# Patient Record
Sex: Female | Born: 1970 | Race: Black or African American | Hispanic: No | Smoking: Current every day smoker
Health system: Southern US, Community
[De-identification: ages and names within clinical notes are randomized; demographics above are authoritative.]

## PROBLEM LIST (undated history)

## (undated) DIAGNOSIS — M1990 Unspecified osteoarthritis, unspecified site: Secondary | ICD-10-CM

## (undated) DIAGNOSIS — J189 Pneumonia, unspecified organism: Secondary | ICD-10-CM

## (undated) DIAGNOSIS — M549 Dorsalgia, unspecified: Secondary | ICD-10-CM

## (undated) DIAGNOSIS — G8929 Other chronic pain: Secondary | ICD-10-CM

## (undated) DIAGNOSIS — E7800 Pure hypercholesterolemia, unspecified: Secondary | ICD-10-CM

## (undated) DIAGNOSIS — M109 Gout, unspecified: Secondary | ICD-10-CM

## (undated) DIAGNOSIS — G4730 Sleep apnea, unspecified: Secondary | ICD-10-CM

## (undated) DIAGNOSIS — I509 Heart failure, unspecified: Secondary | ICD-10-CM

## (undated) DIAGNOSIS — J40 Bronchitis, not specified as acute or chronic: Secondary | ICD-10-CM

## (undated) DIAGNOSIS — M2550 Pain in unspecified joint: Secondary | ICD-10-CM

## (undated) HISTORY — PX: FOOT SURGERY: SHX648

## (undated) HISTORY — PX: KNEE ARTHROSCOPY: SUR90

## (undated) HISTORY — DX: Pain in unspecified joint: M25.50

## (undated) HISTORY — PX: ABDOMINAL HYSTERECTOMY: SHX81

## (undated) HISTORY — DX: Sleep apnea, unspecified: G47.30

## (undated) HISTORY — PX: ANKLE SURGERY: SHX546

## (undated) HISTORY — PX: ORIF ANKLE FRACTURE: SUR919

## (undated) HISTORY — PX: FRACTURE SURGERY: SHX138

## (undated) HISTORY — DX: Other chronic pain: G89.29

## (undated) HISTORY — PX: CHOLECYSTECTOMY: SHX55

## (undated) HISTORY — DX: Pure hypercholesterolemia, unspecified: E78.00

## (undated) HISTORY — PX: KNEE SURGERY: SHX244

---

## 2005-09-27 ENCOUNTER — Ambulatory Visit: Payer: Self-pay | Admitting: *Deleted

## 2005-09-27 ENCOUNTER — Inpatient Hospital Stay (HOSPITAL_COMMUNITY): Admission: EM | Admit: 2005-09-27 | Discharge: 2005-09-27 | Payer: Self-pay | Admitting: Emergency Medicine

## 2006-06-19 ENCOUNTER — Emergency Department (HOSPITAL_COMMUNITY): Admission: EM | Admit: 2006-06-19 | Discharge: 2006-06-19 | Payer: Self-pay | Admitting: Emergency Medicine

## 2007-07-14 ENCOUNTER — Emergency Department (HOSPITAL_COMMUNITY): Admission: EM | Admit: 2007-07-14 | Discharge: 2007-07-14 | Payer: Self-pay | Admitting: Emergency Medicine

## 2008-09-05 ENCOUNTER — Other Ambulatory Visit: Admission: RE | Admit: 2008-09-05 | Discharge: 2008-09-05 | Payer: Self-pay | Admitting: Family Medicine

## 2008-10-24 ENCOUNTER — Inpatient Hospital Stay (HOSPITAL_COMMUNITY): Admission: EM | Admit: 2008-10-24 | Discharge: 2008-10-26 | Payer: Self-pay | Admitting: Emergency Medicine

## 2008-10-25 ENCOUNTER — Encounter (INDEPENDENT_AMBULATORY_CARE_PROVIDER_SITE_OTHER): Payer: Self-pay | Admitting: General Surgery

## 2008-11-14 ENCOUNTER — Encounter (INDEPENDENT_AMBULATORY_CARE_PROVIDER_SITE_OTHER): Payer: Self-pay | Admitting: Obstetrics and Gynecology

## 2008-11-14 ENCOUNTER — Inpatient Hospital Stay (HOSPITAL_COMMUNITY): Admission: RE | Admit: 2008-11-14 | Discharge: 2008-11-16 | Payer: Self-pay | Admitting: Obstetrics and Gynecology

## 2009-08-13 ENCOUNTER — Emergency Department (HOSPITAL_COMMUNITY): Admission: EM | Admit: 2009-08-13 | Discharge: 2009-08-13 | Payer: Self-pay | Admitting: Emergency Medicine

## 2010-09-10 LAB — COMPREHENSIVE METABOLIC PANEL
AST: 20 U/L (ref 0–37)
Albumin: 3.2 g/dL — ABNORMAL LOW (ref 3.5–5.2)
Alkaline Phosphatase: 66 U/L (ref 39–117)
BUN: 8 mg/dL (ref 6–23)
CO2: 22 mEq/L (ref 19–32)
Calcium: 8.8 mg/dL (ref 8.4–10.5)
Creatinine, Ser: 0.74 mg/dL (ref 0.4–1.2)
Glucose, Bld: 93 mg/dL (ref 70–99)
Potassium: 3.5 mEq/L (ref 3.5–5.1)

## 2010-09-10 LAB — CBC
HCT: 26.3 % — ABNORMAL LOW (ref 36.0–46.0)
HCT: 31.1 % — ABNORMAL LOW (ref 36.0–46.0)
Hemoglobin: 10.3 g/dL — ABNORMAL LOW (ref 12.0–15.0)
Hemoglobin: 8.7 g/dL — ABNORMAL LOW (ref 12.0–15.0)
MCHC: 32.9 g/dL (ref 30.0–36.0)
MCV: 78.1 fL (ref 78.0–100.0)
MCV: 79.1 fL (ref 78.0–100.0)
Platelets: 301 10*3/uL (ref 150–400)
RBC: 3.32 MIL/uL — ABNORMAL LOW (ref 3.87–5.11)
RDW: 17.1 % — ABNORMAL HIGH (ref 11.5–15.5)
WBC: 10.7 10*3/uL — ABNORMAL HIGH (ref 4.0–10.5)

## 2010-09-10 LAB — TYPE AND SCREEN: ABO/RH(D): O POS

## 2010-09-10 LAB — ABO/RH: ABO/RH(D): O POS

## 2010-09-11 LAB — DIFFERENTIAL
Basophils Relative: 0 % (ref 0–1)
Eosinophils Absolute: 0 10*3/uL (ref 0.0–0.7)
Lymphs Abs: 0.9 10*3/uL (ref 0.7–4.0)
Monocytes Absolute: 0.4 10*3/uL (ref 0.1–1.0)
Monocytes Relative: 2 % — ABNORMAL LOW (ref 3–12)

## 2010-09-11 LAB — URINALYSIS, ROUTINE W REFLEX MICROSCOPIC
Bilirubin Urine: NEGATIVE
Ketones, ur: NEGATIVE mg/dL
Specific Gravity, Urine: 1.023 (ref 1.005–1.030)
Urobilinogen, UA: 0.2 mg/dL (ref 0.0–1.0)

## 2010-09-11 LAB — CBC
MCV: 78.3 fL (ref 78.0–100.0)
Platelets: 340 10*3/uL (ref 150–400)
RBC: 4.19 MIL/uL (ref 3.87–5.11)
RDW: 16.3 % — ABNORMAL HIGH (ref 11.5–15.5)
WBC: 17.3 10*3/uL — ABNORMAL HIGH (ref 4.0–10.5)

## 2010-09-11 LAB — COMPREHENSIVE METABOLIC PANEL
AST: 28 U/L (ref 0–37)
Albumin: 3.4 g/dL — ABNORMAL LOW (ref 3.5–5.2)
BUN: 9 mg/dL (ref 6–23)
Creatinine, Ser: 0.85 mg/dL (ref 0.4–1.2)
GFR calc Af Amer: >60 mL/min (ref >60)
Glucose, Bld: 146 mg/dL — ABNORMAL HIGH (ref 70–99)
Total Bilirubin: 0.5 mg/dL (ref 0.3–1.2)

## 2010-09-11 LAB — URINE MICROSCOPIC-ADD ON

## 2010-09-11 LAB — HEMOCCULT GUIAC POC 1CARD (OFFICE): Fecal Occult Bld: POSITIVE

## 2010-10-01 ENCOUNTER — Other Ambulatory Visit (HOSPITAL_COMMUNITY)
Admission: RE | Admit: 2010-10-01 | Discharge: 2010-10-01 | Disposition: A | Discharge status: Home / Self Care | Payer: Self-pay | Source: Ambulatory Visit | Attending: Family Medicine | Admitting: Family Medicine

## 2010-10-01 ENCOUNTER — Other Ambulatory Visit: Payer: Self-pay | Admitting: Family Medicine

## 2010-10-01 DIAGNOSIS — Z01419 Encounter for gynecological examination (general) (routine) without abnormal findings: Secondary | ICD-10-CM | POA: Insufficient documentation

## 2010-10-16 NOTE — Op Note (Signed)
Kelsey Morgan, Kelsey Morgan              ACCOUNT NO.:  [id number]   MEDICAL RECORD NO.:  [id number]          PATIENT TYPE:  INP   LOCATION:  1406                         FACILITY:  [facility]   PHYSICIAN:  Angelia Mould. Derrell Lolling, M.D.DATE OF BIRTH:  [birth year]   DATE OF PROCEDURE:  10/25/2008  DATE OF DISCHARGE:                               OPERATIVE REPORT   PREOPERATIVE DIAGNOSIS:  Acute cholecystitis with cholelithiasis.   POSTOPERATIVE DIAGNOSIS:  Acute cholecystitis with cholelithiasis.   OPERATION PERFORMED:  Laparoscopic cholecystectomy with intraoperative  cholangiogram.   SURGEON:  Angelia Mould. Derrell Lolling, M.D.   OPERATIVE INDICATIONS:  This is a 40 year old African American female  who presented with a 24-hour history of right upper quadrant pain, but  no fever or chills.  She was diagnosed with ulcer disease in 2000 and  had upper endoscopy and takes Nexium and Prilosec.  She came to the  emergency room and was found to have abdominal tenderness.  Lab work  showed a white blood cell count of 17,300, but normal lipase and normal  liver function tests.  Ultrasound showed gallstones and a 2.5-cm  impacted stone with slightly thickened gallbladder wall.  She was  admitted in the middle of the night by Dr. Ovidio Kin.  He asked me to  assume her care.  I discussed this with the patient.  She was in  agreement with this.  We proposed proceeding with gallbladder surgery  today and she was in full agreement that, as she was still having pain.   OPERATIVE FINDINGS:  The gallbladder was acutely inflamed, was  edematous, and contained fairly clear bile.  There was a lot of  inflammatory edema throughout the gallbladder, as well in the  infundibulum.  The duodenum was adherent to the lower part of the  gallbladder, but these were soft adhesions and we were able to slowly  take these down without any problem whatsoever.  The liver, stomach,  duodenum, small intestine, large intestine, and the  peritoneal surfaces  looked normal.   OPERATIVE TECHNIQUE:  Following the induction of general endotracheal  anesthesia, the patient's abdomen was prepped and draped in a sterile  fashion.  Intravenous antibiotics had been given immediately preop.  The  patient was identified as correct patient and correct procedure.  0.5%  Marcaine with epinephrine was used as a local infiltration anesthetic.  A vertically oriented incision was made at the lower rim of the  umbilicus.  The fascia was incised in the midline and the abdominal  cavity entered under direct vision.  A 10-mm Hasson trocar was inserted  and secured with a pursestring suture of 0 Vicryl.  Pneumoperitoneum was  created.  Videocam was inserted with visualization and findings as  described above.  An 11-mm trocar was placed in the subxiphoid region  and two 5-mm trocars placed in the right mid abdomen.   The gallbladder was grasped, but it was fairly tense.  I used a suction  trocar to evacuate the bile from the gallbladder and this partially  collapsed the gallbladder, making it easier to manipulate.  We elevated  the gallbladder.  We slowly took down all of the adhesions to the lower  body and infundibulum of the gallbladder.  We were especially careful to  take down the adhesions of the duodenum and although these were soft  adhesions, they had to be taken down in small sections to avoid injury  to the duodenum.  We ultimately freed up the infundibulum completely and  could then visualize the area of the cystic duct and the cystic artery.  We carefully incised the peritoneum over the cystic duct and cystic  artery both anteriorly and posteriorly and skeletonized these  structures.  We isolated the cystic artery as it went onto the wall of  gallbladder, secured it with multiple metal clips, and divided it.  We  then had a very large window and a good critical view of the cystic  duct.  A cholangiogram catheter was inserted into  the cystic duct.  A  cholangiogram was obtained using the C-arm.  The cholangiogram showed  normal intrahepatic and extrahepatic bile ducts, no filling defect, and  no obstruction with good flow of contrast into the duodenum.  The  cholangiogram catheter was removed, the cystic duct secured with  multiple metal clips and divided.  The gallbladder was then dissected  from its bed with electrocautery, placed in a specimen bag, and removed  through the umbilical port.   The operative field was copiously irrigated.  We irrigated the fluid out  of the subphrenic space and the subphrenic space and it was completely  clear.  There was no bleeding nor any bile leak whatsoever at the  completion of the case.  The pneumoperitoneum was released.  The trocars  were removed.  The fascia at the umbilicus was closed with 0 Vicryl  sutures.  The skin incisions were closed with subcuticular sutures of 4-  0 Monocryl and Steri-Strips.  Clean bandages were placed and the patient  taken to the recovery room in stable condition.  Estimated blood loss  was about 20 mL.  Complications:  None.  Sponge, needle, and instrument  counts were correct.      Angelia Mould. Derrell Lolling, M.D.  Electronically Signed     HMI/MEDQ  D:  10/25/2008  T:  10/26/2008  Job:  [id number]   cc:   Deatra James, M.D.  Fax: [phone]

## 2010-10-16 NOTE — Op Note (Signed)
Kelsey Morgan, Kelsey Morgan              ACCOUNT NO.:  [id number]   MEDICAL RECORD NO.:  [id number]          PATIENT TYPE:  INP   LOCATION:  9306                          FACILITY:  [facility]   PHYSICIAN:  Charles A. Delcambre, MDDATE OF BIRTH:  01-08-1971   DATE OF PROCEDURE:  11/14/2008  DATE OF DISCHARGE:                               OPERATIVE REPORT   PREOPERATIVE DIAGNOSES:  1. Menorrhagia.  2. An 18-week uterine leiomyomata.   POSTOPERATIVE DIAGNOSES:  1. Menorrhagia.  2. An 18-week uterine leiomyomata.   PROCEDURE:  Transabdominal hysterectomy.   SURGEON:  Charles A. Sydnee Cabal, MD   ASSISTANTS:  Gerald Leitz, MD   ANESTHESIA:  General by endotracheal route.   FINDINGS:  Multiple uterine leiomyomata.  Normal tubes, ovaries, and  appendix.  Uterus multiple lobulated pedunculated into intramural  leiomyomata noted on specimen.  Uterus to pathology weighed in the  operating room, 677 g.   ESTIMATED BLOOD LOSS:  200 mL.  Instrument, sponge, and needle count  correct x2.  The uterus to Pathology.   DESCRIPTION OF PROCEDURE:  The patient was taken to the operating room,  placed supine position.  General anesthetic was induced without  difficulty.  Sterile prep and drape was undertaken.  Pfannenstiel  incision was made with the knife and carried down to the fascia.  Fascia  was incised with knife and Mayo scissors.  Rectus sheath were sharply  dissected superiorly and inferiorly.  Rectus muscles were bluntly  dissected in the midline.  Peritoneum was entered with a hemostat  without damage to bowel, bladder, or vascular structures.  Metzenbaum  scissors were used to take the incision superiorly and inferiorly  without damage to the bladder.  Balfour retractor was placed.  Cornual  regions of the uterus were grasped as the uterus was somewhat mobile.  Three laps were used to pack the bowel out the pelvis.  The round  ligaments were transected on either side and vesicouterine  peritoneum  was incised anteriorly.  Round ligaments were held.  Broad ligaments  were penetrated and the utero-ovarian pedicle was isolated, transected  to free tied with 0 Vicryl, and then transfixion stitched with 0 Vicryl  preserving the ovary.  This was done on the right side as well and the  bladder was taken down with sharp dissection and some blunt dissection  anteriorly.  This allowed the uterine vessels to be skeletonized and  they were taken with bleeding clamps on either side.  Further clamps  down to the remainder of the cardinal ligaments included the curved  Zeppelin clamp placed twice and this entered vagina.  Cervix was  amputated from the vagina with Mayo scissors.  Richardson angle sutures  x2 were placed at the vaginal angles.  The vaginal cuff closure was  undertaken with 0 Vicryl in a running locking suture.  Irrigation was  carried out.  Hemostasis was adequate after minor electrocautery in the  bladder edge.  Two figure-of-eight sutures of 2-0 Vicryl were placed on  the bladder to achieve hemostasis as well and the area was bleeding  where the dissection has had been  along the posterior cuff and this was  closed with 2-0 Vicryl transfixion stitches and figure-of-eights with 2-  0 Vicryl.  There was some bleeding from the right ovarian pedicle  beneath the pedicle and this was isolated and with 3 stitches of 2-0  Vicryl.  This area was of good hemostasis.  Irrigation was carried out a  total of 4 times and the subfascial hemostasis was excellent.  Minor  electrocautery was used anteriorly.  The fascia was then closed with #1  PDS running nonlocking suture.  Hemostasis was excellent and 0 plain gut  was used.  An interrupted sutures to place to close the subcutaneous  layer after irrigation was carried out.  Hemostasis was good.  Skin was  closed with sterile staples.  The patient was taken to recovery with  physician in attendance having tolerated the procedure well.   After  irrigation of the sutures, the retractors and laps were taken out,  please add that at that point and then closure ensued.      Charles A. Sydnee Cabal, MD  Electronically Signed     CAD/MEDQ  D:  11/14/2008  T:  11/15/2008  Job:  [id number]

## 2010-10-16 NOTE — H&P (Signed)
Kelsey Morgan, [name]              ACCOUNT NO.:  [id number]   MEDICAL RECORD NO.:  [id number]          PATIENT TYPE:  INP   LOCATION:  1406                         FACILITY:  [facility]   PHYSICIAN:  Sandria Bales. Ezzard Standing, M.D.  DATE OF BIRTH:  25-Oct-1970   DATE OF ADMISSION:  10/24/2008  DATE OF DISCHARGE:                              HISTORY & PHYSICAL   Date of Admission ?   PRIMARY CARE DOCTOR:  Deatra James.   HISTORY OF ILLNESS:  Ms. Kelsey Morgan is a 40 year old black  female patient of Deatra James, she has had about a 24 hours history of  significant epigastric right upper quadrant pain with some vomiting.  She came to the [facility] Emergency Room at approximately 11:00 a.m.  this morning.  Her evaluation has revealed a probable impacted gallstone  in the neck of her gallbladder with a thickened gallbladder wall and  elevated white blood count, all consistent with probably acute  cholecystitis.   The patient has a remote history of ulcer disease.  She says she  underwent upper endoscopy in about 2000.  She has tried both Nexium and  Prilosec to control her ulcer disease.  She can not remember the name of  the doctor who did the endoscopy.  She had some appointments to see GI  doctors in the past for some recurrent epigastric pain but every time  she felt better she cancelled the appointment.   She denies any known history of liver disease, pancreatic disease, colon  disease and she has never had any prior abdominal surgery.   She is scheduled to see Dr. __________ tomorrow for consideration of  hysterectomy.   PAST MEDICAL HISTORY:  SHE HAS NO ALLERGIES.   CURRENT MEDICATIONS:  Prilosec, and her sister had some Phenergan that  she was given but she is on no other chronic medicine.   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   PULMONARY:  Has no pneumonia or  tuberculosis, does not smoke cigarettes.  CARDIAC:  She has had no heart disease, chest pain or hypertension or  cardiac catheterization evaluation.  GASTROINTESTINAL:  See history of present illness.  RENAL: No history of  kidney infections or kidney stones.   GYN:  She has no children and she is considering having a hysterectomy.   PERSONAL HISTORY:  Her mother, father and sister are in the room with  her.  I did her sister's gallbladder surgery about 3 or 4 years ago and  she works as a Naval architect.   PHYSICAL EXAMINATION:  Her temperature is 98.1, blood pressure 123/76,  pulse is 73.  GENERAL:  She is a well-nourished, mildly obese black female who is  cooperative on exam.  HEENT:  Unremarkable.  NECK:  Supple, I found no mass or thyromegaly.  LYMPH NODES: She has no palpable lymph nodes, no cervical or  supraclavicular adenopathy.  LUNGS:  Show symmetric breath sounds.  I did not  examine her breasts.  HEART:  Regular rate and rhythm, I hear no murmur or rub.  ABDOMEN:  She is tender in the right upper quadrant with some guarding.  She has present but decreased bowel sounds.  She has no abdominal mass,  no hernia that I can feel.  EXTREMITIES:  Has good strength in all 4 extremities.  NEUROLOGIC:  Is grossly intact to motor and sensory functions.   I reviewed her ultrasound which does appear to show a 2.5 centimeter  stone impacted in the neck of the gallbladder with thickened gallbladder  wall.   Her urinalysis was negative.   Her serum pregnancy negative.   Her lipase was 10.   Her white blood count was 17,300 with 92% neutrophils, her hemoglobin  was 10, hematocrit 32, her sodium was 140, potassium 3.5, chloride of  109, CO2 of 20, glucose of 146, BUN of 9, creatinine 0.85, her SGPT was  13, SGOT was 28.   IMPRESSION:  1. Acute cholecystitis with cholelithiasis.  I discussed with the      patient about gallbladder surgery, I  discussed the indications,      potential complications.  Potential complications include, but are      not limited to, bleeding, infection, common bile duct injury and      the possibility of open surgery.  I told her we do not know until      we get going about how long she will be in the hospital or her      postoperative course because of the uncertainty of the severity of      the illness and the type of surgery she will get.   I am on call tonight but tomorrow my partner is coming on.  I will  probably have my partner do the surgery since I will have been  up much  of this night.  I think she understands this with the indications of her surgery and who  will be doing the surgery.   1. History of stress induced stroke, she is 100% recovered.  2. Anticipating hysterectomy.      Sandria Bales. Ezzard Standing, M.D.  Electronically Signed     DHN/MEDQ  D:  10/24/2008  T:  10/25/2008  Job:  [id number]   cc:   Deatra James, M.D.  Fax: [phone]

## 2010-10-19 NOTE — Cardiovascular Report (Signed)
Kelsey Morgan, Kelsey Morgan              ACCOUNT NO.:  [id number]   MEDICAL RECORD NO.:  [id number]          PATIENT TYPE:  INP   LOCATION:  1831                         FACILITY:  [facility]   PHYSICIAN:  Salvadore Farber, M.D. LHCDATE OF BIRTH:  1970-08-15   DATE OF PROCEDURE:  09/27/2005  DATE OF DISCHARGE:                              CARDIAC CATHETERIZATION   PROCEDURE:  1.  Left heart catheterization.  2.  Left ventriculography.  3.  Coronary angiography.  4.  StarClose closure of the right common femoral arteriotomy site.   INDICATIONS:  [name] is a 40 year old woman without prior history of  cardiovascular disease and no risk factors who presents with acute onset  chest discomfort at 9:30 p.m.  Pain persisted at 10 out of 10 prompting  presentation to the [facility] emergency Room.  There, electrocardiogram  demonstrated an absence of R-wave progression across the anterior wall and  0.5 mm to 1 mm ST elevations in V1-V5.  It was not clear that she was having  a myocardial infarction, decision was made to proceed to urgent  catheterization for definitive exclusion of myocardial infarction as the  etiology of her chest pain and abnormal electrocardiogram.   PROCEDURAL TECHNIQUE:  Informed consent was obtained.  Under 1% lidocaine  local anesthesia, a 6-French sheath placed in the right common femoral  artery using the modified Seldinger technique.  Diagnostic angiography and  ventriculography were performed using JL4, JR4, and pigtail catheters.  Pigtail catheter was then pulled back to the aortic root and root  aortography performed by power injection.  The arteriotomy was then closed  using a StarClose device.  Complete hemostasis was obtained.  She was  transferred to the holding room in stable condition.   COMPLICATIONS:  None.   FINDINGS:  1.  LV:  114/13/20.  EF 65% without regional wall motion abnormality.  2.  No aortic stenosis or mitral regurgitation.  3.  Left  main:  Angiographically normal.  4.  LAD:  Moderate-sized vessel giving rise to single diagonal.  It is      angiographically normal.  5.  Ramus intermedius:  Large vessel which is angiographically normal.  6.  Circumflex:  Small vessel which is angiographically normal.  7.  RCA:  Moderate-sized dominant vessel which is angiographically normal.  8.  Ascending aorta:  Appears to have bovine origin of the great vessels      with the left vertebral arising directly from the arch.  There is no      evidence of aneurysm or dissection.   IMPRESSION/PLAN:  Patient has angiographically normal coronary arteries,  normal left ventricular size and systolic function, and normal ascending  aorta.  There is no clear explanation for her chest discomfort based on this  study.  Will check D-dimer, though clinical likelihood of pulmonary embolus  is very low.  She will be admitted to the hospital and observed closely.      Salvadore Farber, M.D. [facility]  Electronically Signed     WED/MEDQ  D:  09/27/2005  T:  09/27/2005  Job:  [id number]

## 2010-10-19 NOTE — H&P (Signed)
NAMEBIRGIT, Kelsey Morgan              ACCOUNT NO.:  [id number]   MEDICAL RECORD NO.:  [id number]          PATIENT TYPE:  INP   LOCATION:  2920                         FACILITY:  [facility]   PHYSICIAN:  Vida Roller, M.D.   DATE OF BIRTH:  12/04/1970   DATE OF ADMISSION:  09/27/2005  DATE OF DISCHARGE:                                HISTORY & PHYSICAL   CHIEF COMPLAINT:  Chest pain.   HISTORY OF PRESENT ILLNESS:  This is a 40 year old woman with no past  medical history.  ___________ was in church today __________ but she had the  sudden onset of chest pain at 9:30.  She ignored the pain for approximately  4 hours, however, it remained persistent at 10/10 with associated shortness  of breath, nausea and vomiting.  Finally, at that point she called [facility] and was brought to the emergency room where she was found to have ST  segment elevation in V2 and V3 and complete loss of electrical forces.   PAST MEDICAL HISTORY:  None.   ALLERGIES:  None.   CURRENT MEDICATIONS:  None.   SOCIAL HISTORY:  No tobacco, no alcohol.  No drug use.   REVIEW OF SYSTEMS:  Otherwise unremarkable.   PHYSICAL EXAMINATION:  VITAL SIGNS:  Blood pressure 114/70, pulse 84,  respiratory 18, blood pressure 98/71.  GENERAL:  She is alert and in no apparent distress.  NECK:  Supple.  LUNGS:  Clear.  HEART:  Regular with normal first and second heart sounds.  ABDOMEN:  Soft, nontender.  Positive bowel sounds.  EXTREMITIES:  No cyanosis, clubbing or edema.  NEUROLOGICAL:  Exam is nonfocal.  RECTAL:  Exam is negative for __________.   LABORATORY DATA:  Repeat EKG and point of care cardiac enzymes were pending  .   ASSESSMENT AND PLAN:  This is a 40 year old woman with chest pain with  concern for acute coronary syndrome.  We will proceed with cardiac  catheterization.  Should the cardiac catheterization be negative then other  etiologies of chest pain including musculoskeletal disease, GERD, will  need  to be evaluated.     ______________________________  Denyse Amass, MD      Vida Roller, M.D.  Electronically Signed    DBH/MEDQ  D:  09/27/2005  T:  09/27/2005  Job:  [id number]
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
## 2010-10-19 NOTE — Discharge Summary (Signed)
Kelsey Morgan, Kelsey Morgan              ACCOUNT NO.:  [id number]   MEDICAL RECORD NO.:  [id number]          PATIENT TYPE:  INP   LOCATION:  2920                         FACILITY:  [facility]   PHYSICIAN:  Salvadore Farber, M.D. LHCDATE OF BIRTH:  [birth year]   DATE OF ADMISSION:  09/27/2005  DATE OF DISCHARGE:  09/27/2005                                 DISCHARGE SUMMARY   PROCEDURES:  1.  Cardiac catheterization.  2.  Coronary arteriogram.  3.  Left ventriculogram.  4.  CT of the chest with contrast.   Time at discharge:  Twenty four minutes.   PRIMARY DIAGNOSIS:  Chest pain.   Dictation ended at this point.      Theodore Demark, P.A. [facility]      Salvadore Farber, M.D. [facility]  Electronically Signed    RB/MEDQ  D:  09/27/2005  T:  09/28/2005  Job:  [id number]

## 2010-12-17 ENCOUNTER — Emergency Department (HOSPITAL_COMMUNITY)
Admission: EM | Admit: 2010-12-17 | Discharge: 2010-12-17 | Disposition: A | Discharge status: Home / Self Care | Payer: BC Managed Care – PPO | Attending: Emergency Medicine | Admitting: Emergency Medicine

## 2010-12-17 DIAGNOSIS — J029 Acute pharyngitis, unspecified: Secondary | ICD-10-CM | POA: Insufficient documentation

## 2010-12-17 DIAGNOSIS — F3289 Other specified depressive episodes: Secondary | ICD-10-CM | POA: Insufficient documentation

## 2010-12-17 DIAGNOSIS — R509 Fever, unspecified: Secondary | ICD-10-CM | POA: Insufficient documentation

## 2010-12-17 DIAGNOSIS — F329 Major depressive disorder, single episode, unspecified: Secondary | ICD-10-CM | POA: Insufficient documentation

## 2010-12-17 DIAGNOSIS — R05 Cough: Secondary | ICD-10-CM | POA: Insufficient documentation

## 2010-12-17 DIAGNOSIS — R059 Cough, unspecified: Secondary | ICD-10-CM | POA: Insufficient documentation

## 2010-12-17 DIAGNOSIS — J40 Bronchitis, not specified as acute or chronic: Secondary | ICD-10-CM | POA: Insufficient documentation

## 2011-02-22 LAB — DIFFERENTIAL
Basophils Absolute: 0
Basophils Relative: 0
Eosinophils Absolute: 0.3
Eosinophils Relative: 2
Lymphocytes Relative: 21
Lymphs Abs: 2.7
Monocytes Absolute: 0.7
Monocytes Relative: 5
Neutro Abs: 9.1 — ABNORMAL HIGH
Neutrophils Relative %: 71

## 2011-02-22 LAB — CBC
HCT: 35.2 — ABNORMAL LOW
Hemoglobin: 11.7 — ABNORMAL LOW
MCHC: 33.3
MCV: 82.5
RBC: 4.26

## 2011-02-22 LAB — COMPREHENSIVE METABOLIC PANEL
ALT: 8
AST: 17
Albumin: 3.1 — ABNORMAL LOW
Alkaline Phosphatase: 69
BUN: 7
CO2: 24
Calcium: 8.4
Chloride: 105
Creatinine, Ser: 0.85
GFR calc Af Amer: >60
GFR calc non Af Amer: >60
Glucose, Bld: 87
Potassium: 3.6
Sodium: 135
Total Bilirubin: 0.3
Total Protein: 6.7

## 2011-02-22 LAB — URINE MICROSCOPIC-ADD ON

## 2011-02-22 LAB — LIPASE, BLOOD: Lipase: 14

## 2011-02-22 LAB — URINALYSIS, ROUTINE W REFLEX MICROSCOPIC
Protein, ur: NEGATIVE
pH: 7

## 2011-10-22 ENCOUNTER — Other Ambulatory Visit: Payer: Self-pay | Admitting: Family Medicine

## 2011-10-22 DIAGNOSIS — R202 Paresthesia of skin: Secondary | ICD-10-CM

## 2011-10-22 DIAGNOSIS — M542 Cervicalgia: Secondary | ICD-10-CM

## 2011-11-08 ENCOUNTER — Other Ambulatory Visit: Payer: Self-pay | Admitting: Family Medicine

## 2011-11-08 DIAGNOSIS — M5410 Radiculopathy, site unspecified: Secondary | ICD-10-CM

## 2011-11-11 ENCOUNTER — Other Ambulatory Visit: Payer: BC Managed Care – PPO

## 2011-11-18 ENCOUNTER — Other Ambulatory Visit: Payer: BC Managed Care – PPO

## 2011-11-25 ENCOUNTER — Inpatient Hospital Stay: Admission: RE | Admit: 2011-11-25 | Payer: BC Managed Care – PPO | Source: Ambulatory Visit

## 2011-11-25 ENCOUNTER — Other Ambulatory Visit: Payer: BC Managed Care – PPO

## 2012-06-15 ENCOUNTER — Emergency Department (HOSPITAL_COMMUNITY)
Admission: EM | Admit: 2012-06-15 | Discharge: 2012-06-15 | Disposition: A | Discharge status: Home / Self Care | Payer: BC Managed Care – PPO | Attending: Emergency Medicine | Admitting: Emergency Medicine

## 2012-06-15 ENCOUNTER — Emergency Department (HOSPITAL_COMMUNITY): Payer: BC Managed Care – PPO

## 2012-06-15 ENCOUNTER — Encounter (HOSPITAL_COMMUNITY): Payer: Self-pay | Admitting: Emergency Medicine

## 2012-06-15 DIAGNOSIS — M549 Dorsalgia, unspecified: Secondary | ICD-10-CM

## 2012-06-15 DIAGNOSIS — M545 Low back pain, unspecified: Secondary | ICD-10-CM | POA: Insufficient documentation

## 2012-06-15 DIAGNOSIS — M5430 Sciatica, unspecified side: Secondary | ICD-10-CM | POA: Insufficient documentation

## 2012-06-15 DIAGNOSIS — Z79899 Other long term (current) drug therapy: Secondary | ICD-10-CM | POA: Insufficient documentation

## 2012-06-15 DIAGNOSIS — F17200 Nicotine dependence, unspecified, uncomplicated: Secondary | ICD-10-CM | POA: Insufficient documentation

## 2012-06-15 MED ORDER — HYDROMORPHONE HCL 4 MG PO TABS: 2.0000 mg | ORAL_TABLET | Freq: Four times a day (QID) | ORAL | Status: DC | PRN

## 2012-06-15 MED ORDER — HYDROMORPHONE HCL PF 2 MG/ML IJ SOLN
2.0000 mg | Freq: Once | INTRAMUSCULAR | Status: AC
  Administered 2012-06-15: 2 mg via INTRAMUSCULAR
  Filled 2012-06-15: qty 1

## 2012-06-15 MED ORDER — METHOCARBAMOL 750 MG PO TABS: 750.0000 mg | ORAL_TABLET | Freq: Four times a day (QID) | ORAL | Status: DC

## 2012-06-15 MED ORDER — MELOXICAM 15 MG PO TABS: 15.0000 mg | ORAL_TABLET | Freq: Every day | ORAL | Status: DC
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
## 2012-06-22 NOTE — ED Provider Notes (Signed)
Medical screening examination/treatment/procedure(s) were performed by non-physician practitioner and as supervising physician I was immediately available for consultation/collaboration.   Richardean Canal, MD 06/22/12 [phone]

## 2013-03-13 ENCOUNTER — Encounter (HOSPITAL_COMMUNITY): Payer: Self-pay | Admitting: Emergency Medicine

## 2013-03-13 ENCOUNTER — Emergency Department (HOSPITAL_COMMUNITY)
Admission: EM | Admit: 2013-03-13 | Discharge: 2013-03-13 | Disposition: A | Discharge status: Home / Self Care | Payer: BC Managed Care – PPO | Attending: Emergency Medicine | Admitting: Emergency Medicine

## 2013-03-13 ENCOUNTER — Emergency Department (HOSPITAL_COMMUNITY): Payer: BC Managed Care – PPO

## 2013-03-13 DIAGNOSIS — R197 Diarrhea, unspecified: Secondary | ICD-10-CM | POA: Insufficient documentation

## 2013-03-13 DIAGNOSIS — R509 Fever, unspecified: Secondary | ICD-10-CM | POA: Insufficient documentation

## 2013-03-13 DIAGNOSIS — R112 Nausea with vomiting, unspecified: Secondary | ICD-10-CM | POA: Insufficient documentation

## 2013-03-13 DIAGNOSIS — R05 Cough: Secondary | ICD-10-CM

## 2013-03-13 DIAGNOSIS — J40 Bronchitis, not specified as acute or chronic: Secondary | ICD-10-CM

## 2013-03-13 DIAGNOSIS — R5381 Other malaise: Secondary | ICD-10-CM | POA: Insufficient documentation

## 2013-03-13 DIAGNOSIS — Z79899 Other long term (current) drug therapy: Secondary | ICD-10-CM | POA: Insufficient documentation

## 2013-03-13 DIAGNOSIS — F17200 Nicotine dependence, unspecified, uncomplicated: Secondary | ICD-10-CM | POA: Insufficient documentation

## 2013-03-13 DIAGNOSIS — E669 Obesity, unspecified: Secondary | ICD-10-CM | POA: Insufficient documentation

## 2013-03-13 LAB — CBC WITH DIFFERENTIAL/PLATELET
Eosinophils Relative: 2 % (ref 0–5)
HCT: 42 % (ref 36.0–46.0)
Lymphocytes Relative: 27 % (ref 12–46)
Lymphs Abs: 4.5 10*3/uL — ABNORMAL HIGH (ref 0.7–4.0)
MCV: 81.7 fL (ref 78.0–100.0)
Monocytes Relative: 7 % (ref 3–12)
Platelets: 380 10*3/uL (ref 150–400)
RBC: 5.14 MIL/uL — ABNORMAL HIGH (ref 3.87–5.11)
WBC: 16.6 10*3/uL — ABNORMAL HIGH (ref 4.0–10.5)

## 2013-03-13 LAB — POCT I-STAT, CHEM 8
Chloride: 108 mEq/L (ref 96–112)
Glucose, Bld: 101 mg/dL — ABNORMAL HIGH (ref 70–99)
HCT: 45 % (ref 36.0–46.0)
Hemoglobin: 15.3 g/dL — ABNORMAL HIGH (ref 12.0–15.0)
Potassium: 3.5 mEq/L (ref 3.5–5.1)
Sodium: 142 mEq/L (ref 135–145)

## 2013-03-13 MED ORDER — SODIUM CHLORIDE 0.9 % IV SOLN
Freq: Once | INTRAVENOUS | Status: AC
  Administered 2013-03-13: 22:00:00 via INTRAVENOUS

## 2013-03-13 MED ORDER — FLUCONAZOLE 150 MG PO TABS: 150.0000 mg | ORAL_TABLET | Freq: Once | ORAL | Status: DC

## 2013-03-13 MED ORDER — ALBUTEROL SULFATE HFA 108 (90 BASE) MCG/ACT IN AERS
2.0000 | INHALATION_SPRAY | Freq: Once | RESPIRATORY_TRACT | Status: AC
  Administered 2013-03-13: 2 via RESPIRATORY_TRACT
  Filled 2013-03-13: qty 6.7

## 2013-03-13 MED ORDER — PREDNISONE 20 MG PO TABS
60.0000 mg | ORAL_TABLET | Freq: Once | ORAL | Status: AC
  Administered 2013-03-13: 60 mg via ORAL
  Filled 2013-03-13: qty 3

## 2013-03-13 MED ORDER — IOHEXOL 350 MG/ML SOLN
100.0000 mL | Freq: Once | INTRAVENOUS | Status: AC | PRN
  Administered 2013-03-13: 100 mL via INTRAVENOUS

## 2013-03-13 MED ORDER — ALBUTEROL SULFATE (5 MG/ML) 0.5% IN NEBU
2.5000 mg | INHALATION_SOLUTION | Freq: Once | RESPIRATORY_TRACT | Status: AC
  Administered 2013-03-13: 2.5 mg via RESPIRATORY_TRACT
  Filled 2013-03-13: qty 0.5

## 2013-03-13 MED ORDER — ONDANSETRON HCL 4 MG/2ML IJ SOLN
4.0000 mg | Freq: Once | INTRAMUSCULAR | Status: AC
  Administered 2013-03-13: 4 mg via INTRAVENOUS
  Filled 2013-03-13: qty 2

## 2013-03-13 MED ORDER — AZITHROMYCIN 250 MG PO TABS: ORAL_TABLET | ORAL | Status: DC

## 2013-03-13 MED ORDER — HYDROCOD POLST-CHLORPHEN POLST 10-8 MG/5ML PO LQCR: 5.0000 mL | Freq: Every evening | ORAL | Status: DC | PRN

## 2013-03-13 MED ORDER — IPRATROPIUM BROMIDE 0.02 % IN SOLN
0.5000 mg | Freq: Once | RESPIRATORY_TRACT | Status: AC
  Administered 2013-03-13: 0.5 mg via RESPIRATORY_TRACT
  Filled 2013-03-13: qty 2.5

## 2013-03-13 NOTE — ED Provider Notes (Signed)
CSN: [id number]     Arrival date & time 03/13/13  2026 History   First MD Initiated Contact with Patient 03/13/13 2053     Chief Complaint  Patient presents with  . Cough  . Shortness of Breath   (Consider location/radiation/quality/duration/timing/severity/associated sxs/prior Treatment) HPI Comments: 42 year old female with no significant past medical history presents to the emergency department complaining of cough and congestion x4 weeks, worsening over the past 4 days. Patient states her symptoms began with nasal congestion, and throughout the month moved into her chest, she has been trying to take Mucinex without relief. She feels as if there is weakness in her chest, however she is unable to cough anything up. Admits to associated shortness of breath both at rest and on exertion, subjective fever, chills, nausea, non-bloody, non-bilious vomiting and diarrhea. States she just generally does not feel well. Last episode of vomiting was this morning. Denies any sick contacts. 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 Denies any calf pain or swelling. No history of DVT, blood clots or asthma. She is a smoker.  Patient is a 42 y.o. female presenting with cough and shortness of breath. The history is provided by the patient and a significant other.  Cough Associated symptoms: chills, fever and shortness of breath   Associated symptoms: no chest pain and no rash   Shortness of Breath Associated symptoms: cough, fever and vomiting   Associated symptoms: no chest pain and no rash     History reviewed. No pertinent past medical history. Past Surgical History  Procedure Laterality Date  . Cholecystectomy    . Abdominal hysterectomy    . Ankle surgery    . Knee surgery     No family history on file. History  Substance Use Topics  . Smoking status: Current Every Day Smoker -- 1.00 packs/day    Types: Cigarettes  . Smokeless tobacco: Not on file  . Alcohol Use: No   OB History    Grav Para Term Preterm Abortions TAB SAB Ect Mult Living                 Review of Systems  Constitutional: Positive for fever, chills and fatigue.  HENT: Positive for congestion.   Respiratory: Positive for cough, chest tightness and shortness of breath.   Cardiovascular: Negative for chest pain.  Gastrointestinal: Positive for nausea, vomiting and diarrhea.  Skin: Negative for rash.  All other systems reviewed and are negative.    Allergies  Percocet and Vicodin  Home Medications   Current Outpatient Rx  Name  Route  Sig  Dispense  Refill  . acetaminophen (TYLENOL) 500 MG tablet   Oral   Take 500 mg by mouth every 6 (six) hours as needed for pain.         Marland Kitchen azelastine (ASTEPRO) 137 MCG/SPRAY nasal spray   Nasal   Place 1 spray into the nose daily. Use in each nostril as directed         . guaiFENesin (MUCINEX) 600 MG 12 hr tablet   Oral   Take 1,200 mg by mouth 2 (two) times daily.         Marland Kitchen guaifenesin (ROBITUSSIN) 100 MG/5ML syrup   Oral   Take 200 mg by mouth 3 (three) times daily as needed for cough.         . Pseudoephedrine-APAP-DM (DAYQUIL MULTI-SYMPTOM PO)   Oral   Take 1 capsule by mouth every 6 (six) hours as needed.         Marland Kitchen  sertraline (ZOLOFT) 100 MG tablet   Oral   Take 100 mg by mouth daily.         . traMADol (ULTRAM) 50 MG tablet   Oral   Take 50 mg by mouth every 6 (six) hours as needed for pain.          BP 148/89  Pulse 83  Temp(Src) 98.4 F (36.9 C) (Oral)  Resp 20  SpO2 97% Physical Exam  Nursing note and vitals reviewed. Constitutional: She is oriented to person, place, and time. She appears well-developed and well-nourished. No distress.  Obese  HENT:  Head: Normocephalic and atraumatic.  Mouth/Throat: Uvula is midline and mucous membranes are normal. Posterior oropharyngeal erythema present. No oropharyngeal exudate or posterior oropharyngeal edema.  Eyes: Conjunctivae are normal.  Neck: Normal range of  motion. Neck supple.  Cardiovascular: Normal rate, regular rhythm and normal heart sounds.   Pulmonary/Chest: Effort normal. No accessory muscle usage. No respiratory distress.  Harsh cough present. Scattered expiratory> expiratory wheezes and rhonchi.  Abdominal: Soft. Bowel sounds are normal. There is no tenderness.  Musculoskeletal: Normal range of motion. She exhibits no edema.  Neurological: She is alert and oriented to person, place, and time.  Skin: Skin is warm and dry. No rash noted. She is not diaphoretic.  Psychiatric: She has a normal mood and affect. Her behavior is normal.    ED Course  Procedures (including critical care time) Labs Review Labs Reviewed  POCT I-STAT, CHEM 8 - Abnormal; Notable for the following:    BUN 4 (*)    Glucose, Bld 101 (*)    Hemoglobin 15.3 (*)    All other components within normal limits  CBC WITH DIFFERENTIAL  D-DIMER, QUANTITATIVE   Imaging Review Dg Chest 2 View  03/13/2013   CLINICAL DATA:  Cough, shortness of breath.  EXAM: CHEST  2 VIEW  COMPARISON:  September 27, 2005.  FINDINGS: The heart size and mediastinal contours are within normal limits. Bilateral peribronchial thickening is noted consistent with bronchiolitis or asthma. The visualized skeletal structures are unremarkable.  IMPRESSION: Bilateral peribronchial thickening consistent with bronchiolitis or asthma.   Electronically Signed   By: Roque Lias M.D.   On: 03/13/2013 21:07   Ct Angio Chest Pe W/cm &/or Wo Cm  03/13/2013   CLINICAL DATA:  Cough and chest congestion. Shortness of breath. Chest pain.  EXAM: CT ANGIOGRAPHY CHEST WITH CONTRAST  TECHNIQUE: Multidetector CT imaging of the chest was performed using the standard protocol during bolus administration of intravenous contrast. Multiplanar CT image reconstructions including MIPs were obtained to evaluate the vascular anatomy.  CONTRAST:  OMNIPAQUE IOHEXOL 350 MG/ML SOLN  COMPARISON:  Chest radiographs obtained earlier  today and chest CTA dated 09/27/2005.  FINDINGS: Normally opacified pulmonary arteries with no pulmonary arterial filling defects seen. Mild bilateral bullous changes. Small left lower lobe nodule at the left lateral costophrenic angle, measuring 10 x 7 mm on image number 66. This is unchanged. No new lung nodules.  Interval mildly enlarged mediastinal and bilateral hilar lymph nodes. These include a 15 mm short axis right hilar lymph node on image number 34 of series 4, an 8 mm short axis left hilar lymph node on image number 40 of series 4 and a 14 mm periaortic lymph node lateral to the aortic arch on image number 24 of series 4. There is also a prominent subcarinal node with a short axis diameter of 20 mm on image number 33 of series 4.  The upper abdomen is unremarkable. Mild thoracic spine degenerative changes.  Review of the MIP images confirms the above findings.  IMPRESSION: 1. No pulmonary emboli seen. 2. Interval mild mediastinal and bilateral hilar adenopathy. 3. Mild changes of COPD. 4. Stable left lower lobe lung nodule. The long-term stability is compatible with a benign process.   Electronically Signed   By: Gordan Payment M.D.   On: 03/13/2013 23:13    EKG Interpretation   None       MDM   1. Bronchitis   2. Cough     Patient with cough, congestion and shortness of breath. Patient evaluated by triage provider prior to being seen by myself, chest x-ray ordered, CBC, chem 8 and d-dimer ordered. She was also given a DuoNeb treatment. Chest x-ray showing bilateral peribronchial thickening consistent with bronchiolitis or asthma, she has scattered wheezes and rhonchi on exam. Leukocytosis of 16.6. D-dimer pending. She reports some improvement after DuoNeb. Will give 60 mg prednisone. Doubt PE, but given shortness of breath and her job, d-dimer is appropriate. She is afebrile and in no apparent distress, O2 sat 97% on room air. 10:15 PM D-dimer elevated. Will obtain CT angio of chest. She is  feeling nauseated, Zofran for nausea. 11:27 PM CT negative for pulmonary emboli. Her nausea has improved. Given that she is a smoker, I will treat her for bronchitis with azithromycin. Will also give albuterol inhaler and prescription for Tussionex. She will followup with her PCP. She is stable for discharge. Return precautions discussed. Patient states understanding of treatment care plan and is agreeable.   Trevor Mace, PA-C 03/13/13 2328

## 2013-03-13 NOTE — ED Provider Notes (Signed)
MSE was initiated and I personally evaluated the patient and placed orders (if any) at  9:13 PM on March 13, 2013.  The patient appears stable so that the remainder of the MSE may be completed by another provider. Ms. Kelsey Morgan is a pleasant, morbidly obese, 42 year old, African American female, who is a Marine scientist.  Reports, that for the past, month.  She's had chest congestion, cough, feeling, that she can't get a deep breath, or bring up any mucus.  She been taking numerous over-the-counter medications without relief in the past several, days.  She's also noticed subjective fever, vomiting, and diarrhea.  She does smoke heavily, sit for long periods of time, denies lower leg swelling.  History of DVT but this is a concern due to her occupation and symptoms. She removed to the main, ED I've ordered that she get IV fluids, lab for including a d-dimer, albuterol, Atrovent , nebulizer treatment and further evaluation.  Arman Filter, NP 03/14/13 1958

## 2013-03-13 NOTE — ED Notes (Signed)
Pt states that she has had cough and congestion that began 4 weeks ago; pt states that she began with nasal congestion and that it has gone to her chest; pt states that she can feel "mucous" when she coughs but is unable to cough anything up; pt c/o feeling short of breath; pt also c/o ribs sore from coughing.

## 2013-03-14 NOTE — ED Provider Notes (Signed)
Medical screening examination/treatment/procedure(s) were performed by non-physician practitioner and as supervising physician I was immediately available for consultation/collaboration.    Stafford Riviera D Jahsiah Carpenter, MD 03/14/13 2251 

## 2013-03-16 NOTE — ED Provider Notes (Signed)
Medical screening examination/treatment/procedure(s) were performed by non-physician practitioner and as supervising physician I was immediately available for consultation/collaboration.   Richardean Canal, MD 03/16/13 [phone]

## 2013-09-26 ENCOUNTER — Emergency Department (HOSPITAL_COMMUNITY)
Admission: EM | Admit: 2013-09-26 | Discharge: 2013-09-26 | Disposition: A | Discharge status: Home / Self Care | Payer: BC Managed Care – PPO | Attending: Emergency Medicine | Admitting: Emergency Medicine

## 2013-09-26 ENCOUNTER — Emergency Department (HOSPITAL_COMMUNITY): Payer: BC Managed Care – PPO

## 2013-09-26 ENCOUNTER — Encounter (HOSPITAL_COMMUNITY): Payer: Self-pay | Admitting: Emergency Medicine

## 2013-09-26 DIAGNOSIS — Z8701 Personal history of pneumonia (recurrent): Secondary | ICD-10-CM | POA: Insufficient documentation

## 2013-09-26 DIAGNOSIS — Z79899 Other long term (current) drug therapy: Secondary | ICD-10-CM | POA: Insufficient documentation

## 2013-09-26 DIAGNOSIS — Z8739 Personal history of other diseases of the musculoskeletal system and connective tissue: Secondary | ICD-10-CM | POA: Insufficient documentation

## 2013-09-26 DIAGNOSIS — B9789 Other viral agents as the cause of diseases classified elsewhere: Secondary | ICD-10-CM | POA: Insufficient documentation

## 2013-09-26 DIAGNOSIS — J988 Other specified respiratory disorders: Secondary | ICD-10-CM

## 2013-09-26 DIAGNOSIS — F17200 Nicotine dependence, unspecified, uncomplicated: Secondary | ICD-10-CM | POA: Insufficient documentation

## 2013-09-26 DIAGNOSIS — R0789 Other chest pain: Secondary | ICD-10-CM | POA: Insufficient documentation

## 2013-09-26 DIAGNOSIS — J3489 Other specified disorders of nose and nasal sinuses: Secondary | ICD-10-CM | POA: Insufficient documentation

## 2013-09-26 DIAGNOSIS — R1110 Vomiting, unspecified: Secondary | ICD-10-CM | POA: Insufficient documentation

## 2013-09-26 HISTORY — DX: Dorsalgia, unspecified: M54.9

## 2013-09-26 HISTORY — DX: Pneumonia, unspecified organism: J18.9

## 2013-09-26 HISTORY — DX: Bronchitis, not specified as acute or chronic: J40

## 2013-09-26 MED ORDER — ALBUTEROL SULFATE (2.5 MG/3ML) 0.083% IN NEBU: 2.5000 mg | INHALATION_SOLUTION | RESPIRATORY_TRACT | Status: DC | PRN

## 2013-09-26 MED ORDER — IPRATROPIUM-ALBUTEROL 0.5-2.5 (3) MG/3ML IN SOLN
3.0000 mL | Freq: Once | RESPIRATORY_TRACT | Status: AC
  Administered 2013-09-26: 3 mL via RESPIRATORY_TRACT

## 2013-09-26 MED ORDER — HYDROCODONE-HOMATROPINE 5-1.5 MG/5ML PO SYRP
5.0000 mL | ORAL_SOLUTION | Freq: Once | ORAL | Status: AC
  Administered 2013-09-26: 5 mL via ORAL
  Filled 2013-09-26: qty 5

## 2013-09-26 MED ORDER — HYDROCODONE-HOMATROPINE 5-1.5 MG/5ML PO SYRP: 5.0000 mL | ORAL_SOLUTION | Freq: Four times a day (QID) | ORAL | Status: DC | PRN

## 2013-09-26 MED ORDER — PREDNISONE 20 MG PO TABS
60.0000 mg | ORAL_TABLET | Freq: Once | ORAL | Status: AC
  Administered 2013-09-26: 60 mg via ORAL
  Filled 2013-09-26: qty 3

## 2013-09-26 MED ORDER — IPRATROPIUM-ALBUTEROL 0.5-2.5 (3) MG/3ML IN SOLN
RESPIRATORY_TRACT | Status: AC
  Administered 2013-09-26: 07:00:00
  Filled 2013-09-26: qty 3

## 2013-09-26 MED ORDER — IPRATROPIUM-ALBUTEROL 0.5-2.5 (3) MG/3ML IN SOLN
3.0000 mL | Freq: Once | RESPIRATORY_TRACT | Status: AC
  Administered 2013-09-26: 3 mL via RESPIRATORY_TRACT
  Filled 2013-09-26: qty 3

## 2013-09-26 MED ORDER — ALBUTEROL SULFATE HFA 108 (90 BASE) MCG/ACT IN AERS: 2.0000 | INHALATION_SPRAY | RESPIRATORY_TRACT | Status: DC | PRN

## 2013-09-26 MED ORDER — PREDNISONE 20 MG PO TABS: 40.0000 mg | ORAL_TABLET | Freq: Every day | ORAL | Status: DC

## 2013-09-26 NOTE — ED Provider Notes (Signed)
CSN: [id number]     Arrival date & time 09/26/13  0522 History   First MD Initiated Contact with Patient 09/26/13 [phone]     Chief Complaint  Patient presents with  . Cough     (Consider location/radiation/quality/duration/timing/severity/associated sxs/prior Treatment) HPI Comments: Patient is a 43 yo F PMHx significant for tobacco use, recurrent bronchitis and PNA presenting to the ED for four days of productive cough with green sputum, nasal congestion, and mild to moderate post-tussive chest tightness and emesis. Patient has tried OTC cough and cold medications and inhaler with minimal improvement of symptoms. She states "everything makes my cough worse." Endorses 2-3 episodes of NB diarrhea on Wednesday that has since resolved. Denies any fevers or chills or abdominal pain.   Patient is a 43 y.o. female presenting with cough.  Cough Associated symptoms: rhinorrhea   Associated symptoms: no chills and no fever     Past Medical History  Diagnosis Date  . Bronchitis   . Pneumonia   . Back pain    Past Surgical History  Procedure Laterality Date  . Cholecystectomy    . Abdominal hysterectomy    . Ankle surgery    . Knee surgery     No family history on file. History  Substance Use Topics  . Smoking status: Current Every Day Smoker -- 1.50 packs/day for 24 years    Types: Cigarettes  . Smokeless tobacco: Not on file  . Alcohol Use: No   OB History   Grav Para Term Preterm Abortions TAB SAB Ect Mult Living                 Review of Systems  Constitutional: Negative for fever and chills.  HENT: Positive for congestion and rhinorrhea.   Respiratory: Positive for cough and chest tightness.   Gastrointestinal: Positive for vomiting (posttussive).  All other systems reviewed and are negative.     Allergies  Percocet and Vicodin  Home Medications   Prior to Admission medications   Medication Sig Start Date End Date Taking? Authorizing Provider  acetaminophen  (TYLENOL) 500 MG tablet Take 500 mg by mouth every 6 (six) hours as needed for pain.   Yes Historical Provider, MD  albuterol (PROVENTIL HFA;VENTOLIN HFA) 108 (90 BASE) MCG/ACT inhaler Inhale 1 puff into the lungs every 6 (six) hours as needed for wheezing or shortness of breath.   Yes Historical Provider, MD  azelastine (ASTELIN) 137 MCG/SPRAY nasal spray Place 1 spray into both nostrils 2 (two) times daily. Use in each nostril as directed   Yes Historical Provider, MD  sertraline (ZOLOFT) 100 MG tablet Take 100 mg by mouth daily.   Yes Historical Provider, MD  traMADol (ULTRAM) 50 MG tablet Take 50 mg by mouth every 6 (six) hours as needed for pain.    Historical Provider, MD   BP 147/67  Pulse 88  Temp(Src) 99 F (37.2 C) (Oral)  Resp 19  Ht 5\' 6"  (1.676 m)  Wt 264 lb (119.75 kg)  BMI 42.63 kg/m2  SpO2 93% Physical Exam  Nursing note and vitals reviewed. Constitutional: She is oriented to person, place, and time. She appears well-developed and well-nourished. No distress.  Talking in complete sentences w/o difficulty.   HENT:  Head: Normocephalic and atraumatic.  Right Ear: Hearing, tympanic membrane, external ear and ear canal normal.  Left Ear: Hearing, tympanic membrane, external ear and ear canal normal.  Nose: Rhinorrhea present.  Mouth/Throat: Uvula is midline, oropharynx is clear and moist and mucous  membranes are normal. No oropharyngeal exudate.  Eyes: Conjunctivae are normal.  Neck: Normal range of motion. Neck supple.  Cardiovascular: Normal rate, regular rhythm and normal heart sounds.   Pulmonary/Chest: Effort normal. No accessory muscle usage. No respiratory distress. She has no decreased breath sounds. She has wheezes (mild expiratory wheeze). She has no rhonchi. She has no rales. She exhibits tenderness.  Abdominal: Soft. Bowel sounds are normal. She exhibits no distension. There is no tenderness. There is no rebound and no guarding.  Musculoskeletal: Normal range of  motion. She exhibits no edema.  Lymphadenopathy:    She has no cervical adenopathy.  Neurological: She is alert and oriented to person, place, and time.  Skin: Skin is warm and dry. She is not diaphoretic. No cyanosis. Nails show no clubbing.  Psychiatric: She has a normal mood and affect.    ED Course  Procedures (including critical care time) Medications  HYDROcodone-homatropine (HYCODAN) 5-1.5 MG/5ML syrup 5 mL (5 mLs Oral Given 09/26/13 0625)  ipratropium-albuterol (DUONEB) 0.5-2.5 (3) MG/3ML nebulizer solution 3 mL (3 mLs Nebulization Given 09/26/13 0625)  predniSONE (DELTASONE) tablet 60 mg (60 mg Oral Given 09/26/13 0625)  ipratropium-albuterol (DUONEB) 0.5-2.5 (3) MG/3ML nebulizer solution 3 mL (3 mLs Nebulization Given 09/26/13 0647)  ipratropium-albuterol (DUONEB) 0.5-2.5 (3) MG/3ML nebulizer solution (  Given by Other 09/26/13 0647)    Labs Review Labs Reviewed - No data to display  Imaging Review Dg Chest 2 View  09/26/2013   CLINICAL DATA:  Shortness of breath, chest pain, cough and congestion. History of smoking.  EXAM: CHEST  2 VIEW  COMPARISON:  Chest radiograph performed 03/27/2013  FINDINGS: The lungs are well-aerated. Vascular congestion is noted, with increased interstitial markings, raising concern for mild pulmonary edema. There is no evidence of pleural effusion or pneumothorax.  The heart is normal in size; the mediastinal contour is within normal limits. Chronic peribronchial thickening is noted. No acute osseous abnormalities are seen. Clips are noted within the right upper quadrant, reflecting prior cholecystectomy.  IMPRESSION: Vascular congestion, with increased interstitial markings, raising concern for mild pulmonary edema. Mild interstitial lung disease could have a similar appearance.   Electronically Signed   By: Roanna RaiderJeffery  Chang M.D.   On: 09/26/2013 06:11     EKG Interpretation None      MDM   Final diagnoses:  Viral respiratory illness    Filed  Vitals:   09/26/13 0741  BP: 147/67  Pulse: 88  Temp:   Resp: 19   Afebrile, NAD, non-toxic appearing, AAOx4.   After two Duonebs, patient endorses improvement of her symptoms. Lungs are now CTA on re-evaluation. Patient appears comfortable.   Pt CXR negative for acute infiltrate, upon review of previous CXR no appreciable difference noted, likely chronic changes rather than edema. Patients symptoms are consistent with URI, likely viral etiology. Discussed that antibiotics are not indicated for viral infections. Pt will be discharged with symptomatic treatment.  Verbalizes understanding and is agreeable with plan. Pt is hemodynamically stable & in NAD prior to dc. Patient d/w with Dr. Norlene Campbelltter, agrees with plan.       Jeannetta EllisJennifer L Valeen Borys, PA-C 09/26/13 [phone]

## 2013-09-26 NOTE — ED Notes (Signed)
Pt states her chest hurts from coughing,  Pt says she feels just a little better,  Pt is in NAD

## 2013-09-26 NOTE — Discharge Instructions (Signed)
Please follow up with your primary care physician in 1-2 days. If you do not have one please call the [facility] number listed above. Please use your nebulizer and/or inhaler every four to six hours over the next few days to help with your symptoms. Please take Hycodan with caution as it contains a narcotic and may make you drowsy. Please read all discharge instructions and return precautions.   Upper Respiratory Infection, Adult An upper respiratory infection (URI) is also sometimes known as the common cold. The upper respiratory tract includes the nose, sinuses, throat, trachea, and bronchi. Bronchi are the airways leading to the lungs. Most people improve within 1 week, but symptoms can last up to 2 weeks. A residual cough may last even longer.  CAUSES Many different viruses can infect the tissues lining the upper respiratory tract. The tissues become irritated and inflamed and often become very moist. Mucus production is also common. A cold is contagious. You can easily spread the virus to others by oral contact. This includes kissing, sharing a glass, coughing, or sneezing. Touching your mouth or nose and then touching a surface, which is then touched by another person, can also spread the virus. SYMPTOMS  Symptoms typically develop 1 to 3 days after you come in contact with a cold virus. Symptoms vary from person to person. They may include:  Runny nose.  Sneezing.  Nasal congestion.  Sinus irritation.  Sore throat.  Loss of voice (laryngitis).  Cough.  Fatigue.  Muscle aches.  Loss of appetite.  Headache.  Low-grade fever. DIAGNOSIS  You might diagnose your own cold based on familiar symptoms, since most people get a cold 2 to 3 times a year. Your caregiver can confirm this based on your exam. Most importantly, your caregiver can check that your symptoms are not due to another disease such as strep throat, sinusitis, pneumonia, asthma, or epiglottitis.  Blood tests, throat tests, and X-rays are not necessary to diagnose a common cold, but they may sometimes be helpful in excluding other more serious diseases. Your caregiver will decide if any further tests are required. RISKS AND COMPLICATIONS  You may be at risk for a more severe case of the common cold if you smoke cigarettes, have chronic heart disease (such as heart failure) or lung disease (such as asthma), or if you have a weakened immune system. The very young and very old are also at risk for more serious infections. Bacterial sinusitis, middle ear infections, and bacterial pneumonia can complicate the common cold. The common cold can worsen asthma and chronic obstructive pulmonary disease (COPD). Sometimes, these complications can require emergency medical care and may be life-threatening. PREVENTION  The best way to protect against getting a cold is to practice good hygiene. Avoid oral or hand contact with people with cold symptoms. Wash your hands often if contact occurs. There is no clear evidence that vitamin C, vitamin E, echinacea, or exercise reduces the chance of developing a cold. However, it is always recommended to get plenty of rest and practice good nutrition. TREATMENT  Treatment is directed at relieving symptoms. There is no cure. Antibiotics are not effective, because the infection is caused by a virus, not by bacteria. Treatment may include:  Increased fluid intake. Sports drinks offer valuable electrolytes, sugars, and fluids.  Breathing heated mist or steam (vaporizer or shower).  Eating chicken soup or other clear broths, and maintaining good nutrition.  Getting plenty of rest.  Using gargles or lozenges for  comfort.  Controlling fevers with ibuprofen or acetaminophen as directed by your caregiver.  Increasing usage of your inhaler if you have asthma. Zinc gel and zinc lozenges, taken in the first 24 hours of the common cold, can shorten the duration and lessen the  severity of symptoms. Pain medicines may help with fever, muscle aches, and throat pain. A variety of non-prescription medicines are available to treat congestion and runny nose. Your caregiver can make recommendations and may suggest nasal or lung inhalers for other symptoms.  HOME CARE INSTRUCTIONS   Only take over-the-counter or prescription medicines for pain, discomfort, or fever as directed by your caregiver.  Use a warm mist humidifier or inhale steam from a shower to increase air moisture. This may keep secretions moist and make it easier to breathe.  Drink enough water and fluids to keep your urine clear or pale yellow.  Rest as needed.  Return to work when your temperature has returned to normal or as your caregiver advises. You may need to stay home longer to avoid infecting others. You can also use a face mask and careful hand washing to prevent spread of the virus. SEEK MEDICAL CARE IF:   After the first few days, you feel you are getting worse rather than better.  You need your caregiver's advice about medicines to control symptoms.  You develop chills, worsening shortness of breath, or brown or red sputum. These may be signs of pneumonia.  You develop yellow or brown nasal discharge or pain in the face, especially when you bend forward. These may be signs of sinusitis.  You develop a fever, swollen neck glands, pain with swallowing, or white areas in the back of your throat. These may be signs of strep throat. SEEK IMMEDIATE MEDICAL CARE IF:   You have a fever.  You develop severe or persistent headache, ear pain, sinus pain, or chest pain.  You develop wheezing, a prolonged cough, cough up blood, or have a change in your usual mucus (if you have chronic lung disease).  You develop sore muscles or a stiff neck. Document Released: 11/13/2000 Document Revised: 08/12/2011 Document Reviewed: 09/21/2010 [facility] Patient Information 2014 [place], [place].

## 2013-09-26 NOTE — ED Provider Notes (Signed)
Medical screening examination/treatment/procedure(s) were performed by non-physician practitioner and as supervising physician I was immediately available for consultation/collaboration.   EKG Interpretation None       Kelsey Morgan M Kelsey Corpening, MD 09/26/13 2141

## 2013-09-26 NOTE — ED Notes (Signed)
Patient c/o cough, onset Wednesday, believes she may have bronchitis. States she had bronchitis and pneumonia during October - January.

## 2013-10-04 ENCOUNTER — Encounter (HOSPITAL_COMMUNITY): Payer: Self-pay | Admitting: Emergency Medicine

## 2013-10-04 ENCOUNTER — Emergency Department (INDEPENDENT_AMBULATORY_CARE_PROVIDER_SITE_OTHER)
Admission: EM | Admit: 2013-10-04 | Discharge: 2013-10-04 | Disposition: A | Discharge status: Home / Self Care | Payer: Self-pay | Source: Home / Self Care

## 2013-10-04 DIAGNOSIS — F17200 Nicotine dependence, unspecified, uncomplicated: Secondary | ICD-10-CM

## 2013-10-04 DIAGNOSIS — J9801 Acute bronchospasm: Secondary | ICD-10-CM

## 2013-10-04 DIAGNOSIS — Z720 Tobacco use: Secondary | ICD-10-CM

## 2013-10-04 DIAGNOSIS — J40 Bronchitis, not specified as acute or chronic: Secondary | ICD-10-CM

## 2013-10-04 MED ORDER — TRIAMCINOLONE ACETONIDE 40 MG/ML IJ SUSP
INTRAMUSCULAR | Status: AC
  Filled 2013-10-04: qty 2

## 2013-10-04 MED ORDER — ALBUTEROL SULFATE (2.5 MG/3ML) 0.083% IN NEBU
INHALATION_SOLUTION | RESPIRATORY_TRACT | Status: AC
  Filled 2013-10-04: qty 3

## 2013-10-04 MED ORDER — ALBUTEROL SULFATE (2.5 MG/3ML) 0.083% IN NEBU
2.5000 mg | INHALATION_SOLUTION | Freq: Once | RESPIRATORY_TRACT | Status: AC
  Administered 2013-10-04: 2.5 mg via RESPIRATORY_TRACT

## 2013-10-04 MED ORDER — METHYLPREDNISOLONE 4 MG PO KIT: PACK | ORAL | Status: DC

## 2013-10-04 MED ORDER — IPRATROPIUM BROMIDE 0.02 % IN SOLN
RESPIRATORY_TRACT | Status: AC
  Filled 2013-10-04: qty 2.5

## 2013-10-04 MED ORDER — IPRATROPIUM-ALBUTEROL 0.5-2.5 (3) MG/3ML IN SOLN
3.0000 mL | Freq: Once | RESPIRATORY_TRACT | Status: AC
  Administered 2013-10-04: 3 mL via RESPIRATORY_TRACT

## 2013-10-04 MED ORDER — TRIAMCINOLONE ACETONIDE 40 MG/ML IJ SUSP
60.0000 mg | Freq: Once | INTRAMUSCULAR | Status: AC
  Administered 2013-10-04: 60 mg via INTRAMUSCULAR

## 2013-10-04 NOTE — ED Provider Notes (Signed)
CSN: [id number]     Arrival date & time 10/04/13  1043 History   First MD Initiated Contact with Patient 10/04/13 1238     Chief Complaint  Patient presents with  . URI   (Consider location/radiation/quality/duration/timing/severity/associated sxs/prior Treatment) HPI Comments: Morbidly obese 43 year old female with a history of tobacco addiction and abuse associated with frequent episodes of bronchitis presents with cough, wheezing and upper respiratory congestion. She was seen in the emergency department approximately 8 days ago for the same symptoms. She was administered a series of Duonebs  and prednisone. She improved from those treatments and was discharged home with Hycodan cough medicine, prednisone and instructions to use her albuterol inhaler.  Patient is a 43 y.o. female presenting with URI.  URI Presenting symptoms: congestion, cough and rhinorrhea   Presenting symptoms: no fever and no sore throat   Associated symptoms: wheezing     Past Medical History  Diagnosis Date  . Bronchitis   . Pneumonia   . Back pain    Past Surgical History  Procedure Laterality Date  . Cholecystectomy    . Abdominal hysterectomy    . Ankle surgery    . Knee surgery     No family history on file. History  Substance Use Topics  . Smoking status: Current Every Day Smoker -- 1.50 packs/day for 24 years    Types: Cigarettes  . Smokeless tobacco: Not on file  . Alcohol Use: No   OB History   Grav Para Term Preterm Abortions TAB SAB Ect Mult Living                 Review of Systems  Constitutional: Positive for activity change. Negative for fever.  HENT: Positive for congestion and rhinorrhea. Negative for sore throat.   Respiratory: Positive for cough, shortness of breath and wheezing.   Cardiovascular: Negative for chest pain and palpitations.  Gastrointestinal: Negative.   Genitourinary: Negative.   Skin: Negative.   Neurological: Negative.     Allergies  Percocet and  Vicodin  Home Medications   Prior to Admission medications   Medication Sig Start Date End Date Taking? Authorizing Provider  acetaminophen (TYLENOL) 500 MG tablet Take 500 mg by mouth every 6 (six) hours as needed for pain.    Historical Provider, MD  albuterol (PROVENTIL HFA;VENTOLIN HFA) 108 (90 BASE) MCG/ACT inhaler Inhale 2 puffs into the lungs every 4 (four) hours as needed for wheezing or shortness of breath. 09/26/13   Jennifer L Piepenbrink, PA-C  albuterol (PROVENTIL) (2.5 MG/3ML) 0.083% nebulizer solution Take 3 mLs (2.5 mg total) by nebulization every 4 (four) hours as needed for wheezing or shortness of breath. 09/26/13   Jennifer L Piepenbrink, PA-C  azelastine (ASTELIN) 137 MCG/SPRAY nasal spray Place 1 spray into both nostrils 2 (two) times daily. Use in each nostril as directed    Historical Provider, MD  HYDROcodone-homatropine (HYCODAN) 5-1.5 MG/5ML syrup Take 5 mLs by mouth every 6 (six) hours as needed for cough. 09/26/13   Jennifer L Piepenbrink, PA-C  predniSONE (DELTASONE) 20 MG tablet Take 2 tablets (40 mg total) by mouth daily. 09/26/13   Jennifer L Piepenbrink, PA-C  sertraline (ZOLOFT) 100 MG tablet Take 100 mg by mouth daily.    Historical Provider, MD  traMADol (ULTRAM) 50 MG tablet Take 50 mg by mouth every 6 (six) hours as needed for pain.    Historical Provider, MD   BP 125/83  Pulse 78  Temp(Src) 98.8 F (37.1 C) (Oral)  Resp 24  SpO2 97% Physical Exam  Nursing note and vitals reviewed. Constitutional: She is oriented to person, place, and time.  OBese pt, alert and coughing. Able to verbalize her hx but with much coughing.   HENT:  Mouth/Throat: No oropharyngeal exudate.  OP with clear PND  Eyes: Conjunctivae and EOM are normal.  Neck: Normal range of motion. Neck supple.  Cardiovascular: Normal rate, regular rhythm and normal heart sounds.   Pulmonary/Chest: She has wheezes.  Decreased air movement  Lymphadenopathy:    She has no cervical adenopathy.   Neurological: She is alert and oriented to person, place, and time. She exhibits normal muscle tone.  Skin: Skin is warm and dry.  Psychiatric: She has a normal mood and affect.    ED Course  Procedures (including critical care time) Labs Review Labs Reviewed - No data to display  Imaging Review No results found.   MDM   1. Bronchitis   2. Tobacco abuse   3. Bronchospasm     S/P one duoneb pt is breathing and feeling better, wants to go home. Auscultation reveals decreased wheezes. There are a few crackles in the bases. This was present in the ED last week.  Cont using your inhalers and nebs . Need to f/u with your PCP. For worsening go to to the ED, esp with fever and poor response to nebs    Hayden Rasmussenavid Justine Cossin, NP 10/04/13 1349

## 2013-10-04 NOTE — Discharge Instructions (Signed)
Bronchitis Bronchitis is inflammation of the airways that extend from the windpipe into the lungs (bronchi). The inflammation often causes mucus to develop, which leads to a cough. If the inflammation becomes severe, it may cause shortness of breath. CAUSES  Bronchitis may be caused by:   Viral infections.   Bacteria.   Cigarette smoke.   Allergens, pollutants, and other irritants.  SIGNS AND SYMPTOMS  The most common symptom of bronchitis is a frequent cough that produces mucus. Other symptoms include:  Fever.   Body aches.   Chest congestion.   Chills.   Shortness of breath.   Sore throat.  DIAGNOSIS  Bronchitis is usually diagnosed through a medical history and physical exam. Tests, such as chest X-rays, are sometimes done to rule out other conditions.  TREATMENT  You may need to avoid contact with whatever caused the problem (smoking, for example). Medicines are sometimes needed. These may include:  Antibiotics. These may be prescribed if the condition is caused by bacteria.  Cough suppressants. These may be prescribed for relief of cough symptoms.   Inhaled medicines. These may be prescribed to help open your airways and make it easier for you to breathe.   Steroid medicines. These may be prescribed for those with recurrent (chronic) bronchitis. HOME CARE INSTRUCTIONS  Get plenty of rest.   Drink enough fluids to keep your urine clear or pale yellow (unless you have a medical condition that requires fluid restriction). Increasing fluids may help thin your secretions and will prevent dehydration.   Only take over-the-counter or prescription medicines as directed by your health care provider.  Only take antibiotics as directed. Make sure you finish them even if you start to feel better.  Avoid secondhand smoke, irritating chemicals, and strong fumes. These will make bronchitis worse. If you are a smoker, quit smoking. Consider using nicotine gum or  skin patches to help control withdrawal symptoms. Quitting smoking will help your lungs heal faster.   Put a cool-mist humidifier in your bedroom at night to moisten the air. This may help loosen mucus. Change the water in the humidifier daily. You can also run the hot water in your shower and sit in the bathroom with the door closed for 5 10 minutes.   Follow up with your health care provider as directed.   Wash your hands frequently to avoid catching bronchitis again or spreading an infection to others.  SEEK MEDICAL CARE IF: Your symptoms do not improve after 1 week of treatment.  SEEK IMMEDIATE MEDICAL CARE IF:  Your fever increases.  You have chills.   You have chest pain.   You have worsening shortness of breath.   You have bloody sputum.  You faint.  You have lightheadedness.  You have a severe headache.   You vomit repeatedly. MAKE SURE YOU:   Understand these instructions.  Will watch your condition.  Will get help right away if you are not doing well or get worse. Document Released: 05/20/2005 Document Revised: 03/10/2013 Document Reviewed: 01/12/2013 [facility] Patient Information 2014 [place], [place].  Asthma, Acute Bronchospasm Acute bronchospasm caused by asthma is also referred to as an asthma attack. Bronchospasm means your air passages become narrowed. The narrowing is caused by inflammation and tightening of the muscles in the air tubes (bronchi) in your lungs. This can make it hard to breath or cause you to wheeze and cough. CAUSES Possible triggers are:  Animal dander from the skin, hair, or feathers of animals.  Dust mites contained in  house dust.  Cockroaches.  Pollen from trees or grass.  Mold.  Cigarette or tobacco smoke.  Air pollutants such as dust, household cleaners, hair sprays, aerosol sprays, paint fumes, strong chemicals, or strong odors.  Cold air or weather changes. Cold air may trigger inflammation. Winds increase  molds and pollens in the air.  Strong emotions such as crying or laughing hard.  Stress.  Certain medicines such as aspirin or beta-blockers.  Sulfites in foods and drinks, such as dried fruits and wine.  Infections or inflammatory conditions, such as a flu, cold, or inflammation of the nasal membranes (rhinitis).  Gastroesophageal reflux disease (GERD). GERD is a condition where stomach acid backs up into your throat (esophagus).  Exercise or strenuous activity. SIGNS AND SYMPTOMS   Wheezing.  Excessive coughing, particularly at night.  Chest tightness.  Shortness of breath. DIAGNOSIS  Your health care provider will ask you about your medical history and perform a physical exam. A chest X-ray or blood testing may be performed to look for other causes of your symptoms or other conditions that may have triggered your asthma attack. TREATMENT  Treatment is aimed at reducing inflammation and opening up the airways in your lungs. Most asthma attacks are treated with inhaled medicines. These include quick relief or rescue medicines (such as bronchodilators) and controller medicines (such as inhaled corticosteroids). These medicines are sometimes given through an inhaler or a nebulizer. Systemic steroid medicine taken by mouth or given through an IV tube also can be used to reduce the inflammation when an attack is moderate or severe. Antibiotic medicines are only used if a bacterial infection is present.  HOME CARE INSTRUCTIONS   Rest.  Drink plenty of liquids. This helps the mucus to remain thin and be easily coughed up. Only use caffeine in moderation and do not use alcohol until you have recovered from your illness.  Do not smoke. Avoid being exposed to secondhand smoke.  You play a critical role in keeping yourself in good health. Avoid exposure to things that cause you to wheeze or to have breathing problems.  Keep your medicines up to date and available. Carefully follow your  health care provider's treatment plan.  Take your medicine exactly as prescribed.  When pollen or pollution is bad, keep windows closed and use an air conditioner or go to places with air conditioning.  Asthma requires careful medical care. See your health care provider for a follow-up as advised. If you are more than [redacted] weeks pregnant and you were prescribed any new medicines, let your obstetrician know about the visit and how you are doing. Follow-up with your health care provider as directed.  After you have recovered from your asthma attack, make an appointment with your outpatient doctor to talk about ways to reduce the likelihood of future attacks. If you do not have a doctor who manages your asthma, make an appointment with a primary care doctor to discuss your asthma. SEEK IMMEDIATE MEDICAL CARE IF:   You are getting worse.  You have trouble breathing. If severe, call your local emergency services (911 in the [place]  You develop chest pain or discomfort.  You are vomiting.  You are not able to keep fluids down.  You are coughing up yellow, green, brown, or bloody sputum.  You have a fever and your symptoms suddenly get worse.  You have trouble swallowing. MAKE SURE YOU:   Understand these instructions.  Will watch your condition.  Will get help right away if  you are not doing well or get worse. Document Released: 09/04/2006 Document Revised: 01/20/2013 Document Reviewed: 11/25/2012 [facility] Patient Information 2014 [place]ExitCare, [place].  Chronic Obstructive Pulmonary Disease Chronic obstructive pulmonary disease (COPD) is a common lung problem. In COPD, the flow of air from the lungs is limited. The way your lungs work will probably never return to normal, but there are things you can do to improve you lungs and make yourself feel better. HOME CARE  Take all medicines as told by your doctor.  Only take over-the-counter or prescription medicines as told by your  doctor.  Avoid medicines or cough syrups that dry up your airway (such as antihistamines) and do not allow you to get rid of thick spit. You do not need to avoid them if told differently by your doctor.  If you smoke, stop. Smoking makes the problem worse.  Avoid being around things that make your breathing worse (like smoke, chemicals, and fumes).  Use oxygen therapy and therapy to help improve your lungs (pulmonary rehabilitation) if told by your doctor. If you need home oxygen therapy, ask your doctor if you should buy a tool to measure your oxygen level (oximeter).  Avoid people who have a sickness you can catch (contagious).  Avoid going outside when it is very hot, cold, or humid.  Eat healthy foods. Eat smaller meals more often. Rest before meals.  Stay active, but remember to also rest.  Make sure to get all the shots (vaccines) your doctor recommends. Ask your doctor if you need a pneumonia shot.  Learn and use tips on how to relax.  Learn and use tips on how to control your breathing as told by your doctor. Try:  Breathing in (inhaling) through your nose for 1 second. Then, pucker your lips and breath out (exhale) through your lips for 2 seconds.  Putting one hand on your belly (abdomen). Breathe in slowly through your nose for 1 second. Your hand on your belly should move out. Pucker your lips and breathe out slowly through your lips. Your hand on your belly should move in as you breathe out.  Learn and use controlled coughing to clear thick spit from your lungs. 1. Lean your head a little forward. 2. Breathe in deeply. 3. Try to hold your breath for 3 seconds. 4. Keep your mouth slightly open while coughing 2 times. 5. Spit any thick spit out into a tissue. 6. Rest and do the steps again 1 or 2 times as needed. GET HELP IF:  You cough up more thick spit than usual.  There is a change in the color or thickness of the spit.  It is harder to breathe than  usual.  Your breathing is faster than usual. GET HELP RIGHT AWAY IF:   You have shortness of breath while resting.  You have shortness of breath that stops you from:  Being able to talk.  Doing normal activities.  You chest hurts for longer than 5 minutes.  Your skin color is more blue than usual.  Your pulse oximeter shows that you have low oxygen for longer than 5 minutes. MAKE SURE YOU:   Understand these instructions.  Will watch your condition.  Will get help right away if you are not doing well or get worse. Document Released: 11/06/2007 Document Revised: 03/10/2013 Document Reviewed: 01/14/2013 [facility] Patient Information 2014 [place] TractExitCare, [place]LLC.

## 2013-10-04 NOTE — ED Provider Notes (Signed)
Medical screening examination/treatment/procedure(s) were performed by non-physician practitioner and as supervising physician I was immediately available for consultation/collaboration.  Adja Ruff, M.D.  Aundre Hietala C Shanisha Lech, MD 10/04/13 1942 

## 2013-10-04 NOTE — ED Notes (Signed)
Seen 4-26 [facility], for same cough. Rx for MDI, reportedly no better, still smoking

## 2014-12-03 ENCOUNTER — Emergency Department (HOSPITAL_COMMUNITY): Payer: 59

## 2014-12-03 ENCOUNTER — Emergency Department (HOSPITAL_COMMUNITY)
Admission: EM | Admit: 2014-12-03 | Discharge: 2014-12-03 | Disposition: A | Discharge status: Home / Self Care | Payer: 59 | Attending: Emergency Medicine | Admitting: Emergency Medicine

## 2014-12-03 ENCOUNTER — Encounter (HOSPITAL_COMMUNITY): Payer: Self-pay | Admitting: *Deleted

## 2014-12-03 DIAGNOSIS — Z720 Tobacco use: Secondary | ICD-10-CM | POA: Insufficient documentation

## 2014-12-03 DIAGNOSIS — J159 Unspecified bacterial pneumonia: Secondary | ICD-10-CM | POA: Insufficient documentation

## 2014-12-03 DIAGNOSIS — Z3202 Encounter for pregnancy test, result negative: Secondary | ICD-10-CM | POA: Insufficient documentation

## 2014-12-03 DIAGNOSIS — Z791 Long term (current) use of non-steroidal anti-inflammatories (NSAID): Secondary | ICD-10-CM | POA: Diagnosis not present

## 2014-12-03 DIAGNOSIS — Z79899 Other long term (current) drug therapy: Secondary | ICD-10-CM | POA: Insufficient documentation

## 2014-12-03 DIAGNOSIS — J189 Pneumonia, unspecified organism: Secondary | ICD-10-CM

## 2014-12-03 DIAGNOSIS — R079 Chest pain, unspecified: Secondary | ICD-10-CM | POA: Diagnosis present

## 2014-12-03 LAB — TROPONIN I: Troponin I: <0.03 ng/mL (ref <0.031)

## 2014-12-03 LAB — URINALYSIS, ROUTINE W REFLEX MICROSCOPIC
BILIRUBIN URINE: NEGATIVE
Glucose, UA: NEGATIVE mg/dL
Hgb urine dipstick: NEGATIVE
Ketones, ur: NEGATIVE mg/dL
Leukocytes, UA: NEGATIVE
Nitrite: NEGATIVE
Protein, ur: NEGATIVE mg/dL
SPECIFIC GRAVITY, URINE: 1.019 (ref 1.005–1.030)
Urobilinogen, UA: 0.2 mg/dL (ref 0.0–1.0)
pH: 6 (ref 5.0–8.0)

## 2014-12-03 LAB — CBC WITH DIFFERENTIAL/PLATELET
BASOS PCT: 0 % (ref 0–1)
Basophils Absolute: 0 10*3/uL (ref 0.0–0.1)
EOS PCT: 2 % (ref 0–5)
Eosinophils Absolute: 0.3 10*3/uL (ref 0.0–0.7)
HCT: 40.7 % (ref 36.0–46.0)
HEMOGLOBIN: 13.1 g/dL (ref 12.0–15.0)
LYMPHS ABS: 3.7 10*3/uL (ref 0.7–4.0)
Lymphocytes Relative: 31 % (ref 12–46)
MCH: 27.2 pg (ref 26.0–34.0)
MCHC: 32.2 g/dL (ref 30.0–36.0)
MCV: 84.6 fL (ref 78.0–100.0)
MONO ABS: 0.8 10*3/uL (ref 0.1–1.0)
Monocytes Relative: 7 % (ref 3–12)
NEUTROS PCT: 60 % (ref 43–77)
Neutro Abs: 7.3 10*3/uL (ref 1.7–7.7)
Platelets: 349 10*3/uL (ref 150–400)
RBC: 4.81 MIL/uL (ref 3.87–5.11)
RDW: 14.5 % (ref 11.5–15.5)
WBC: 12.2 10*3/uL — AB (ref 4.0–10.5)

## 2014-12-03 LAB — COMPREHENSIVE METABOLIC PANEL
ALT: 13 U/L — ABNORMAL LOW (ref 14–54)
ANION GAP: 9 (ref 5–15)
AST: 19 U/L (ref 15–41)
Albumin: 3.3 g/dL — ABNORMAL LOW (ref 3.5–5.0)
Alkaline Phosphatase: 65 U/L (ref 38–126)
BILIRUBIN TOTAL: 0.2 mg/dL — AB (ref 0.3–1.2)
BUN: 8 mg/dL (ref 6–20)
CALCIUM: 8.7 mg/dL — AB (ref 8.9–10.3)
CHLORIDE: 109 mmol/L (ref 101–111)
CO2: 22 mmol/L (ref 22–32)
Creatinine, Ser: 0.97 mg/dL (ref 0.44–1.00)
Glucose, Bld: 110 mg/dL — ABNORMAL HIGH (ref 65–99)
Potassium: 3.5 mmol/L (ref 3.5–5.1)
Sodium: 140 mmol/L (ref 135–145)
Total Protein: 6.9 g/dL (ref 6.5–8.1)

## 2014-12-03 LAB — PROTIME-INR
INR: 0.98 (ref 0.00–1.49)
Prothrombin Time: 13.2 seconds (ref 11.6–15.2)

## 2014-12-03 LAB — BRAIN NATRIURETIC PEPTIDE: B NATRIURETIC PEPTIDE 5: 59.9 pg/mL (ref 0.0–100.0)

## 2014-12-03 LAB — LIPASE, BLOOD: Lipase: 21 U/L — ABNORMAL LOW (ref 22–51)

## 2014-12-03 LAB — PREGNANCY, URINE: Preg Test, Ur: NEGATIVE

## 2014-12-03 MED ORDER — AZITHROMYCIN 250 MG PO TABS
500.0000 mg | ORAL_TABLET | Freq: Once | ORAL | Status: AC
  Administered 2014-12-03: 500 mg via ORAL
  Filled 2014-12-03: qty 2

## 2014-12-03 MED ORDER — HYDROCODONE-HOMATROPINE 5-1.5 MG/5ML PO SYRP: 5.0000 mL | ORAL_SOLUTION | ORAL | Status: AC | PRN

## 2014-12-03 MED ORDER — ONDANSETRON HCL 4 MG/2ML IJ SOLN
4.0000 mg | Freq: Once | INTRAMUSCULAR | Status: AC
  Administered 2014-12-03: 4 mg via INTRAVENOUS
  Filled 2014-12-03: qty 2

## 2014-12-03 MED ORDER — ALBUTEROL SULFATE HFA 108 (90 BASE) MCG/ACT IN AERS: 1.0000 | INHALATION_SPRAY | Freq: Four times a day (QID) | RESPIRATORY_TRACT | Status: DC | PRN

## 2014-12-03 MED ORDER — IOHEXOL 350 MG/ML SOLN
100.0000 mL | Freq: Once | INTRAVENOUS | Status: AC | PRN
  Administered 2014-12-03: 100 mL via INTRAVENOUS

## 2014-12-03 MED ORDER — AZITHROMYCIN 250 MG PO TABS: 250.0000 mg | ORAL_TABLET | Freq: Every day | ORAL | Status: AC

## 2014-12-03 MED ORDER — IPRATROPIUM-ALBUTEROL 0.5-2.5 (3) MG/3ML IN SOLN
3.0000 mL | Freq: Once | RESPIRATORY_TRACT | Status: AC
  Administered 2014-12-03: 3 mL via RESPIRATORY_TRACT
  Filled 2014-12-03: qty 3

## 2014-12-03 MED ORDER — AEROCHAMBER PLUS W/MASK MISC: Status: DC

## 2014-12-03 MED ORDER — IBUPROFEN 800 MG PO TABS: 800.0000 mg | ORAL_TABLET | Freq: Three times a day (TID) | ORAL | Status: DC

## 2014-12-03 MED ORDER — DEXTROSE 5 % IV SOLN
1.0000 g | Freq: Once | INTRAVENOUS | Status: AC
  Administered 2014-12-03: 1 g via INTRAVENOUS
  Filled 2014-12-03: qty 10

## 2014-12-03 MED ORDER — MORPHINE SULFATE 4 MG/ML IJ SOLN
4.0000 mg | Freq: Once | INTRAMUSCULAR | Status: AC
  Administered 2014-12-03: 4 mg via INTRAVENOUS
  Filled 2014-12-03: qty 1

## 2014-12-03 MED ORDER — ASPIRIN 325 MG PO TABS
325.0000 mg | ORAL_TABLET | Freq: Once | ORAL | Status: AC
  Administered 2014-12-03: 325 mg via ORAL
  Filled 2014-12-03: qty 1

## 2014-12-03 NOTE — Discharge Instructions (Signed)

## 2014-12-03 NOTE — ED Notes (Addendum)
On Monday started having pain under breast, like a burning sensation. When she would lay down she felt like her heart was "fluttering". Today symptoms have worsened and she started having a congested, non-productive cough. Also this week, lle has been hurting. Pt with recent history of smoking. 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

## 2014-12-03 NOTE — ED Provider Notes (Signed)
CSN: [id number]     Arrival date & time 12/03/14  1806 History   First MD Initiated Contact with Patient 12/03/14 1829     Chief Complaint  Patient presents with  . Chest Pain     (Consider location/radiation/quality/duration/timing/severity/associated sxs/prior Treatment) HPI The patient reports that she's been having some chest pain that developed under her left breast. It's been there for 6 days. She states is worse when she takes a deep breath. She reports that she also felt somewhat short of breath. Today it got worse and she felt increasingly short of breath. She denies fevers but today developed a cough. Pain is worse with coughing. She also notes that her left leg it seems sore and may be slightly swollen. 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 She has not had similar pain. She's not had a history of blood clots or heart disease. There have not been other associated symptoms of headache chills myalgia or rash. Past Medical History  Diagnosis Date  . Bronchitis   . Pneumonia   . Back pain    Past Surgical History  Procedure Laterality Date  . Cholecystectomy    . Abdominal hysterectomy    . Ankle surgery    . Knee surgery     History reviewed. No pertinent family history. History  Substance Use Topics  . Smoking status: Current Every Day Smoker -- 0.50 packs/day for 25 years    Types: Cigarettes  . Smokeless tobacco: Not on file  . Alcohol Use: No   OB History    No data available     Review of Systems  10 Systems reviewed and are negative for acute change except as noted in the HPI.   Allergies  Percocet and Vicodin  Home Medications   Prior to Admission medications   Medication Sig Start Date End Date Taking? Authorizing Provider  acetaminophen (TYLENOL) 500 MG tablet Take 500 mg by mouth every 6 (six) hours as needed for pain.   Yes Historical Provider, MD  albuterol (PROVENTIL HFA;VENTOLIN HFA) 108 (90 BASE) MCG/ACT inhaler Inhale 1-2  puffs into the lungs every 6 (six) hours as needed for wheezing or shortness of breath. 12/03/14   Arby Barrette, MD  azelastine (ASTELIN) 137 MCG/SPRAY nasal spray Place 1 spray into both nostrils 2 (two) times daily as needed for allergies. Use in each nostril as directed    Historical Provider, MD  azithromycin (ZITHROMAX Z-PAK) 250 MG tablet Take 1 tablet (250 mg total) by mouth daily. 12/04/14 12/06/14  Arby Barrette, MD  HYDROcodone-homatropine (HYCODAN) 5-1.5 MG/5ML syrup Take 5 mLs by mouth every 4 (four) hours as needed for cough. 12/03/14 12/06/14  Arby Barrette, MD  ibuprofen (ADVIL,MOTRIN) 800 MG tablet Take 1 tablet (800 mg total) by mouth 3 (three) times daily. 12/03/14   Arby Barrette, MD  Spacer/Aero-Holding Chambers (AEROCHAMBER PLUS WITH MASK) inhaler Use as instructed 12/03/14   Arby Barrette, MD   BP 118/59 mmHg  Pulse 74  Temp(Src) 98.5 F (36.9 C) (Oral)  Resp 26  SpO2 96% Physical Exam  Constitutional: She is oriented to person, place, and time. She appears well-developed and well-nourished.  HENT:  Head: Normocephalic and atraumatic.  Eyes: EOM are normal. Pupils are equal, round, and reactive to light.  Neck: Neck supple.  Cardiovascular: Normal rate, regular rhythm, normal heart sounds and intact distal pulses.   Pulmonary/Chest: Effort normal. No respiratory distress. She has rales. She exhibits tenderness.  Occasional dry cough paroxysmal. Occasional Rales  at the bases. Left chest wall is very tender to palpation but no crepitus or rashes present.  Abdominal: Soft. Bowel sounds are normal. She exhibits no distension. There is no tenderness.  Musculoskeletal: Normal range of motion. She exhibits no edema.  Patient reports swelling and discomfort in the left leg but to inspection there does not appear to be asymmetry. The leg is warm to the muscle bodies are soft and nontender. The distal pulses are 2+ and symmetric.  Neurological: She is alert and oriented to person, place,  and time. She has normal strength. Coordination normal. GCS eye subscore is 4. GCS verbal subscore is 5. GCS motor subscore is 6.  Skin: Skin is warm, dry and intact.  Psychiatric: She has a normal mood and affect.    ED Course  Procedures (including critical care time) Labs Review Labs Reviewed  COMPREHENSIVE METABOLIC PANEL - Abnormal; Notable for the following:    Glucose, Bld 110 (*)    Calcium 8.7 (*)    Albumin 3.3 (*)    ALT 13 (*)    Total Bilirubin 0.2 (*)    All other components within normal limits  LIPASE, BLOOD - Abnormal; Notable for the following:    Lipase 21 (*)    All other components within normal limits  CBC WITH DIFFERENTIAL/PLATELET - Abnormal; Notable for the following:    WBC 12.2 (*)    All other components within normal limits  BRAIN NATRIURETIC PEPTIDE  TROPONIN I  PROTIME-INR  PREGNANCY, URINE  URINALYSIS, ROUTINE W REFLEX MICROSCOPIC (NOT [facility])    Imaging Review Ct Angio Chest Pe W/cm &/or Wo Cm  12/03/2014   CLINICAL DATA:  Pain under the left breast, with congested cough. Left lower extremity pain. Initial encounter.  EXAM: CT ANGIOGRAPHY CHEST WITH CONTRAST  TECHNIQUE: Multidetector CT imaging of the chest was performed using the standard protocol during bolus administration of intravenous contrast. Multiplanar CT image reconstructions and MIPs were obtained to evaluate the vascular anatomy.  CONTRAST:  100mL OMNIPAQUE IOHEXOL 350 MG/ML SOLN  COMPARISON:  Chest radiograph from 09/26/2013, and CTA of the chest performed 03/13/2013  FINDINGS: There is no evidence of pulmonary embolus.  Hazy bilateral airspace opacities and interstitial prominence likely reflect mild interstitial edema. No significant pleural effusion or pneumothorax is seen. A stable 8 mm nodule is noted at the left lung base, unchanged from 2014. No masses are identified; no abnormal focal contrast enhancement is seen.  Scattered bilateral mediastinal and hilar nodes measure up to 1.7  cm in short axis, relatively similar in appearance to the prior CTA. These are of uncertain significance. No pericardial effusion is identified. The great vessels are grossly unremarkable in appearance. Incidental note is made of a direct origin of the left vertebral artery from the aortic arch. No axillary lymphadenopathy is seen. The visualized portions of the thyroid gland are unremarkable in appearance.  The visualized portions of the liver and spleen are unremarkable.  No acute osseous abnormalities are seen.  Review of the MIP images confirms the above findings.  IMPRESSION: 1. No evidence of pulmonary embolus. 2. Hazy bilateral airspace opacities and interstitial prominence likely reflect mild interstitial edema. 3. Stable appearance to 8 mm nodule at the left lung base, unchanged from 2014 and likely benign. 4. Relatively stable appearance to bilateral mediastinal and hilar nodes, measuring up to 1.7 cm in short axis. These are unchanged from 2014 and of uncertain significance. Would correlate for evidence of underlying systemic inflammatory condition, or  condition such as sarcoidosis.   Electronically Signed   By: Roanna Raider M.D.   On: 12/03/2014 21:02     EKG Interpretation None      MDM   Final diagnoses:  Community acquired pneumonia   Initial concern was for probable PE. The patient had long distance travel history with pleuritic chest pain and dyspnea. CT chest however is more suggestive of a infiltrative process. She also has leukocytosis. Today the patient has developed cough. She'll be treated for community-acquired pneumonia. She does not have other symptoms of headache, myalgia or rash. She is nontoxic and does not have acute respiratory distress. The patient's treatment will be initiated for outpatient management. She is counseled on the need to return if any worsening or dancing symptoms.    Arby Barrette, MD 12/03/14 2306

## 2014-12-03 NOTE — ED Notes (Signed)
Nurse currently starting IV 

## 2014-12-04 ENCOUNTER — Telehealth (HOSPITAL_COMMUNITY): Payer: Self-pay

## 2014-12-04 NOTE — Telephone Encounter (Signed)
Pharmacy calling for clarification of ZPak quantity (#4).  Chart reviewed.  Informed RPh pt rcvd first dose 2 tab (500 mg) in ED.

## 2015-04-14 ENCOUNTER — Other Ambulatory Visit: Payer: Self-pay | Admitting: Family Medicine

## 2015-04-14 DIAGNOSIS — M1990 Unspecified osteoarthritis, unspecified site: Secondary | ICD-10-CM

## 2015-04-14 DIAGNOSIS — M545 Low back pain: Secondary | ICD-10-CM

## 2015-05-01 ENCOUNTER — Ambulatory Visit
Admission: RE | Admit: 2015-05-01 | Discharge: 2015-05-01 | Disposition: A | Discharge status: Home / Self Care | Payer: 59 | Source: Ambulatory Visit | Attending: Family Medicine | Admitting: Family Medicine

## 2015-05-01 DIAGNOSIS — M1990 Unspecified osteoarthritis, unspecified site: Secondary | ICD-10-CM

## 2015-05-01 DIAGNOSIS — M545 Low back pain: Secondary | ICD-10-CM

## 2015-05-07 ENCOUNTER — Emergency Department (HOSPITAL_COMMUNITY)
Admission: EM | Admit: 2015-05-07 | Discharge: 2015-05-07 | Disposition: A | Discharge status: Home / Self Care | Payer: 59 | Attending: Emergency Medicine | Admitting: Emergency Medicine

## 2015-05-07 DIAGNOSIS — Z8709 Personal history of other diseases of the respiratory system: Secondary | ICD-10-CM | POA: Diagnosis not present

## 2015-05-07 DIAGNOSIS — R112 Nausea with vomiting, unspecified: Secondary | ICD-10-CM | POA: Diagnosis present

## 2015-05-07 DIAGNOSIS — Z9049 Acquired absence of other specified parts of digestive tract: Secondary | ICD-10-CM | POA: Insufficient documentation

## 2015-05-07 DIAGNOSIS — Z79899 Other long term (current) drug therapy: Secondary | ICD-10-CM | POA: Diagnosis not present

## 2015-05-07 DIAGNOSIS — F17210 Nicotine dependence, cigarettes, uncomplicated: Secondary | ICD-10-CM | POA: Insufficient documentation

## 2015-05-07 DIAGNOSIS — Z8701 Personal history of pneumonia (recurrent): Secondary | ICD-10-CM | POA: Diagnosis not present

## 2015-05-07 DIAGNOSIS — Z90710 Acquired absence of both cervix and uterus: Secondary | ICD-10-CM | POA: Diagnosis not present

## 2015-05-07 DIAGNOSIS — K529 Noninfective gastroenteritis and colitis, unspecified: Secondary | ICD-10-CM

## 2015-05-07 DIAGNOSIS — G8929 Other chronic pain: Secondary | ICD-10-CM | POA: Insufficient documentation

## 2015-05-07 LAB — COMPREHENSIVE METABOLIC PANEL
ALT: 27 U/L (ref 14–54)
ANION GAP: 11 (ref 5–15)
AST: 36 U/L (ref 15–41)
Albumin: 4.4 g/dL (ref 3.5–5.0)
Alkaline Phosphatase: 80 U/L (ref 38–126)
BILIRUBIN TOTAL: 0.6 mg/dL (ref 0.3–1.2)
BUN: 11 mg/dL (ref 6–20)
CHLORIDE: 103 mmol/L (ref 101–111)
CO2: 21 mmol/L — ABNORMAL LOW (ref 22–32)
Calcium: 9 mg/dL (ref 8.9–10.3)
Creatinine, Ser: 1.15 mg/dL — ABNORMAL HIGH (ref 0.44–1.00)
GFR calc Af Amer: >60 mL/min (ref >60)
GFR, EST NON AFRICAN AMERICAN: 56 mL/min — AB (ref >60)
Glucose, Bld: 127 mg/dL — ABNORMAL HIGH (ref 65–99)
POTASSIUM: 3 mmol/L — AB (ref 3.5–5.1)
Sodium: 135 mmol/L (ref 135–145)
TOTAL PROTEIN: 8.9 g/dL — AB (ref 6.5–8.1)

## 2015-05-07 LAB — URINALYSIS, ROUTINE W REFLEX MICROSCOPIC
GLUCOSE, UA: NEGATIVE mg/dL
Hgb urine dipstick: NEGATIVE
KETONES UR: 15 mg/dL — AB
LEUKOCYTES UA: NEGATIVE
Nitrite: NEGATIVE
Protein, ur: 30 mg/dL — AB
SPECIFIC GRAVITY, URINE: 1.027 (ref 1.005–1.030)
pH: 6 (ref 5.0–8.0)

## 2015-05-07 LAB — CBC
HEMATOCRIT: 51.1 % — AB (ref 36.0–46.0)
HEMOGLOBIN: 17.2 g/dL — AB (ref 12.0–15.0)
MCH: 27.9 pg (ref 26.0–34.0)
MCHC: 33.7 g/dL (ref 30.0–36.0)
MCV: 83 fL (ref 78.0–100.0)
Platelets: 368 10*3/uL (ref 150–400)
RBC: 6.16 MIL/uL — AB (ref 3.87–5.11)
RDW: 14.8 % (ref 11.5–15.5)
WBC: 14.7 10*3/uL — ABNORMAL HIGH (ref 4.0–10.5)

## 2015-05-07 LAB — URINE MICROSCOPIC-ADD ON
Bacteria, UA: NONE SEEN
RBC / HPF: NONE SEEN RBC/hpf (ref 0–5)

## 2015-05-07 LAB — POC OCCULT BLOOD, ED: Fecal Occult Bld: NEGATIVE

## 2015-05-07 LAB — LIPASE, BLOOD: LIPASE: 27 U/L (ref 11–51)

## 2015-05-07 MED ORDER — ONDANSETRON HCL 4 MG PO TABS: 4.0000 mg | ORAL_TABLET | Freq: Four times a day (QID) | ORAL | Status: DC

## 2015-05-07 MED ORDER — SODIUM CHLORIDE 0.9 % IV BOLUS (SEPSIS)
1000.0000 mL | Freq: Once | INTRAVENOUS | Status: AC
  Administered 2015-05-07: 1000 mL via INTRAVENOUS

## 2015-05-07 MED ORDER — ONDANSETRON HCL 4 MG/2ML IJ SOLN
4.0000 mg | Freq: Once | INTRAMUSCULAR | Status: AC
  Administered 2015-05-07: 4 mg via INTRAVENOUS
  Filled 2015-05-07: qty 2

## 2015-05-07 MED ORDER — POTASSIUM CHLORIDE CRYS ER 20 MEQ PO TBCR
40.0000 meq | EXTENDED_RELEASE_TABLET | Freq: Once | ORAL | Status: AC
  Administered 2015-05-07: 40 meq via ORAL
  Filled 2015-05-07: qty 2

## 2015-05-07 MED ORDER — POTASSIUM CHLORIDE 10 MEQ/100ML IV SOLN
10.0000 meq | Freq: Once | INTRAVENOUS | Status: AC
  Administered 2015-05-07: 10 meq via INTRAVENOUS
  Filled 2015-05-07: qty 100

## 2015-05-07 NOTE — ED Notes (Signed)
Pt states that she had had NVD since Thursday. Also c/o mid abdominal pain but feels like it is related to the vomiting. States her stool is very dark. Alert and oriented.

## 2015-05-07 NOTE — ED Notes (Signed)
Pt reports that nausea is gone.

## 2015-05-07 NOTE — Discharge Instructions (Signed)
Keep your fluid intake up. Advance your diet slowly from liquids to solids.   Nausea and Vomiting  Nausea is a sick feeling that often comes before throwing up (vomiting). Vomiting is a reflex where stomach contents come out of your mouth. Vomiting can cause severe loss of body fluids (dehydration). Children and elderly adults can become dehydrated quickly, especially if they also have diarrhea. Nausea and vomiting are symptoms of a condition or disease. It is important to find the cause of your symptoms.  CAUSES  Direct irritation of the stomach lining. This irritation can result from increased acid production (gastroesophageal reflux disease), infection, food poisoning, taking certain medicines (such as nonsteroidal anti-inflammatory drugs), alcohol use, or tobacco use.  Signals from the brain. These signals could be caused by a headache, heat exposure, an inner ear disturbance, increased pressure in the brain from injury, infection, a tumor, or a concussion, pain, emotional stimulus, or metabolic problems.  An obstruction in the gastrointestinal tract (bowel obstruction).  Illnesses such as diabetes, hepatitis, gallbladder problems, appendicitis, kidney problems, cancer, sepsis, atypical symptoms of a heart attack, or eating disorders.  Medical treatments such as chemotherapy and radiation.  Receiving medicine that makes you sleep (general anesthetic) during surgery. DIAGNOSIS  Your caregiver may ask for tests to be done if the problems do not improve after a few days. Tests may also be done if symptoms are severe or if the reason for the nausea and vomiting is not clear. Tests may include:  Urine tests.  Blood tests.  Stool tests.  Cultures (to look for evidence of infection).  X-rays or other imaging studies. Test results can help your caregiver make decisions about treatment or the need for additional tests.  TREATMENT  You need to stay well hydrated. Drink frequently but in small amounts.  You may wish to drink water, sports drinks, clear broth, or eat frozen ice pops or gelatin dessert to help stay hydrated. When you eat, eating slowly may help prevent nausea. There are also some antinausea medicines that may help prevent nausea.  HOME CARE INSTRUCTIONS  Take all medicine as directed by your caregiver.  If you do not have an appetite, do not force yourself to eat. However, you must continue to drink fluids.  If you have an appetite, eat a normal diet unless your caregiver tells you differently.  Eat a variety of complex carbohydrates (rice, wheat, potatoes, bread), lean meats, yogurt, fruits, and vegetables.  Avoid high-fat foods because they are more difficult to digest. Drink enough water and fluids to keep your urine clear or pale yellow.  If you are dehydrated, ask your caregiver for specific rehydration instructions. Signs of dehydration may include:  Severe thirst.  Dry lips and mouth.  Dizziness.  Dark urine.  Decreasing urine frequency and amount.  Confusion.  Rapid breathing or pulse. SEEK IMMEDIATE MEDICAL CARE IF:  You have blood or brown flecks (like coffee grounds) in your vomit.  You have black or bloody stools.  You have a severe headache or stiff neck.  You are confused.  You have severe abdominal pain.  You have chest pain or trouble breathing.  You do not urinate at least once every 8 hours.  You develop cold or clammy skin.  You continue to vomit for longer than 24 to 48 hours.  You have a fever. MAKE SURE YOU:  Understand these instructions.  Will watch your condition.  Will get help right away if you are not doing well or get worse.  This information is not intended to replace advice given to you by your health care provider. Make sure you discuss any questions you have with your health care provider.  Document Released: 05/20/2005 Document Revised: 08/12/2011 Document Reviewed: 10/17/2010  Elsevier Interactive Patient Education 2016 [employer]    Diarrhea  Diarrhea is frequent loose and watery bowel movements. It can cause you to feel weak and dehydrated. Dehydration can cause you to become tired and thirsty, have a dry mouth, and have decreased urination that often is dark yellow. Diarrhea is a sign of another problem, most often an infection that will not last long. In most cases, diarrhea typically lasts 2-3 days. However, it can last longer if it is a sign of something more serious. It is important to treat your diarrhea as directed by your caregiver to lessen or prevent future episodes of diarrhea.  CAUSES  Some common causes include:  Gastrointestinal infections caused by viruses, bacteria, or parasites.  Food poisoning or food allergies.  Certain medicines, such as antibiotics, chemotherapy, and laxatives.  Artificial sweeteners and fructose.  Digestive disorders. HOME CARE INSTRUCTIONS  Ensure adequate fluid intake (hydration): Have 1 cup (8 oz) of fluid for each diarrhea episode. Avoid fluids that contain simple sugars or sports drinks, fruit juices, whole milk products, and sodas. Your urine should be clear or pale yellow if you are drinking enough fluids. Hydrate with an oral rehydration solution that you can purchase at pharmacies, retail stores, and online. You can prepare an oral rehydration solution at home by mixing the following ingredients together:  - tsp table salt.   tsp baking soda.  tsp salt substitute containing potassium chloride.  1 tablespoons sugar.  1 L (34 oz) of water. Certain foods and beverages may increase the speed at which food moves through the gastrointestinal (GI) tract. These foods and beverages should be avoided and include:  Caffeinated and alcoholic beverages.  High-fiber foods, such as raw fruits and vegetables, nuts, seeds, and whole grain breads and cereals.  Foods and beverages sweetened with sugar alcohols, such as xylitol, sorbitol, and mannitol. Some foods may be well tolerated and  may help thicken stool including:  Starchy foods, such as rice, toast, pasta, low-sugar cereal, oatmeal, grits, baked potatoes, crackers, and bagels.  Bananas.  Applesauce. Add probiotic-rich foods to help increase healthy bacteria in the GI tract, such as yogurt and fermented milk products.  Wash your hands well after each diarrhea episode.  Only take over-the-counter or prescription medicines as directed by your caregiver.  Take a warm bath to relieve any burning or pain from frequent diarrhea episodes. SEEK IMMEDIATE MEDICAL CARE IF:  You are unable to keep fluids down.  You have persistent vomiting.  You have blood in your stool, or your stools are black and tarry.  You do not urinate in 6-8 hours, or there is only a small amount of very dark urine.  You have abdominal pain that increases or localizes.  You have weakness, dizziness, confusion, or light-headedness.  You have a severe headache.  Your diarrhea gets worse or does not get better.  You have a fever or persistent symptoms for more than 2-3 days.  You have a fever and your symptoms suddenly get worse. MAKE SURE YOU:  Understand these instructions.  Will watch your condition.  Will get help right away if you are not doing well or get worse. This information is not intended to replace advice given to you by your health care provider. Make  sure you discuss any questions you have with your health care provider.  Document Released: 05/10/2002 Document Revised: 06/10/2014 Document Reviewed: 01/26/2012  Elsevier Interactive Patient Education 2016 [employer].     Food Choices to Help Relieve Diarrhea, Adult When you have diarrhea, the foods you eat and your eating habits are very important. Choosing the right foods and drinks can help relieve diarrhea. Also, because diarrhea can last up to 7 days, you need to replace lost fluids and electrolytes (such as sodium, potassium, and chloride) in order to help prevent dehydration.    WHAT GENERAL GUIDELINES DO I NEED TO FOLLOW?  Slowly drink 1 cup (8 oz) of fluid for each episode of diarrhea. If you are getting enough fluid, your urine will be clear or pale yellow.  Eat starchy foods. Some good choices include white rice, white toast, pasta, low-fiber cereal, baked potatoes (without the skin), saltine crackers, and bagels.  Avoid large servings of any cooked vegetables.  Limit fruit to two servings per day. A serving is  cup or 1 small piece.  Choose foods with less than 2 g of fiber per serving.  Limit fats to less than 8 tsp (38 g) per day.  Avoid fried foods.  Eat foods that have probiotics in them. Probiotics can be found in certain dairy products.  Avoid foods and beverages that may increase the speed at which food moves through the stomach and intestines (gastrointestinal tract). Things to avoid include:  High-fiber foods, such as dried fruit, raw fruits and vegetables, nuts, seeds, and whole grain foods.  Spicy foods and high-fat foods.  Foods and beverages sweetened with high-fructose corn syrup, honey, or sugar alcohols such as xylitol, sorbitol, and mannitol. WHAT FOODS ARE RECOMMENDED? Grains White rice. White, [place], or pita breads (fresh or toasted), including plain rolls, buns, or bagels. White pasta. Saltine, soda, or graham crackers. Pretzels. Low-fiber cereal. Cooked cereals made with water (such as cornmeal, farina, or cream cereals). Plain muffins. Matzo. Melba toast. Zwieback.  Vegetables Potatoes (without the skin). Strained tomato and vegetable juices. Most well-cooked and canned vegetables without seeds. Tender lettuce. Fruits Cooked or canned applesauce, apricots, cherries, fruit cocktail, grapefruit, peaches, pears, or plums. Fresh bananas, apples without skin, cherries, grapes, cantaloupe, grapefruit, peaches, oranges, or plums.  Meat and Other Protein Products Baked or boiled chicken. Eggs. Tofu. Fish. Seafood. Smooth peanut butter.  Ground or well-cooked tender beef, ham, veal, lamb, pork, or poultry.  Dairy Plain yogurt, kefir, and unsweetened liquid yogurt. Lactose-free milk, buttermilk, or soy milk. Plain hard cheese. Beverages Sport drinks. Clear broths. Diluted fruit juices (except prune). Regular, caffeine-free sodas such as ginger ale. Water. Decaffeinated teas. Oral rehydration solutions. Sugar-free beverages not sweetened with sugar alcohols. Other Bouillon, broth, or soups made from recommended foods.  The items listed above may not be a complete list of recommended foods or beverages. Contact your dietitian for more options. WHAT FOODS ARE NOT RECOMMENDED? Grains Whole grain, whole wheat, bran, or rye breads, rolls, pastas, crackers, and cereals. Wild or brown rice. Cereals that contain more than 2 g of fiber per serving. Corn tortillas or taco shells. Cooked or dry oatmeal. Granola. Popcorn. Vegetables Raw vegetables. Cabbage, broccoli, Brussels sprouts, artichokes, baked beans, beet greens, corn, kale, legumes, peas, sweet potatoes, and yams. Potato skins. Cooked spinach and cabbage. Fruits Dried fruit, including raisins and dates. Raw fruits. Stewed or dried prunes. Fresh apples with skin, apricots, mangoes, pears, raspberries, and strawberries.  Meat and Other Protein Products Chunky peanut  butter. Nuts and seeds. Beans and lentils. Tomasa Blase.  Dairy High-fat cheeses. Milk, chocolate milk, and beverages made with milk, such as milk shakes. Cream. Ice cream. Sweets and Desserts Sweet rolls, doughnuts, and sweet breads. Pancakes and waffles. Fats and Oils Butter. Cream sauces. Margarine. Salad oils. Plain salad dressings. Olives. Avocados.  Beverages Caffeinated beverages (such as coffee, tea, soda, or energy drinks). Alcoholic beverages. Fruit juices with pulp. Prune juice. Soft drinks sweetened with high-fructose corn syrup or sugar alcohols. Other Coconut. Hot sauce. Chili powder. Mayonnaise. Gravy.  Cream-based or milk-based soups.  The items listed above may not be a complete list of foods and beverages to avoid. Contact your dietitian for more information. WHAT SHOULD I DO IF I BECOME DEHYDRATED? Diarrhea can sometimes lead to dehydration. Signs of dehydration include dark urine and dry mouth and skin. If you think you are dehydrated, you should rehydrate with an oral rehydration solution. These solutions can be purchased at pharmacies, retail stores, or online.  Drink -1 cup (120-240 mL) of oral rehydration solution each time you have an episode of diarrhea. If drinking this amount makes your diarrhea worse, try drinking smaller amounts more often. For example, drink 1-3 tsp (5-15 mL) every 5-10 minutes.  A general rule for staying hydrated is to drink 1-2 L of fluid per day. Talk to your health care provider about the specific amount you should be drinking each day. Drink enough fluids to keep your urine clear or pale yellow.   This information is not intended to replace advice given to you by your health care provider. Make sure you discuss any questions you have with your health care provider.   Document Released: 08/10/2003 Document Revised: 06/10/2014 Document Reviewed: 04/12/2013 Elsevier Interactive Patient Education [employer]

## 2015-05-07 NOTE — ED Provider Notes (Signed)
CSN: [id number]     Arrival date & time 05/07/15  1442 History   First MD Initiated Contact with Patient 05/07/15 1703     Chief Complaint  Patient presents with  . Emesis  . Diarrhea   HPI  Kelsey Morgan is a 44 year old female with a past medical history of chronic lower back pain presenting with nausea, vomiting and diarrhea. She states that she began vomiting approximately 3 days ago. She denies blood in her vomit. She is also having watery stools multiple times a day. She states that the nausea so severe she has not been able to keep down solid foods in 3 days. She attempted to drink soup last evening but vomited afterwards. She states that she has had some upper abdominal pain that developed since the nausea and vomiting presented. She states the pain is like a muscle strain and is exacerbated when she vomits. She notes that she has "drank probably an entire bottle of Pepto-Bismol" and reports black stools. Denies history of GI bleeds, blood thinners or excessive use of NSAIDs. Denies fevers but states she has been very cold over the past few days. Denies headaches, dizziness, syncope, congestion, sore throat, chest pain, shortness of breath or dysuria.  Past Medical History  Diagnosis Date  . Bronchitis   . Pneumonia   . Back pain    Past Surgical History  Procedure Laterality Date  . Cholecystectomy    . Abdominal hysterectomy    . Ankle surgery    . Knee surgery     No family history on file. Social History  Substance Use Topics  . Smoking status: Current Every Day Smoker -- 0.50 packs/day for 25 years    Types: Cigarettes  . Smokeless tobacco: Not on file  . Alcohol Use: No   OB History    No data available     Review of Systems  Constitutional: Positive for chills.  HENT: Negative.   Respiratory: Negative for shortness of breath.   Cardiovascular: Negative for chest pain.  Gastrointestinal: Positive for nausea, vomiting, abdominal pain and diarrhea.  Genitourinary:  Negative for dysuria, hematuria and flank pain.  Skin: Negative for rash.  Neurological: Negative for syncope, weakness, light-headedness and headaches.  All other systems reviewed and are negative.     Allergies  Percocet and Vicodin  Home Medications   Prior to Admission medications   Medication Sig Start Date End Date Taking? Authorizing Provider  acetaminophen (TYLENOL) 500 MG tablet Take 500 mg by mouth every 6 (six) hours as needed for pain.   Yes Historical Provider, MD  albuterol (PROVENTIL HFA;VENTOLIN HFA) 108 (90 BASE) MCG/ACT inhaler Inhale 1-2 puffs into the lungs every 6 (six) hours as needed for wheezing or shortness of breath. 12/03/14  Yes Arby Barrette, MD  azelastine (ASTELIN) 137 MCG/SPRAY nasal spray Place 1 spray into both nostrils 2 (two) times daily as needed for allergies. Use in each nostril as directed   Yes Historical Provider, MD  sertraline (ZOLOFT) 100 MG tablet Take 100 mg by mouth daily.   Yes Historical Provider, MD  ibuprofen (ADVIL,MOTRIN) 800 MG tablet Take 1 tablet (800 mg total) by mouth 3 (three) times daily. Patient not taking: Reported on 05/07/2015 12/03/14   Arby Barrette, MD  ondansetron (ZOFRAN) 4 MG tablet Take 1 tablet (4 mg total) by mouth every 6 (six) hours. 05/07/15   Rolm Gala Alayla Dethlefs, PA-C  Spacer/Aero-Holding Chambers (AEROCHAMBER PLUS WITH MASK) inhaler Use as instructed Patient not taking: Reported on 05/07/2015 12/03/14  Arby BarretteMarcy Pfeiffer, MD   BP 117/84 mmHg  Pulse 82  Temp(Src) 97.8 F (36.6 C) (Oral)  Resp 16  SpO2 94% Physical Exam  Constitutional: She appears well-developed and well-nourished. No distress.  Nontoxic, nonseptic appearing  HENT:  Head: Normocephalic and atraumatic.  Eyes: Conjunctivae are normal. Right eye exhibits no discharge. Left eye exhibits no discharge. No scleral icterus.  Neck: Normal range of motion.  Cardiovascular: Normal rate, regular rhythm and normal heart sounds.   Pulmonary/Chest: Effort normal  and breath sounds normal. No respiratory distress. She has no wheezes. She has no rales.  Abdominal: Soft. There is tenderness in the right upper quadrant, epigastric area, periumbilical area and left upper quadrant. There is no rigidity, no rebound, no guarding, no tenderness at McBurney's point and negative Murphy's sign.    Mild, generalized tenderness in the upper quadrants of the abdomen. No peritoneal signs.   Musculoskeletal: Normal range of motion.  Moves all extremities spontaneously  Neurological: She is alert. Coordination normal.  Skin: Skin is warm and dry. She is not diaphoretic.  Psychiatric: She has a normal mood and affect. Her behavior is normal.  Nursing note and vitals reviewed.   ED Course  Procedures (including critical care time) Labs Review Labs Reviewed  COMPREHENSIVE METABOLIC PANEL - Abnormal; Notable for the following:    Potassium 3.0 (*)    CO2 21 (*)    Glucose, Bld 127 (*)    Creatinine, Ser 1.15 (*)    Total Protein 8.9 (*)    GFR calc non Af Amer 56 (*)    All other components within normal limits  CBC - Abnormal; Notable for the following:    WBC 14.7 (*)    RBC 6.16 (*)    Hemoglobin 17.2 (*)    HCT 51.1 (*)    All other components within normal limits  URINALYSIS, ROUTINE W REFLEX MICROSCOPIC (NOT AT [facility]) - Abnormal; Notable for the following:    Color, Urine AMBER (*)    Bilirubin Urine SMALL (*)    Ketones, ur 15 (*)    Protein, ur 30 (*)    All other components within normal limits  URINE MICROSCOPIC-ADD ON - Abnormal; Notable for the following:    Squamous Epithelial / LPF 0-5 (*)    Casts HYALINE CASTS (*)    All other components within normal limits  LIPASE, BLOOD  POC OCCULT BLOOD, ED    Imaging Review No results found. I have personally reviewed and evaluated these images and lab results as part of my medical decision-making.   EKG Interpretation None      MDM   Final diagnoses:  Gastroenteritis   44 year old  female presenting with nausea, vomiting and diarrhea 3 days. Also complaining of upper abdominal soreness associated with her vomiting episodes. She also notes taking pepto bismol for her symptoms which resulted in black stool. VSS. Patient is nontoxic, nonseptic appearing, in no apparent distress. Patient's pain and other symptoms adequately managed in emergency department. She reports full resolution of her nausea and has not vomited or had diarrhea in the emergency department. She also reports improvement in her epigastric soreness. 2 L bolus given. Labs, imaging and vitals reviewed. Patient appears to be moderately dehydrated. Potassium low at 3.0 which was repleted in the emergency department. Hemoccult negative. Gastroenteritis likely source of her symptoms. Patient does not meet the SIRS or Sepsis criteria.  On repeat exam patient does not have a surgical abdomen and there are no  peritoneal signs suggesting surgical abdomen. Patient discharged home with symptomatic treatment and given strict instructions for follow-up with their primary care physician.  I have also discussed reasons to return immediately to the ER.  Patient expresses understanding and agrees with plan.      Rolm Gala Jaysa Kise, PA-C 05/07/15 2109  Leta Baptist, MD 05/08/15 [phone]

## 2015-12-25 ENCOUNTER — Encounter (HOSPITAL_COMMUNITY): Payer: Self-pay | Admitting: Emergency Medicine

## 2015-12-25 ENCOUNTER — Emergency Department (HOSPITAL_COMMUNITY): Payer: 59

## 2015-12-25 ENCOUNTER — Emergency Department (HOSPITAL_COMMUNITY)
Admission: EM | Admit: 2015-12-25 | Discharge: 2015-12-25 | Disposition: A | Discharge status: Home / Self Care | Payer: 59 | Attending: Emergency Medicine | Admitting: Emergency Medicine

## 2015-12-25 DIAGNOSIS — F17210 Nicotine dependence, cigarettes, uncomplicated: Secondary | ICD-10-CM | POA: Insufficient documentation

## 2015-12-25 DIAGNOSIS — R51 Headache: Secondary | ICD-10-CM | POA: Diagnosis not present

## 2015-12-25 DIAGNOSIS — Z5181 Encounter for therapeutic drug level monitoring: Secondary | ICD-10-CM | POA: Diagnosis not present

## 2015-12-25 DIAGNOSIS — Z79899 Other long term (current) drug therapy: Secondary | ICD-10-CM | POA: Diagnosis not present

## 2015-12-25 DIAGNOSIS — R519 Headache, unspecified: Secondary | ICD-10-CM

## 2015-12-25 DIAGNOSIS — Z7982 Long term (current) use of aspirin: Secondary | ICD-10-CM | POA: Diagnosis not present

## 2015-12-25 DIAGNOSIS — R531 Weakness: Secondary | ICD-10-CM | POA: Diagnosis present

## 2015-12-25 LAB — I-STAT CHEM 8, ED
BUN: 11 mg/dL (ref 6–20)
Calcium, Ion: 1.1 mmol/L — ABNORMAL LOW (ref 1.13–1.30)
Chloride: 108 mmol/L (ref 101–111)
Creatinine, Ser: 0.9 mg/dL (ref 0.44–1.00)
Glucose, Bld: 87 mg/dL (ref 65–99)
HEMATOCRIT: 42 % (ref 36.0–46.0)
HEMOGLOBIN: 14.3 g/dL (ref 12.0–15.0)
POTASSIUM: 3.6 mmol/L (ref 3.5–5.1)
SODIUM: 144 mmol/L (ref 135–145)
TCO2: 23 mmol/L (ref 0–100)

## 2015-12-25 LAB — DIFFERENTIAL
BASOS ABS: 0 10*3/uL (ref 0.0–0.1)
Basophils Relative: 0 %
EOS ABS: 0.3 10*3/uL (ref 0.0–0.7)
EOS PCT: 3 %
LYMPHS ABS: 4 10*3/uL (ref 0.7–4.0)
LYMPHS PCT: 30 %
Monocytes Absolute: 0.8 10*3/uL (ref 0.1–1.0)
Monocytes Relative: 6 %
NEUTROS PCT: 61 %
Neutro Abs: 8.3 10*3/uL — ABNORMAL HIGH (ref 1.7–7.7)

## 2015-12-25 LAB — COMPREHENSIVE METABOLIC PANEL
ALBUMIN: 3.6 g/dL (ref 3.5–5.0)
ALK PHOS: 70 U/L (ref 38–126)
ALT: 18 U/L (ref 14–54)
ANION GAP: 8 (ref 5–15)
AST: 21 U/L (ref 15–41)
BUN: 10 mg/dL (ref 6–20)
CO2: 22 mmol/L (ref 22–32)
Calcium: 8.6 mg/dL — ABNORMAL LOW (ref 8.9–10.3)
Chloride: 111 mmol/L (ref 101–111)
Creatinine, Ser: 0.93 mg/dL (ref 0.44–1.00)
GFR calc Af Amer: >60 mL/min (ref >60)
GFR calc non Af Amer: >60 mL/min (ref >60)
GLUCOSE: 87 mg/dL (ref 65–99)
POTASSIUM: 3.6 mmol/L (ref 3.5–5.1)
SODIUM: 141 mmol/L (ref 135–145)
Total Bilirubin: 0.5 mg/dL (ref 0.3–1.2)
Total Protein: 7.2 g/dL (ref 6.5–8.1)

## 2015-12-25 LAB — I-STAT TROPONIN, ED: Troponin i, poc: 0 ng/mL (ref 0.00–0.08)

## 2015-12-25 LAB — CBG MONITORING, ED: GLUCOSE-CAPILLARY: 96 mg/dL (ref 65–99)

## 2015-12-25 LAB — CBC
HCT: 41.8 % (ref 36.0–46.0)
HEMOGLOBIN: 13.4 g/dL (ref 12.0–15.0)
MCH: 27.4 pg (ref 26.0–34.0)
MCHC: 32.1 g/dL (ref 30.0–36.0)
MCV: 85.5 fL (ref 78.0–100.0)
PLATELETS: 378 10*3/uL (ref 150–400)
RBC: 4.89 MIL/uL (ref 3.87–5.11)
RDW: 14.2 % (ref 11.5–15.5)
WBC: 13.4 10*3/uL — ABNORMAL HIGH (ref 4.0–10.5)

## 2015-12-25 LAB — PROTIME-INR
INR: 0.99 (ref 0.00–1.49)
PROTHROMBIN TIME: 13.3 s (ref 11.6–15.2)

## 2015-12-25 LAB — APTT: APTT: 37 s (ref 24–37)

## 2015-12-25 MED ORDER — BUTALBITAL-APAP-CAFFEINE 50-325-40 MG PO TABS: 1.0000 | ORAL_TABLET | Freq: Four times a day (QID) | ORAL | 0 refills | Status: DC | PRN

## 2015-12-25 NOTE — ED Notes (Signed)
First neuro check with NIH scale.

## 2015-12-25 NOTE — ED Notes (Signed)
MD at bedside. 

## 2015-12-25 NOTE — ED Provider Notes (Signed)
[facility]-EMERGENCY DEPT Provider Note   CSN: [id number] Arrival date & time: 12/25/15  2018  First Provider Contact:  First MD Initiated Contact with Patient 12/25/15 2230      History   Chief Complaint Chief Complaint  Patient presents with  . Weakness  . Migraine    HPI Kelsey Morgan is a 45 y.o. female.   Migraine  This is a recurrent problem. The current episode started more than 2 days ago. The problem occurs constantly. The problem has not changed since onset.Associated symptoms include headaches. Pertinent negatives include no chest pain. Nothing relieves the symptoms. She has tried nothing for the symptoms. The treatment provided no relief.    Past Medical History:  Diagnosis Date  . Back pain   . Bronchitis   . Pneumonia     There are no active problems to display for this patient.   Past Surgical History:  Procedure Laterality Date  . ABDOMINAL HYSTERECTOMY    . ANKLE SURGERY    . CHOLECYSTECTOMY    . KNEE SURGERY      OB History    No data available       Home Medications    Prior to Admission medications   Medication Sig Start Date End Date Taking? Authorizing Provider  acetaminophen (TYLENOL) 500 MG tablet Take 500 mg by mouth every 6 (six) hours as needed for pain.   Yes Historical Provider, MD  albuterol (PROVENTIL HFA;VENTOLIN HFA) 108 (90 BASE) MCG/ACT inhaler Inhale 1-2 puffs into the lungs every 6 (six) hours as needed for wheezing or shortness of breath. 12/03/14  Yes Arby Barrette, MD  aspirin-acetaminophen-caffeine (EXCEDRIN MIGRAINE) [phone] MG tablet Take 1 tablet by mouth every 6 (six) hours as needed for headache.   Yes Historical Provider, MD  azelastine (ASTELIN) 137 MCG/SPRAY nasal spray Place 1 spray into both nostrils 2 (two) times daily as needed for allergies. Use in each nostril as directed   Yes Historical Provider, MD  pseudoephedrine-acetaminophen (TYLENOL SINUS) 30-500 MG TABS tablet Take 1 tablet by mouth every 4 (four)  hours as needed (sinus).   Yes Historical Provider, MD  sertraline (ZOLOFT) 100 MG tablet Take 100 mg by mouth daily.   Yes Historical Provider, MD  tiZANidine (ZANAFLEX) 4 MG tablet Take 1 tablet by mouth every 8 (eight) hours as needed for muscle spasms.  12/01/15  Yes Historical Provider, MD  butalbital-acetaminophen-caffeine (FIORICET) 50-325-40 MG tablet Take 1-2 tablets by mouth every 6 (six) hours as needed for headache. 12/25/15 12/24/16  Marily Memos, MD    Family History No family history on file.  Social History Social History  Substance Use Topics  . Smoking status: Current Every Day Smoker    Packs/day: 0.50    Years: 25.00    Types: Cigarettes  . Smokeless tobacco: Never Used  . Alcohol use No     Allergies   Percocet [oxycodone-acetaminophen] and Vicodin [hydrocodone-acetaminophen]   Review of Systems Review of Systems  Cardiovascular: Negative for chest pain.  Neurological: Positive for headaches.  All other systems reviewed and are negative.    Physical Exam Updated Vital Signs BP 144/93   Pulse (!) 51   Temp 98.2 F (36.8 C) (Oral)   Resp 18   Ht  (1.676 m)   Wt 281 lb (127.5 kg)   SpO2 98%   BMI 45.35 kg/m   Physical Exam  Constitutional: She is oriented to person, place, and time. She appears well-developed and well-nourished.  HENT:  Head:  Normocephalic and atraumatic.  Neck: Normal range of motion.  Cardiovascular: Normal rate and regular rhythm.   No murmur heard. Pulmonary/Chest: No stridor. No respiratory distress.  Abdominal: She exhibits no distension.  Neurological: She is alert and oriented to person, place, and time.  No altered mental status, able to give full seemingly accurate history.  Face is symmetric, EOM's intact, pupils equal and reactive, vision intact, tongue and uvula midline without deviation Upper and Lower extremity motor 5/5, intact pain perception in distal extremities, 2+ reflexes in biceps, patella and  achilles tendons. Finger to nose normal, heel to shin normal. Walks without assistance or evident ataxia.   Skin: Skin is warm and dry.  Nursing note and vitals reviewed.    ED Treatments / Results  Labs (all labs ordered are listed, but only abnormal results are displayed) Labs Reviewed  CBC - Abnormal; Notable for the following:       Result Value   WBC 13.4 (*)    All other components within normal limits  DIFFERENTIAL - Abnormal; Notable for the following:    Neutro Abs 8.3 (*)    All other components within normal limits  COMPREHENSIVE METABOLIC PANEL - Abnormal; Notable for the following:    Calcium 8.6 (*)    All other components within normal limits  I-STAT CHEM 8, ED - Abnormal; Notable for the following:    Calcium, Ion 1.10 (*)    All other components within normal limits  PROTIME-INR  APTT  I-STAT TROPOININ, ED  CBG MONITORING, ED    EKG  EKG Interpretation None       Radiology Ct Head Wo Contrast  Result Date: 12/25/2015 CLINICAL DATA:  Left-sided numbness and tingling tonight. EXAM: CT HEAD WITHOUT CONTRAST TECHNIQUE: Contiguous axial images were obtained from the base of the skull through the vertex without intravenous contrast. COMPARISON:  08/13/2009 FINDINGS: The ventricles are normal in size and configuration. No extra-axial fluid collections are identified. The gray-white differentiation is normal. No CT findings for acute intracranial process such as hemorrhage or infarction. No mass lesions. The brainstem and cerebellum are grossly normal. The bony structures are intact. The paranasal sinuses and mastoid air cells are clear. The globes are intact. IMPRESSION: Normal head CT. Electronically Signed   By: Rudie Meyer M.D.   On: 12/25/2015 21:18   Procedures Procedures (including critical care time)  Medications Ordered in ED Medications - No data to display   Initial Impression / Assessment and Plan / ED Course  I have reviewed the triage vital  signs and the nursing notes.  Pertinent labs & imaging results that were available during my care of the patient were reviewed by me and considered in my medical decision making (see chart for details).  Clinical Course   Headache, normal ct, normal neuro exam - suspect possible migraine, will give short script for fioricet and pcp follow up. Doubt sah, meninigitis, tumor, CVA or other emergent pathology at this time.    Final Clinical Impressions(s) / ED Diagnoses   Final diagnoses:  Nonintractable headache, unspecified chronicity pattern, unspecified headache type    New Prescriptions Discharge Medication List as of 12/25/2015 10:45 PM    START taking these medications   Details  butalbital-acetaminophen-caffeine (FIORICET) 50-325-40 MG tablet Take 1-2 tablets by mouth every 6 (six) hours as needed for headache., Starting Mon 12/25/2015, Until Tue 12/24/2016, Print         Marily Memos, MD 12/25/15 [phone]

## 2015-12-25 NOTE — ED Triage Notes (Addendum)
Pt from home with complaints of left sided numbness. Pt reports a tingling sensation when she is touched on her left side. Pt has facial symmetry and pt has equal bilateral strength. Pt has adequate coordination and normal speech. Pt denies hx of DM or HTN but is hypertensive at time of assessment  At 159/100. Pt also reports feeling as if her heart is "fluttering"

## 2016-10-11 ENCOUNTER — Emergency Department (HOSPITAL_COMMUNITY): Payer: 59

## 2016-10-11 ENCOUNTER — Emergency Department (HOSPITAL_COMMUNITY)
Admission: EM | Admit: 2016-10-11 | Discharge: 2016-10-11 | Disposition: A | Discharge status: Home / Self Care | Payer: 59 | Attending: Emergency Medicine | Admitting: Emergency Medicine

## 2016-10-11 ENCOUNTER — Encounter (HOSPITAL_COMMUNITY): Payer: Self-pay | Admitting: Emergency Medicine

## 2016-10-11 DIAGNOSIS — F17210 Nicotine dependence, cigarettes, uncomplicated: Secondary | ICD-10-CM | POA: Insufficient documentation

## 2016-10-11 DIAGNOSIS — Z79899 Other long term (current) drug therapy: Secondary | ICD-10-CM | POA: Diagnosis not present

## 2016-10-11 DIAGNOSIS — J40 Bronchitis, not specified as acute or chronic: Secondary | ICD-10-CM | POA: Insufficient documentation

## 2016-10-11 DIAGNOSIS — R05 Cough: Secondary | ICD-10-CM | POA: Diagnosis present

## 2016-10-11 DIAGNOSIS — Z7982 Long term (current) use of aspirin: Secondary | ICD-10-CM | POA: Diagnosis not present

## 2016-10-11 MED ORDER — GUAIFENESIN-DM 100-10 MG/5ML PO SYRP: 5.0000 mL | ORAL_SOLUTION | Freq: Three times a day (TID) | ORAL | 0 refills | Status: DC | PRN

## 2016-10-11 MED ORDER — PREDNISONE 20 MG PO TABS: 40.0000 mg | ORAL_TABLET | Freq: Every day | ORAL | 0 refills | Status: DC

## 2016-10-11 NOTE — ED Provider Notes (Signed)
[facility]-EMERGENCY DEPT Provider Note   CSN: [id number] Arrival date & time: 10/11/16  0920     History   Chief Complaint Chief Complaint  Patient presents with  . Cough    HPI Kelsey Morgan is a 46 y.o. female.  HPI Patient presents with cough and sore throat for last month. Has been on antibiotics without improvement. Initially sputum was green was having fevers. [facility] and by Dr. Wynelle Linksun, her primary care doctor. Sputum is now changed to white. Has no swelling in her legs. She does not smoke but is a former smoker. She is a [employer] architecttruck driver. No history of asthma.   Past Medical History:  Diagnosis Date  . Back pain   . Bronchitis   . Pneumonia     There are no active problems to display for this patient.   Past Surgical History:  Procedure Laterality Date  . ABDOMINAL HYSTERECTOMY    . ANKLE SURGERY    . CHOLECYSTECTOMY    . KNEE SURGERY      OB History    No data available       Home Medications    Prior to Admission medications   Medication Sig Start Date End Date Taking? Authorizing Provider  albuterol (PROVENTIL HFA;VENTOLIN HFA) 108 (90 BASE) MCG/ACT inhaler Inhale 1-2 puffs into the lungs every 6 (six) hours as needed for wheezing or shortness of breath. Patient taking differently: Inhale 2 puffs into the lungs every 4 (four) hours as needed for wheezing or shortness of breath.  12/03/14  Yes Arby BarrettePfeiffer, Marcy, MD  albuterol (PROVENTIL) (2.5 MG/3ML) 0.083% nebulizer solution Take 2.5 mg by nebulization every 6 (six) hours as needed for wheezing or shortness of breath.   Yes [provider]  aspirin 81 MG chewable tablet Chew 81 mg by mouth daily.   Yes [provider]  aspirin-acetaminophen-caffeine (EXCEDRIN EXTRA STRENGTH) [phone] MG tablet Take 2 tablets by mouth every 6 (six) hours as needed for headache or migraine (and pain).   Yes [provider]  benzonatate (TESSALON) 100 MG capsule Take 1-2 capsules by  mouth every 8 (eight) hours as needed for cough. 09/25/16  Yes [provider]  cefUROXime (CEFTIN) 500 MG tablet Take 500 mg by mouth 2 (two) times daily. Started 05/04 for 10 days   Yes [provider]  dextromethorphan (DELSYM) 30 MG/5ML liquid Take 60 mg by mouth every 12 (twelve) hours as needed for cough.   Yes [provider]  fluticasone (FLONASE) 50 MCG/ACT nasal spray Place 2 sprays into both nostrils daily.   Yes [provider]  Multiple Vitamin (MULTIVITAMIN WITH MINERALS) TABS tablet Take 1 tablet by mouth daily.   Yes [provider]  sertraline (ZOLOFT) 100 MG tablet Take 100 mg by mouth at bedtime.    Yes [provider]  tiZANidine (ZANAFLEX) 4 MG tablet Take 8 mg by mouth daily.  12/01/15  Yes [provider]  guaiFENesin-dextromethorphan (ROBITUSSIN DM) 100-10 MG/5ML syrup Take 5 mLs by mouth 3 (three) times daily as needed for cough. 10/11/16   Benjiman CorePickering, Derran Sear, MD  predniSONE (DELTASONE) 20 MG tablet Take 2 tablets (40 mg total) by mouth daily. 10/11/16   Benjiman CorePickering, Takako Minckler, MD    Family History No family history on file.  Social History Social History  Substance Use Topics  . Smoking status: Current Every Day Smoker    Packs/day: 0.50    Years: 25.00    Types: Cigarettes  .  Smokeless tobacco: Never Used  . Alcohol use No     Allergies   Percocet [oxycodone-acetaminophen] and Vicodin [hydrocodone-acetaminophen]   Review of Systems Review of Systems  Constitutional: Negative for appetite change and fever.  HENT: Positive for sore throat.   Respiratory: Positive for cough and shortness of breath.   Cardiovascular: Negative for chest pain and leg swelling.  Gastrointestinal: Negative for abdominal pain and anal bleeding.  Genitourinary: Negative for dysuria.  Musculoskeletal: Negative for back pain.  Neurological: Negative for seizures.  Hematological: Negative for adenopathy.    Psychiatric/Behavioral: Negative for confusion.     Physical Exam Updated Vital Signs BP (!) 139/95 (BP Location: Left Arm)   Pulse 82   Temp 99.1 F (37.3 C) (Oral)   Resp 20   Ht 5\' 6"  (1.676 m)   Wt 294 lb (133.4 kg)   SpO2 98%   BMI 47.45 kg/m   Physical Exam  Constitutional: She appears well-developed.  HENT:  Mild posterior pharyngeal erythema without exudate.  Eyes: Pupils are equal, round, and reactive to light.  Neck: Neck supple.  Mild anterior cervical lymphadenopathy.  Cardiovascular: Normal rate.   Pulmonary/Chest: Effort normal. No respiratory distress. She has no wheezes.  Abdominal: Soft. There is no tenderness.  Musculoskeletal: She exhibits no edema.  Neurological: She is alert.  Skin: Skin is warm. Capillary refill takes less than 2 seconds.  Psychiatric: She has a normal mood and affect.     ED Treatments / Results  Labs (all labs ordered are listed, but only abnormal results are displayed) Labs Reviewed - No data to display  EKG  EKG Interpretation None       Radiology Dg Chest 2 View  Result Date: 10/11/2016 CLINICAL DATA:  Cough, congestion and shortness of breath for 1 month. EXAM: CHEST  2 VIEW COMPARISON:  Chest CT 12/03/2014 FINDINGS: The heart is borderline enlarged. The mediastinal and hilar contours are within normal limits. There are chronic appearing bronchitic type lung changes which could be related to smoking. Streaky areas of atelectasis but no infiltrates or effusions. The bony thorax is intact. IMPRESSION: Chronic appearing bronchitic type lung changes. Could not exclude superimposed bronchitis. No definite infiltrates or effusions. Borderline cardiac enlargement. Electronically Signed   By: Rudie Meyer M.D.   On: 10/11/2016 10:01    Procedures Procedures (including critical care time)  Medications Ordered in ED Medications - No data to display   Initial Impression / Assessment and Plan / ED Course  I have reviewed  the triage vital signs and the nursing notes.  Pertinent labs & imaging results that were available during my care of the patient were reviewed by me and considered in my medical decision making (see chart for details).     Patient with cough. Sputum has improved her fevers was stopped. X-ray shows bronchitis. Former smoker. Has been on antibiotics through PCP. Will add short course of steroids. Discharge home.   feel it is unlikely pulmonary embolism since she had fevers and sputum production. She has had 3 or 4 negative CT angiography  in the past for similar symptoms. Final Clinical Impressions(s) / ED Diagnoses   Final diagnoses:  Bronchitis    New Prescriptions New Prescriptions   GUAIFENESIN-DEXTROMETHORPHAN (ROBITUSSIN DM) 100-10 MG/5ML SYRUP    Take 5 mLs by mouth 3 (three) times daily as needed for cough.   PREDNISONE (DELTASONE) 20 MG TABLET    Take 2 tablets (40 mg total) by mouth daily.     Rubin Payor,  Harrold Donath, MD 10/11/16 1032

## 2016-10-11 NOTE — ED Notes (Signed)
Discharge instructions, follow up care, and rx x2 reviewed with patient. Patient verbalized understanding. 

## 2016-10-11 NOTE — ED Triage Notes (Signed)
Patient reports non-productive cough and sore throat x1 month. Patient has been seen a few times for these symptoms. However, patient is not having relief with the medications prescribed.

## 2016-10-17 ENCOUNTER — Encounter (HOSPITAL_COMMUNITY): Payer: Self-pay | Admitting: Emergency Medicine

## 2016-10-17 ENCOUNTER — Observation Stay (HOSPITAL_COMMUNITY)
Admission: EM | Admit: 2016-10-17 | Discharge: 2016-10-18 | Disposition: A | Discharge status: Home / Self Care | Payer: 59 | Attending: Internal Medicine | Admitting: Internal Medicine

## 2016-10-17 ENCOUNTER — Emergency Department (HOSPITAL_COMMUNITY): Payer: 59

## 2016-10-17 DIAGNOSIS — F329 Major depressive disorder, single episode, unspecified: Secondary | ICD-10-CM | POA: Diagnosis not present

## 2016-10-17 DIAGNOSIS — Z87891 Personal history of nicotine dependence: Secondary | ICD-10-CM | POA: Insufficient documentation

## 2016-10-17 DIAGNOSIS — Z6841 Body Mass Index (BMI) 40.0 and over, adult: Secondary | ICD-10-CM | POA: Diagnosis not present

## 2016-10-17 DIAGNOSIS — I4581 Long QT syndrome: Secondary | ICD-10-CM | POA: Insufficient documentation

## 2016-10-17 DIAGNOSIS — R079 Chest pain, unspecified: Secondary | ICD-10-CM | POA: Diagnosis present

## 2016-10-17 DIAGNOSIS — Z7951 Long term (current) use of inhaled steroids: Secondary | ICD-10-CM | POA: Diagnosis not present

## 2016-10-17 DIAGNOSIS — I10 Essential (primary) hypertension: Secondary | ICD-10-CM | POA: Insufficient documentation

## 2016-10-17 DIAGNOSIS — D72829 Elevated white blood cell count, unspecified: Secondary | ICD-10-CM | POA: Diagnosis present

## 2016-10-17 DIAGNOSIS — Z79899 Other long term (current) drug therapy: Secondary | ICD-10-CM | POA: Diagnosis not present

## 2016-10-17 DIAGNOSIS — R52 Pain, unspecified: Secondary | ICD-10-CM

## 2016-10-17 DIAGNOSIS — Z7982 Long term (current) use of aspirin: Secondary | ICD-10-CM | POA: Diagnosis not present

## 2016-10-17 LAB — CBC
HCT: 44.7 % (ref 36.0–46.0)
Hemoglobin: 14.5 g/dL (ref 12.0–15.0)
MCH: 27.2 pg (ref 26.0–34.0)
MCHC: 32.4 g/dL (ref 30.0–36.0)
MCV: 83.7 fL (ref 78.0–100.0)
Platelets: 382 10*3/uL (ref 150–400)
RBC: 5.34 MIL/uL — ABNORMAL HIGH (ref 3.87–5.11)
RDW: 14.8 % (ref 11.5–15.5)
WBC: 17.9 10*3/uL — ABNORMAL HIGH (ref 4.0–10.5)

## 2016-10-17 LAB — BASIC METABOLIC PANEL
Anion gap: 9 (ref 5–15)
BUN: 10 mg/dL (ref 6–20)
CO2: 24 mmol/L (ref 22–32)
Calcium: 9.3 mg/dL (ref 8.9–10.3)
Chloride: 107 mmol/L (ref 101–111)
Creatinine, Ser: 0.91 mg/dL (ref 0.44–1.00)
GFR calc Af Amer: >60 mL/min (ref >60)
GFR calc non Af Amer: >60 mL/min (ref >60)
Glucose, Bld: 100 mg/dL — ABNORMAL HIGH (ref 65–99)
Potassium: 3.3 mmol/L — ABNORMAL LOW (ref 3.5–5.1)
Sodium: 140 mmol/L (ref 135–145)

## 2016-10-17 LAB — I-STAT TROPONIN, ED: Troponin i, poc: 0 ng/mL (ref 0.00–0.08)

## 2016-10-17 NOTE — ED Triage Notes (Signed)
Patient here from home with complaints of chest pain radiating down left arm. Also complains of left sided headache. Reports just getting over bronchitis.

## 2016-10-17 NOTE — ED Provider Notes (Signed)
[facility]-EMERGENCY DEPT Provider Note   CSN: [id number] Arrival date & time: 10/17/16  1628  By signing my name below, I, Kelsey Morgan, attest that this documentation has been prepared under the direction and in the presence of physician practitioner, Raeford RazorKohut, Maikel Neisler, MD. Electronically Signed: Linna Darnerussell Morgan, Scribe. 10/17/2016. 10:18 PM.  History   Chief Complaint Chief Complaint  Patient presents with  . Chest Pain  . Arm Pain   The history is provided by the patient. No language interpreter was used.    HPI Comments: Kelsey Morgan is a 46 y.o. female who presents to the Emergency Department complaining of constant, waxing and waning, left upper chest pain radiating down the left upper extremity beginning about 9 hours ago. She describes her chest pain as a sensation of "tightness" that also radiates into her back. Patient reports some associated tingling in her left forearm, warmth to her left cheek area, and a pressure-like left temporal headache. No modifying factors noted. She has no h/o similar chest pain. Patient was diagnosed with bronchitis on 5/11 and notes her symptoms, including dyspnea and coughing, are improving. She notes some mild, persistent, unchanged left calf pain radiating proximally with associated swelling for 3-4 weeks. 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 She uses Zoloft and a baby aspirin daily. No other regular medications. She is a regular smoker. Patient denies focal weakness, nausea, vomiting, diaphoresis, or any other associated symptoms.  Past Medical History:  Diagnosis Date  . Back pain   . Bronchitis   . Pneumonia     There are no active problems to display for this patient.   Past Surgical History:  Procedure Laterality Date  . ABDOMINAL HYSTERECTOMY    . ANKLE SURGERY    . CHOLECYSTECTOMY    . KNEE SURGERY      OB History    No data available       Home Medications     Prior to Admission medications   Medication Sig Start Date End Date Taking? Authorizing Provider  albuterol (PROVENTIL HFA;VENTOLIN HFA) 108 (90 BASE) MCG/ACT inhaler Inhale 1-2 puffs into the lungs every 6 (six) hours as needed for wheezing or shortness of breath. Patient taking differently: Inhale 2 puffs into the lungs every 4 (four) hours as needed for wheezing or shortness of breath.  12/03/14   Arby BarrettePfeiffer, Marcy, MD  albuterol (PROVENTIL) (2.5 MG/3ML) 0.083% nebulizer solution Take 2.5 mg by nebulization every 6 (six) hours as needed for wheezing or shortness of breath.    [provider]  aspirin 81 MG chewable tablet Chew 81 mg by mouth daily.    [provider]  aspirin-acetaminophen-caffeine (EXCEDRIN EXTRA STRENGTH) [id number] MG tablet Take 2 tablets by mouth every 6 (six) hours as needed for headache or migraine (and pain).    [provider]  benzonatate (TESSALON) 100 MG capsule Take 1-2 capsules by mouth every 8 (eight) hours as needed for cough. 09/25/16   [provider]  cefUROXime (CEFTIN) 500 MG tablet Take 500 mg by mouth 2 (two) times daily. Started 05/04 for 10 days    [provider]  dextromethorphan (DELSYM) 30 MG/5ML liquid Take 60 mg by mouth every 12 (twelve) hours as needed for cough.    [provider]  fluticasone (FLONASE) 50 MCG/ACT nasal spray Place 2 sprays into both nostrils daily.    [provider]  guaiFENesin-dextromethorphan (ROBITUSSIN DM) 100-10 MG/5ML syrup Take 5 mLs  by mouth 3 (three) times daily as needed for cough. 10/11/16   Benjiman Core, MD  Multiple Vitamin (MULTIVITAMIN WITH MINERALS) TABS tablet Take 1 tablet by mouth daily.    [provider]  predniSONE (DELTASONE) 20 MG tablet Take 2 tablets (40 mg total) by mouth daily. 10/11/16   Benjiman Core, MD  sertraline (ZOLOFT) 100 MG tablet Take 100 mg by mouth at bedtime.     [provider]  tiZANidine (ZANAFLEX)  4 MG tablet Take 8 mg by mouth daily.  12/01/15   [provider]    Family History No family history on file.  Social History Social History  Substance Use Topics  . Smoking status: Current Every Day Smoker    Packs/day: 0.50    Years: 25.00    Types: Cigarettes  . Smokeless tobacco: Never Used  . Alcohol use No     Allergies   Percocet [oxycodone-acetaminophen] and Vicodin [hydrocodone-acetaminophen]   Review of Systems Review of Systems  Constitutional: Negative for diaphoresis.  Respiratory: Positive for cough and shortness of breath.   Cardiovascular: Positive for chest pain and leg swelling.  Gastrointestinal: Negative for nausea and vomiting.  Musculoskeletal: Positive for back pain (secondary to radiation) and myalgias.  Neurological: Positive for numbness (tingling) and headaches. Negative for weakness.   Physical Exam Updated Vital Signs BP 140/82 (BP Location: Right Arm)   Pulse 78   Temp 98.1 F (36.7 C) (Oral)   Resp 16   SpO2 96%   Physical Exam  Constitutional: She appears well-developed and well-nourished.  HENT:  Head: Normocephalic.  Right Ear: External ear normal.  Left Ear: External ear normal.  Nose: Nose normal.  Mouth/Throat: Oropharynx is clear and moist.  Eyes: Conjunctivae are normal. Right eye exhibits no discharge. Left eye exhibits no discharge.  Neck: Normal range of motion.  Cardiovascular: Normal rate, regular rhythm and normal heart sounds.   No murmur heard. Pulmonary/Chest: Effort normal and breath sounds normal. No respiratory distress. She has no wheezes. She has no rales.  Abdominal: Soft. She exhibits no distension. There is no tenderness. There is no rebound and no guarding.  Musculoskeletal: Normal range of motion. She exhibits tenderness. She exhibits no edema.  Left calf tenderness. Left leg appears similar in size to the right.  Neurological: She is alert. No cranial nerve deficit. Coordination normal.  Skin:  Skin is warm and dry. No rash noted. No erythema. No pallor.  Psychiatric: She has a normal mood and affect. Her behavior is normal.  Nursing note and vitals reviewed.  ED Treatments / Results  Labs (all labs ordered are listed, but only abnormal results are displayed) Labs Reviewed  BASIC METABOLIC PANEL - Abnormal; Notable for the following:       Result Value   Potassium 3.3 (*)    Glucose, Bld 100 (*)    All other components within normal limits  CBC - Abnormal; Notable for the following:    WBC 17.9 (*)    RBC 5.34 (*)    All other components within normal limits  TROPONIN I  D-DIMER, QUANTITATIVE (NOT AT [facility])  I-STAT TROPOININ, ED    EKG  EKG Interpretation  Date/Time:  Thursday Oct 17 2016 16:37:58 EDT Ventricular Rate:  90 PR Interval:    QRS Duration: 79 QT Interval:  362 QTC Calculation: 443 R Axis:   71 Text Interpretation:  Sinus  rhythm Short PR interval Anterior infarct, old Baseline wander in lead(s) V1 Confirmed by Juleen China  MD, Jeannett Senior [phone]) on 10/17/2016 10:14:32 PM       Radiology Dg Chest 2 View  Result Date: 10/17/2016 CLINICAL DATA:  Chest and left arm tightness and hot feeling in the left aspect of the face for the past 3 hours. Current smoker. EXAM: CHEST  2 VIEW COMPARISON:  Chest x-ray of Oct 11, 2016 FINDINGS: The lungs are adequately inflated. The interstitial markings remain increased diffusely. The pulmonary vascularity is less prominent today. The cardiac silhouette is normal in size. The mediastinum is normal in width. The bony thorax is unremarkable. IMPRESSION: Chronic interstitial prominence may reflect chronic bronchitis and the patient's smoking history. No pneumonia nor pulmonary edema. Electronically Signed   By: David  [place] M.D.   On: 10/17/2016 17:13    Procedures Procedures (including critical care time)  DIAGNOSTIC STUDIES: Oxygen Saturation is 96% on RA, adequate by my interpretation.    COORDINATION OF CARE: 10:17 PM  Discussed treatment plan with pt at bedside and pt agreed to plan.  Medications Ordered in ED Medications - No data to display   Initial Impression / Assessment and Plan / ED Course  I have reviewed the triage vital signs and the nursing notes.  Pertinent labs & imaging results that were available during my care of the patient were reviewed by me and considered in my medical decision making (see chart for details).     45yF with chest pain. Seem atypical for ACS. EKG abnormal, but not acutely changed from prior. Initial troponin is normal. Will repeat. Also some L leg pain and she reports swelling although I cannot clearly appreciate this on exam. She is a truck driver. Concern for possible although she has had CT angio several times previous including at least once with LLE pain and all have been negative. Not hypoxic or tachycardic. Will check d-dimer. Will CT if elevated.   Final Clinical Impressions(s) / ED Diagnoses   Final diagnoses:  Chest pain, unspecified type    New Prescriptions New Prescriptions   No medications on file   I personally preformed the services scribed in my presence. The recorded information has been reviewed is accurate. Raeford Razor, MD.    Raeford Razor, MD 10/27/16 1052

## 2016-10-18 ENCOUNTER — Other Ambulatory Visit: Payer: Self-pay | Admitting: Cardiology

## 2016-10-18 ENCOUNTER — Observation Stay (HOSPITAL_BASED_OUTPATIENT_CLINIC_OR_DEPARTMENT_OTHER): Payer: 59

## 2016-10-18 ENCOUNTER — Encounter (HOSPITAL_COMMUNITY): Payer: Self-pay

## 2016-10-18 ENCOUNTER — Emergency Department (HOSPITAL_COMMUNITY): Payer: 59

## 2016-10-18 DIAGNOSIS — D72829 Elevated white blood cell count, unspecified: Secondary | ICD-10-CM | POA: Diagnosis present

## 2016-10-18 DIAGNOSIS — R072 Precordial pain: Secondary | ICD-10-CM

## 2016-10-18 DIAGNOSIS — R0789 Other chest pain: Secondary | ICD-10-CM

## 2016-10-18 DIAGNOSIS — I5030 Unspecified diastolic (congestive) heart failure: Secondary | ICD-10-CM

## 2016-10-18 DIAGNOSIS — R079 Chest pain, unspecified: Secondary | ICD-10-CM | POA: Diagnosis not present

## 2016-10-18 LAB — CBC WITH DIFFERENTIAL/PLATELET
Basophils Absolute: 0 10*3/uL (ref 0.0–0.1)
Basophils Relative: 0 %
EOS ABS: 0.7 10*3/uL (ref 0.0–0.7)
Eosinophils Relative: 4 %
HCT: 39.9 % (ref 36.0–46.0)
HEMOGLOBIN: 12.9 g/dL (ref 12.0–15.0)
LYMPHS ABS: 4.5 10*3/uL — AB (ref 0.7–4.0)
Lymphocytes Relative: 28 %
MCH: 27 pg (ref 26.0–34.0)
MCHC: 32.3 g/dL (ref 30.0–36.0)
MCV: 83.6 fL (ref 78.0–100.0)
Monocytes Absolute: 0.9 10*3/uL (ref 0.1–1.0)
Monocytes Relative: 5 %
NEUTROS ABS: 9.8 10*3/uL — AB (ref 1.7–7.7)
NEUTROS PCT: 63 %
Platelets: 361 10*3/uL (ref 150–400)
RBC: 4.77 MIL/uL (ref 3.87–5.11)
RDW: 14.6 % (ref 11.5–15.5)
WBC: 15.9 10*3/uL — ABNORMAL HIGH (ref 4.0–10.5)

## 2016-10-18 LAB — COMPREHENSIVE METABOLIC PANEL
ALT: 21 U/L (ref 14–54)
AST: 20 U/L (ref 15–41)
Albumin: 3.4 g/dL — ABNORMAL LOW (ref 3.5–5.0)
Alkaline Phosphatase: 65 U/L (ref 38–126)
Anion gap: 10 (ref 5–15)
BUN: 10 mg/dL (ref 6–20)
CHLORIDE: 107 mmol/L (ref 101–111)
CO2: 22 mmol/L (ref 22–32)
CREATININE: 0.88 mg/dL (ref 0.44–1.00)
Calcium: 8.4 mg/dL — ABNORMAL LOW (ref 8.9–10.3)
Glucose, Bld: 112 mg/dL — ABNORMAL HIGH (ref 65–99)
POTASSIUM: 3.4 mmol/L — AB (ref 3.5–5.1)
SODIUM: 139 mmol/L (ref 135–145)
Total Bilirubin: 0.6 mg/dL (ref 0.3–1.2)
Total Protein: 7 g/dL (ref 6.5–8.1)

## 2016-10-18 LAB — HEPARIN LEVEL (UNFRACTIONATED)

## 2016-10-18 LAB — ECHOCARDIOGRAM COMPLETE
HEIGHTINCHES: 66 in
Weight: 4705.6 oz

## 2016-10-18 LAB — APTT: aPTT: 32 seconds (ref 24–36)

## 2016-10-18 LAB — RAPID URINE DRUG SCREEN, HOSP PERFORMED
Amphetamines: NOT DETECTED
BENZODIAZEPINES: NOT DETECTED
Barbiturates: NOT DETECTED
COCAINE: NOT DETECTED
OPIATES: NOT DETECTED
TETRAHYDROCANNABINOL: NOT DETECTED

## 2016-10-18 LAB — TROPONIN I
TROPONIN I: 0.03 ng/mL — AB (ref <0.03)
TROPONIN I: 0.03 ng/mL — AB (ref <0.03)
Troponin I: 0.04 ng/mL (ref <0.03)

## 2016-10-18 LAB — PROTIME-INR
INR: 1.03
Prothrombin Time: 13.5 seconds (ref 11.4–15.2)

## 2016-10-18 LAB — D-DIMER, QUANTITATIVE: D-Dimer, Quant: 2.78 ug/mL-FEU — ABNORMAL HIGH (ref 0.00–0.50)

## 2016-10-18 MED ORDER — ATORVASTATIN CALCIUM 40 MG PO TABS
40.0000 mg | ORAL_TABLET | Freq: Every day | ORAL | Status: DC
  Administered 2016-10-18: 40 mg via ORAL
  Filled 2016-10-18: qty 1

## 2016-10-18 MED ORDER — TIZANIDINE HCL 4 MG PO TABS
8.0000 mg | ORAL_TABLET | Freq: Every day | ORAL | Status: DC
  Administered 2016-10-18: 8 mg via ORAL
  Filled 2016-10-18 (×2): qty 2

## 2016-10-18 MED ORDER — SERTRALINE HCL 100 MG PO TABS: 100.0000 mg | ORAL_TABLET | Freq: Every day | ORAL | Status: DC

## 2016-10-18 MED ORDER — IOPAMIDOL (ISOVUE-370) INJECTION 76%
INTRAVENOUS | Status: AC
  Administered 2016-10-18: 100 mL via INTRAVENOUS
  Filled 2016-10-18: qty 100

## 2016-10-18 MED ORDER — NITROGLYCERIN 0.4 MG SL SUBL
0.4000 mg | SUBLINGUAL_TABLET | SUBLINGUAL | Status: DC | PRN
  Administered 2016-10-18: 0.4 mg via SUBLINGUAL
  Filled 2016-10-18: qty 1

## 2016-10-18 MED ORDER — HEPARIN (PORCINE) IN NACL 100-0.45 UNIT/ML-% IJ SOLN: 1300.0000 [IU]/h | INTRAMUSCULAR | Status: DC

## 2016-10-18 MED ORDER — IOPAMIDOL (ISOVUE-370) INJECTION 76%
100.0000 mL | Freq: Once | INTRAVENOUS | Status: AC | PRN
  Administered 2016-10-18: 100 mL via INTRAVENOUS

## 2016-10-18 MED ORDER — ONDANSETRON HCL 4 MG/2ML IJ SOLN: 4.0000 mg | Freq: Four times a day (QID) | INTRAMUSCULAR | Status: DC | PRN

## 2016-10-18 MED ORDER — NITROGLYCERIN 0.4 MG/HR TD PT24
0.4000 mg | MEDICATED_PATCH | Freq: Every day | TRANSDERMAL | Status: DC
  Administered 2016-10-18: 0.4 mg via TRANSDERMAL
  Filled 2016-10-18 (×2): qty 1

## 2016-10-18 MED ORDER — ACETAMINOPHEN 325 MG PO TABS
650.0000 mg | ORAL_TABLET | Freq: Four times a day (QID) | ORAL | Status: DC | PRN
  Administered 2016-10-18: 650 mg via ORAL
  Filled 2016-10-18: qty 2

## 2016-10-18 MED ORDER — HEPARIN BOLUS VIA INFUSION
2500.0000 [IU] | Freq: Once | INTRAVENOUS | Status: DC
  Filled 2016-10-18: qty 2500

## 2016-10-18 MED ORDER — FLUTICASONE PROPIONATE 50 MCG/ACT NA SUSP
2.0000 | Freq: Every day | NASAL | Status: DC
  Administered 2016-10-18: 2 via NASAL
  Filled 2016-10-18: qty 16

## 2016-10-18 MED ORDER — MORPHINE SULFATE (PF) 4 MG/ML IV SOLN: 2.0000 mg | INTRAVENOUS | Status: DC | PRN

## 2016-10-18 MED ORDER — METOPROLOL TARTRATE 25 MG PO TABS
12.5000 mg | ORAL_TABLET | Freq: Two times a day (BID) | ORAL | Status: DC
  Administered 2016-10-18: 12.5 mg via ORAL
  Filled 2016-10-18: qty 1

## 2016-10-18 MED ORDER — ALBUTEROL SULFATE (2.5 MG/3ML) 0.083% IN NEBU: 2.5000 mg | INHALATION_SOLUTION | Freq: Four times a day (QID) | RESPIRATORY_TRACT | Status: DC | PRN

## 2016-10-18 MED ORDER — ASPIRIN EC 325 MG PO TBEC
325.0000 mg | DELAYED_RELEASE_TABLET | Freq: Every day | ORAL | Status: DC
  Administered 2016-10-18: 325 mg via ORAL
  Filled 2016-10-18: qty 1

## 2016-10-18 MED ORDER — SODIUM CHLORIDE 0.9 % IV BOLUS (SEPSIS)
500.0000 mL | Freq: Once | INTRAVENOUS | Status: AC
  Administered 2016-10-18: 500 mL via INTRAVENOUS

## 2016-10-18 MED ORDER — HEPARIN (PORCINE) IN NACL 100-0.45 UNIT/ML-% IJ SOLN
1000.0000 [IU]/h | INTRAMUSCULAR | Status: DC
  Administered 2016-10-18: 1000 [IU]/h via INTRAVENOUS
  Filled 2016-10-18: qty 250

## 2016-10-18 MED ORDER — ATORVASTATIN CALCIUM 40 MG PO TABS: 40.0000 mg | ORAL_TABLET | Freq: Every day | ORAL | 2 refills | Status: DC

## 2016-10-18 MED ORDER — METOPROLOL TARTRATE 25 MG PO TABS: 12.5000 mg | ORAL_TABLET | Freq: Two times a day (BID) | ORAL | 2 refills | Status: DC

## 2016-10-18 MED ORDER — HEPARIN BOLUS VIA INFUSION
4000.0000 [IU] | Freq: Once | INTRAVENOUS | Status: AC
  Administered 2016-10-18: 4000 [IU] via INTRAVENOUS
  Filled 2016-10-18: qty 4000

## 2016-10-18 NOTE — Consult Note (Addendum)
CARDIOLOGY CONSULT NOTE  Patient ID: Kelsey Morgan MRN: [id number] DOB/AGE: 07/31/1970 46 y.o.  Admit date: 10/17/2016 Primary Physician Deatra JamesSun, Vyvyan, MD Primary Cardiologist None Chief Complaint  Chest and arm pain Requesting  Dr. Nicanor AlconPalumbo  HPI:  Asked by Dr. Nicanor AlconPalumbo to see this patient with chest pain.  She has no past cardiac history.  She reported chest pain starting yesterday AM at about 11.  This was left upper chest.  There was some arm tightness and a warm feeling from her face down her left side with some diaphoresis only on the left.  The discomfort was new for her.  At its peak it was 10/10.  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  However, CT did not suggest pulmonary embolism.  Her EKG demonstrated non specific lateral ST changes.  She did have a troponin of 0.04.  She was treated with IV heparin and reports that she had relief of her pain with SLNTG.  She has an elevated WBC.  She was treated for bronchitis in the ED on 5/11.  She finished a taper of PO steroids on Sunday.  She had been coughing and had sputum that was green and now is yellow.    Of not she has had no past cardiac testing.  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  She unloads the truck and has had no symptoms with this recently.     Past Medical History:  Diagnosis Date  . Back pain   . Bronchitis   . Pneumonia     Past Surgical History:  Procedure Laterality Date  . ABDOMINAL HYSTERECTOMY    . ANKLE SURGERY    . CHOLECYSTECTOMY    . KNEE SURGERY      Allergies  Allergen Reactions  . Percocet [Oxycodone-Acetaminophen] Itching  . Vicodin [Hydrocodone-Acetaminophen] Itching   No prescriptions prior to admission.   Family History  Problem Relation Age of Onset  . Hypertension Mother   . Kidney failure Mother     Social History   Social History  . Marital status: Single    Spouse name: N/A  . Number of children: N/A  . Years of education: N/A   Occupational History    . Truck driver    Social History Main Topics  . Smoking status: Former Smoker    Packs/day: 0.50    Years: 25.00    Types: Cigarettes  . Smokeless tobacco: Never Used     Comment: Quit 5 months ago.   . Alcohol use No  . Drug use: No  . Sexual activity: Not on file   Other Topics Concern  . Not on file   Social History Narrative   Lives with partner.       ROS:    As stated in the HPI and negative for all other systems.  Physical Exam: Blood pressure (!) 167/97, pulse 73, temperature 98.3 F (36.8 C), temperature source Oral, resp. rate 18, height 5\' 6"  (1.676 m), weight 294 lb 1.6 oz (133.4 kg), SpO2 96 %.   Body mass index is 47.47 kg/m. GENERAL:  Well appearing HEENT:  Pupils equal round and reactive, fundi not visualized, oral mucosa unremarkable NECK:  No jugular venous distention, waveform within normal limits, carotid upstroke brisk and symmetric, no bruits, no thyromegaly LYMPHATICS:  No cervical, inguinal adenopathy LUNGS:  Clear to auscultation bilaterally BACK:  No CVA tenderness CHEST:  Unremarkable HEART:  PMI not displaced or sustained,S1 and S2  within normal limits, no S3, no S4, no clicks, no rubs, no murmurs ABD:  Flat, positive bowel sounds normal in frequency in pitch, no bruits, no rebound, no guarding, no midline pulsatile mass, no hepatomegaly, no splenomegaly EXT:  2 plus pulses throughout, left greater than right edema, no cyanosis no clubbing, left leg larger than right, skin graft on the left foot from previous trauma.  SKIN:  No rashes no nodules NEURO:  Cranial nerves II through XII grossly intact, motor grossly intact throughout PSYCH:  Cognitively intact, oriented to person place and time   Labs: Lab Results  Component Value Date   BUN 10 10/18/2016   Lab Results  Component Value Date   CREATININE 0.88 10/18/2016   Lab Results  Component Value Date   NA 139 10/18/2016   K 3.4 (L) 10/18/2016   CL 107 10/18/2016   CO2 22 10/18/2016    Lab Results  Component Value Date   TROPONINI 0.03 (HH) 10/18/2016   Lab Results  Component Value Date   WBC 15.9 (H) 10/18/2016   HGB 12.9 10/18/2016   HCT 39.9 10/18/2016   MCV 83.6 10/18/2016   PLT 361 10/18/2016   No results found for: CHOL, HDL, LDLCALC, LDLDIRECT, TRIG, CHOLHDL Lab Results  Component Value Date   ALT 21 10/18/2016   AST 20 10/18/2016   ALKPHOS 65 10/18/2016   BILITOT 0.6 10/18/2016      Radiology:   CT CHEST:  IMPRESSION: 1. No acute pulmonary embolus, aortic aneurysm or dissection. 2. Dependent atelectasis within the lungs without pneumonic consolidation or pneumothorax.  EKG:   NSR, rate 76, axis WNL, QTc prolonged, nonspecific ST T wave changes.    ASSESSMENT AND PLAN:   CHEST PAIN:   This has atypical greater than typical features.  She should continue to have enzymes and be observed.  Later if follow up enzymes are negative and there are no further changes or repeat EKG and if she has no further chest pain off of heparin, she would be low risk according to the HEART Score would be low risk (3) and could have outpatient stress testing.    PROLONGED QT:    I discussed this with the patient.  Follow up EKG before discharge.  Educate about avoiding QT prolonging medications.  MORBID OBESITY:  Needs weight loss with diet and exercise.   SignedRollene Rotunda 10/27/2016, 1:37 PM

## 2016-10-18 NOTE — Progress Notes (Signed)
Pt discharged to home, instructions given acknowledge understanding. SRP, RN

## 2016-10-18 NOTE — Progress Notes (Signed)
Ms. Kelsey Morgan is a [age]F former smoker with morbid obesity here with chest pain in the setting of recently being treated for bronchitis.  She is currently chest pain free.  Troponin went from 0.04 to 0.03.  EKG shows poor R wave progression and anterior T wave flattening.  D-dimer was elevated but CT-A of the chest was negative for PE.  If her next cardiac enzyme at noon is flat or downtrending, will plan for outpatient stress testing.  If it is rising she will need LHC.  One BP was elevated this am.  This is likely because she has a headache on nitroglycerin.  Will continue to monitor for now.  Discontinue nitro patch.   [name], MD, [facility] HospitalFACC 10/18/2016 9:18 AM

## 2016-10-18 NOTE — ED Provider Notes (Signed)
320 case d/w Dr. Antoine PocheHochrein, triad to consult cardiology place on heparin and admit to medicine   Kelsey Searing, MD 10/18/16 [phone]

## 2016-10-18 NOTE — Progress Notes (Signed)
ANTICOAGULATION CONSULT NOTE - Follow Up Consult  Pharmacy Consult for heparin Indication: chest pain/ACS  Allergies  Allergen Reactions  . Percocet [Oxycodone-Acetaminophen] Itching  . Vicodin [Hydrocodone-Acetaminophen] Itching    Patient Measurements: Height: 5\' 6"  (167.6 cm) Weight: 294 lb 1.6 oz (133.4 kg) IBW/kg (Calculated) : 59.3 Heparin Dosing Weight: 92 kg  Vital Signs: Temp: 98.3 F (36.8 C) (05/18 0448) Temp Source: Oral (05/18 0448) BP: 167/97 (05/18 0448) Pulse Rate: 73 (05/18 0448)  Labs:  Recent Labs  10/17/16 1717 10/18/16 0006 10/18/16 0023 10/18/16 0610 10/18/16 0930 10/18/16 0932  HGB 14.5  --   --  12.9  --   --   HCT 44.7  --   --  39.9  --   --   PLT 382  --   --  361  --   --   APTT  --   --  32  --   --   --   LABPROT  --   --  13.5  --   --   --   INR  --   --  1.03  --   --   --   HEPARINUNFRC  --   --   --   --   --  <0.10*  CREATININE 0.91  --   --  0.88  --   --   TROPONINI  --  0.04*  --  0.03* 0.03*  --     Estimated Creatinine Clearance: 110.9 mL/min (by C-G formula based on SCr of 0.88 mg/dL).   Assessment: Patient is a 46 y.o F presented to the ED on 10/17/16 with c/o CP.  Heparin drip started for r/o ACS.  Today, 10/18/2016: - first heparin level now back undetectable - cbc stable - no bleeding documented - Troponin down slightly to 0.03 - cardiologist indicates that plan will be outpt stress test if cardiac enzymes continue to be stable or trend down    Goal of Therapy:  Heparin level 0.3-0.7 units/ml Monitor platelets by anticoagulation protocol: Yes   Plan:  -  Heparin 2500 units IV bolus x1, then increase drip to 1300 units/hr - check 6 hr heparin level - monitor for s/s bleeding - f/u cardiac enzymes   Gearald Stonebraker P 10/18/2016,11:48 AM

## 2016-10-18 NOTE — Discharge Summary (Signed)
Physician Discharge Summary  Kelsey Morgan XBM:[id number] DOB: [birth year] DOA: 10/17/2016  PCP: Kelsey James, MD  Admit date: 10/17/2016 Discharge date: 10/18/2016  Time spent: 45 minutes  Recommendations for Outpatient Follow-up:  -Will be discharged home today. -Cardiology has arranged for stress test and for follow up.   Discharge Diagnoses:  Principal Problem:   Chest pain Active Problems:   Leucocytosis   Discharge Condition: Stable and improved  Filed Weights   10/18/16 0448  Weight: 133.4 kg (294 lb 1.6 oz)    History of present illness:  As per Dr. Toniann Fail on 5/18: Kelsey Morgan is a 46 y.o. female with history of depression, bronchitis previous history of cigarette smoking quit 5 months ago presents to the ER because of chest pain. Patient states patient has been having chest pain off and on last few days. Patient states she was treated for bronchitis last week and had just finished her course of prednisone and antibiotics. Patient complains of retrosternal chest pain pressure like radiating to left arm squeezing in nature. Pain is present even at rest.   ED Course: In the ER troponin was mildly elevated with an EKG showing nonspecific findings. CT angiogram of the chest was negative for PE. ER physician had discussed with Dr. Antoine Poche on-call cardiologist who recommended patient to be placed on heparin which was started.  Hospital Course:   Chest Pain -Has ruled out for ACS by troponins and EKG. -CT angio chest was negative for PE. -Seen by cardiology with recommendations to DC home and follow up with OP stress testing which has already been arranged.  Tobacco Abuse -Counseled on cessation.  HTN -Fair control, continue home meds.  Procedures:  None   Consultations:  Cardiology, Dr. Duke Salvia  Discharge Instructions  Discharge Instructions    Diet - low sodium heart healthy    Complete by:  As directed    Increase activity slowly     Complete by:  As directed      Allergies as of 10/18/2016      Reactions   Percocet [oxycodone-acetaminophen] Itching   Vicodin [hydrocodone-acetaminophen] Itching      Medication List    STOP taking these medications   guaiFENesin-dextromethorphan 100-10 MG/5ML syrup Commonly known as:  ROBITUSSIN DM     TAKE these medications   albuterol (2.5 MG/3ML) 0.083% nebulizer solution Commonly known as:  PROVENTIL Take 2.5 mg by nebulization every 6 (six) hours as needed for wheezing or shortness of breath. What changed:  Another medication with the same name was changed. Make sure you understand how and when to take each.   albuterol 108 (90 Base) MCG/ACT inhaler Commonly known as:  PROVENTIL HFA;VENTOLIN HFA Inhale 1-2 puffs into the lungs every 6 (six) hours as needed for wheezing or shortness of breath. What changed:  how much to take  when to take this   aspirin 81 MG chewable tablet Chew 81 mg by mouth daily.   atorvastatin 40 MG tablet Commonly known as:  LIPITOR Take 1 tablet (40 mg total) by mouth daily at 6 PM. Start taking on:  10/19/2016   EXCEDRIN EXTRA STRENGTH 027-253-66 MG tablet Generic drug:  aspirin-acetaminophen-caffeine Take 2 tablets by mouth every 6 (six) hours as needed for headache or migraine (and pain).   fluticasone 50 MCG/ACT nasal spray Commonly known as:  FLONASE Place 2 sprays into both nostrils daily.   guaiFENesin 600 MG 12 hr tablet Commonly known as:  MUCINEX Take 1,200 mg by mouth  2 (two) times daily.   metoprolol tartrate 25 MG tablet Commonly known as:  LOPRESSOR Take 0.5 tablets (12.5 mg total) by mouth 2 (two) times daily.   multivitamin with minerals Tabs tablet Take 1 tablet by mouth daily.   sertraline 100 MG tablet Commonly known as:  ZOLOFT Take 100 mg by mouth at bedtime.   tiZANidine 4 MG tablet Commonly known as:  ZANAFLEX Take 8 mg by mouth daily.      Allergies  Allergen Reactions  . Percocet  [Oxycodone-Acetaminophen] Itching  . Vicodin [Hydrocodone-Acetaminophen] Itching   Follow-up Information    Kelsey James, MD Follow up.   Specialty:  Family Medicine Contact information: [phone] [address] [place] [address] [phone]        Rollene Rotunda, MD Follow up on 11/05/2016.   Specialty:  Cardiology Why:  at 2:00PM with Corine Shelter his PA Contact information: [address] [place] [address] [phone]            The results of significant diagnostics from this hospitalization (including imaging, microbiology, ancillary and laboratory) are listed below for reference.    Significant Diagnostic Studies: Dg Chest 2 View  Result Date: 10/17/2016 CLINICAL DATA:  Chest and left arm tightness and hot feeling in the left aspect of the face for the past 3 hours. Current smoker. EXAM: CHEST  2 VIEW COMPARISON:  Chest x-ray of Oct 11, 2016 FINDINGS: The lungs are adequately inflated. The interstitial markings remain increased diffusely. The pulmonary vascularity is less prominent today. The cardiac silhouette is normal in size. The mediastinum is normal in width. The bony thorax is unremarkable. IMPRESSION: Chronic interstitial prominence may reflect chronic bronchitis and the patient's smoking history. No pneumonia nor pulmonary edema. Electronically Signed   By: David  [place]and M.D.   On: 10/17/2016 17:13   Dg Chest 2 View  Result Date: 10/11/2016 CLINICAL DATA:  Cough, congestion and shortness of breath for 1 month. EXAM: CHEST  2 VIEW COMPARISON:  Chest CT 12/03/2014 FINDINGS: The heart is borderline enlarged. The mediastinal and hilar contours are within normal limits. There are chronic appearing bronchitic type lung changes which could be related to smoking. Streaky areas of atelectasis but no infiltrates or effusions. The bony thorax is intact. IMPRESSION: Chronic appearing bronchitic type lung changes. Could not exclude superimposed  bronchitis. No definite infiltrates or effusions. Borderline cardiac enlargement. Electronically Signed   By: Rudie Meyer M.D.   On: 10/11/2016 10:01   Ct Angio Chest Pe W Or Wo Contrast  Result Date: 10/18/2016 CLINICAL DATA:  Left upper chest pain times several hours radiating to the back with dyspnea and cough. EXAM: CT ANGIOGRAPHY CHEST WITH CONTRAST TECHNIQUE: Multidetector CT imaging of the chest was performed using the standard protocol during bolus administration of intravenous contrast. Multiplanar CT image reconstructions and MIPs were obtained to evaluate the vascular anatomy. CONTRAST:  100 cc Isovue 370 IV COMPARISON:  None. FINDINGS: Cardiovascular: No acute pulmonary embolus to the third order branches. No aortic aneurysm or dissection. Normal size cardiac chambers. No pericardial effusion. Mediastinum/Nodes: Mildly enlarged hilar lymph nodes may be reactive in etiology. No mediastinal lymphadenopathy. Normal appearing thyroid gland esophagus. The trachea and mainstem bronchi are patent. Lungs/Pleura: There is no pneumonic consolidation, pneumothorax or effusion. Mild emphysematous change in the upper lobes and left lung base. Bibasilar dependent atelectasis. Upper Abdomen: No acute abnormality Musculoskeletal: No chest wall abnormality. No acute or significant osseous findings. Review of the MIP images confirms  the above findings. IMPRESSION: 1. No acute pulmonary embolus, aortic aneurysm or dissection. 2. Dependent atelectasis within the lungs without pneumonic consolidation or pneumothorax. Electronically Signed   By: Tollie Ethavid  Kwon M.D.   On: 10/18/2016 02:58    Microbiology: No results found for this or any previous visit (from the past 240 hour(s)).   Labs: Basic Metabolic Panel:  Recent Labs Lab 10/17/16 1717 10/18/16 0610  NA 140 139  K 3.3* 3.4*  CL 107 107  CO2 24 22  GLUCOSE 100* 112*  BUN 10 10  CREATININE 0.91 0.88  CALCIUM 9.3 8.4*   Liver Function  Tests:  Recent Labs Lab 10/18/16 0610  AST 20  ALT 21  ALKPHOS 65  BILITOT 0.6  PROT 7.0  ALBUMIN 3.4*   No results for input(s): LIPASE, AMYLASE in the last 168 hours. No results for input(s): AMMONIA in the last 168 hours. CBC:  Recent Labs Lab 10/17/16 1717 10/18/16 0610  WBC 17.9* 15.9*  NEUTROABS  --  9.8*  HGB 14.5 12.9  HCT 44.7 39.9  MCV 83.7 83.6  PLT 382 361   Cardiac Enzymes:  Recent Labs Lab 10/18/16 0006 10/18/16 0610 10/18/16 0930  TROPONINI 0.04* 0.03* 0.03*   BNP: BNP (last 3 results) No results for input(s): BNP in the last 8760 hours.  ProBNP (last 3 results) No results for input(s): PROBNP in the last 8760 hours.  CBG: No results for input(s): GLUCAP in the last 168 hours.     SignedChaya Jan:  HERNANDEZ ACOSTA,Evyn Kooyman  [facility] Pager: [phone] 10/18/2016, 2:29 PM

## 2016-10-18 NOTE — Discharge Instructions (Signed)
You are scheduled for a exercise stress test on 10/30/16 at 8:00AM nothing to eat or drink after midnight the night before.  The second pard of this test is 10/31/16 at 8:00 AM nothing to eat or drink after midnight the night before .  You will then have a follow up June 5th  [address] stress test [address] [place] - on second floor.

## 2016-10-18 NOTE — H&P (Signed)
History and Physical    Kelsey Morgan UJW:[id number] DOB: 1970/09/11 DOA: 10/17/2016  PCP: Deatra JamesSun, Vyvyan, MD  Patient coming from: Home.  Chief Complaint: Chest pain.  HPI: Kelsey Morgan is a 46 y.o. female with history of depression, bronchitis previous history of cigarette smoking quit 5 months ago presents to the ER because of chest pain. Patient states patient has been having chest pain off and on last few days. Patient states she was treated for bronchitis last week and had just finished her course of prednisone and antibiotics. Patient complains of retrosternal chest pain pressure like radiating to left arm squeezing in nature. Pain is present even at rest.   ED Course: In the ER troponin was mildly elevated with an EKG showing nonspecific findings. CT angiogram of the chest was negative for PE. ER physician had discussed with Dr. Antoine PocheHochrein on-call cardiologist who recommended patient to be placed on heparin which was started.  Review of Systems: As per HPI, rest all negative.   Past Medical History:  Diagnosis Date  . Back pain   . Bronchitis   . Pneumonia     Past Surgical History:  Procedure Laterality Date  . ABDOMINAL HYSTERECTOMY    . ANKLE SURGERY    . CHOLECYSTECTOMY    . KNEE SURGERY       reports that she has been smoking Cigarettes.  She has a 12.50 pack-year smoking history. She has never used smokeless tobacco. She reports that she does not drink alcohol or use drugs.  Allergies  Allergen Reactions  . Percocet [Oxycodone-Acetaminophen] Itching  . Vicodin [Hydrocodone-Acetaminophen] Itching    Family History  Problem Relation Age of Onset  . Hypertension Mother     Prior to Admission medications   Medication Sig Start Date End Date Taking? Authorizing Provider  albuterol (PROVENTIL HFA;VENTOLIN HFA) 108 (90 BASE) MCG/ACT inhaler Inhale 1-2 puffs into the lungs every 6 (six) hours as needed for wheezing or shortness of breath. Patient taking  differently: Inhale 2 puffs into the lungs every 4 (four) hours as needed for wheezing or shortness of breath.  12/03/14  Yes Arby BarrettePfeiffer, Marcy, MD  albuterol (PROVENTIL) (2.5 MG/3ML) 0.083% nebulizer solution Take 2.5 mg by nebulization every 6 (six) hours as needed for wheezing or shortness of breath.   Yes [provider]  aspirin 81 MG chewable tablet Chew 81 mg by mouth daily.   Yes [provider]  aspirin-acetaminophen-caffeine (EXCEDRIN EXTRA STRENGTH) [phone] MG tablet Take 2 tablets by mouth every 6 (six) hours as needed for headache or migraine (and pain).   Yes [provider]  fluticasone (FLONASE) 50 MCG/ACT nasal spray Place 2 sprays into both nostrils daily.   Yes [provider]  guaiFENesin (MUCINEX) 600 MG 12 hr tablet Take 1,200 mg by mouth 2 (two) times daily.   Yes [provider]  guaiFENesin-dextromethorphan (ROBITUSSIN DM) 100-10 MG/5ML syrup Take 5 mLs by mouth 3 (three) times daily as needed for cough. 10/11/16  Yes Benjiman CorePickering, Nathan, MD  Multiple Vitamin (MULTIVITAMIN WITH MINERALS) TABS tablet Take 1 tablet by mouth daily.   Yes [provider]  sertraline (ZOLOFT) 100 MG tablet Take 100 mg by mouth at bedtime.    Yes [provider]  tiZANidine (ZANAFLEX) 4 MG tablet Take 8 mg by mouth daily.  12/01/15  Yes [provider]    Physical Exam: Vitals:   10/18/16 0000 10/18/16 0022 10/18/16 0100 10/18/16 0448  BP: 120/72 120/72 (!) 103/57 (!) 167/97  Pulse: 70 74 79 73  Resp: (!) 23 (!) 25 (!) 30 18  Temp:    98.3 F (36.8 C)  TempSrc:    Oral  SpO2: 95% 95% 95% 96%  Weight:    133.4 kg (294 lb 1.6 oz)  Height:    5\' 6"  (1.676 m)      Constitutional: Moderately built and nourished. Vitals:   10/18/16 0000 10/18/16 0022 10/18/16 0100 10/18/16 0448  BP: 120/72 120/72 (!) 103/57 (!) 167/97  Pulse: 70 74 79 73  Resp: (!) 23 (!) 25 (!) 30 18  Temp:    98.3 F (36.8 C)  TempSrc:    Oral    SpO2: 95% 95% 95% 96%  Weight:    133.4 kg (294 lb 1.6 oz)  Height:    5\' 6"  (1.676 m)   Eyes: Anicteric no pallor. ENMT: No discharge from the ears eyes nose and mouth. Neck: No mass felt. No neck rigidity. Respiratory: No rhonchi or crepitations. Cardiovascular: S1-S2. No murmurs appreciated. Abdomen: Soft nontender bowel sounds present. Musculoskeletal: No edema. No joint effusion. Skin: No rash. Skin appears warm. Neurologic: Alert awake oriented to time place and person. Moves all extremities. Psychiatric: Appears normal. Normal affect.   Labs on Admission: I have personally reviewed following labs and imaging studies  CBC:  Recent Labs Lab 10/17/16 1717  WBC 17.9*  HGB 14.5  HCT 44.7  MCV 83.7  PLT 382   Basic Metabolic Panel:  Recent Labs Lab 10/17/16 1717  NA 140  K 3.3*  CL 107  CO2 24  GLUCOSE 100*  BUN 10  CREATININE 0.91  CALCIUM 9.3   GFR: Estimated Creatinine Clearance: 107.3 mL/min (by C-G formula based on SCr of 0.91 mg/dL). Liver Function Tests: No results for input(s): AST, ALT, ALKPHOS, BILITOT, PROT, ALBUMIN in the last 168 hours. No results for input(s): LIPASE, AMYLASE in the last 168 hours. No results for input(s): AMMONIA in the last 168 hours. Coagulation Profile:  Recent Labs Lab 10/18/16 0023  INR 1.03   Cardiac Enzymes:  Recent Labs Lab 10/18/16 0006  TROPONINI 0.04*   BNP (last 3 results) No results for input(s): PROBNP in the last 8760 hours. HbA1C: No results for input(s): HGBA1C in the last 72 hours. CBG: No results for input(s): GLUCAP in the last 168 hours. Lipid Profile: No results for input(s): CHOL, HDL, LDLCALC, TRIG, CHOLHDL, LDLDIRECT in the last 72 hours. Thyroid Function Tests: No results for input(s): TSH, T4TOTAL, FREET4, T3FREE, THYROIDAB in the last 72 hours. Anemia Panel: No results for input(s): VITAMINB12, FOLATE, FERRITIN, TIBC, IRON, RETICCTPCT in the last 72 hours. Urine analysis:     Component Value Date/Time   COLORURINE AMBER (A) 05/07/2015 1929   APPEARANCEUR CLEAR 05/07/2015 1929   LABSPEC 1.027 05/07/2015 1929   PHURINE 6.0 05/07/2015 1929   GLUCOSEU NEGATIVE 05/07/2015 1929   HGBUR NEGATIVE 05/07/2015 1929   BILIRUBINUR SMALL (A) 05/07/2015 1929   KETONESUR 15 (A) 05/07/2015 1929   PROTEINUR 30 (A) 05/07/2015 1929   UROBILINOGEN 0.2 12/03/2014 1850   NITRITE NEGATIVE 05/07/2015 1929   LEUKOCYTESUR NEGATIVE 05/07/2015 1929   Sepsis Labs: @LABRCNTIP (procalcitonin:4,lacticidven:4) )No results found for this or any previous visit (from the past 240 hour(s)).   Radiological Exams on Admission: Dg Chest 2 View  Result Date: 10/17/2016 CLINICAL DATA:  Chest and left arm tightness and hot feeling in the left aspect of the face for the past 3 hours. Current smoker. EXAM: CHEST  2 VIEW COMPARISON:  Chest x-ray of Oct 11, 2016 FINDINGS: The lungs are adequately inflated. The interstitial markings remain increased diffusely. The pulmonary vascularity is less prominent today. The cardiac silhouette is normal in size. The mediastinum is normal in width. The bony thorax is unremarkable. IMPRESSION: Chronic interstitial prominence may reflect chronic bronchitis and the patient's smoking history. No pneumonia nor pulmonary edema. Electronically Signed   By: David  [place] M.D.   On: 10/17/2016 17:13   Ct Angio Chest Pe W Or Wo Contrast  Result Date: 10/18/2016 CLINICAL DATA:  Left upper chest pain times several hours radiating to the back with dyspnea and cough. EXAM: CT ANGIOGRAPHY CHEST WITH CONTRAST TECHNIQUE: Multidetector CT imaging of the chest was performed using the standard protocol during bolus administration of intravenous contrast. Multiplanar CT image reconstructions and MIPs were obtained to evaluate the vascular anatomy. CONTRAST:  100 cc Isovue 370 IV COMPARISON:  None. FINDINGS: Cardiovascular: No acute pulmonary embolus to the third order branches. No aortic  aneurysm or dissection. Normal size cardiac chambers. No pericardial effusion. Mediastinum/Nodes: Mildly enlarged hilar lymph nodes may be reactive in etiology. No mediastinal lymphadenopathy. Normal appearing thyroid gland esophagus. The trachea and mainstem bronchi are patent. Lungs/Pleura: There is no pneumonic consolidation, pneumothorax or effusion. Mild emphysematous change in the upper lobes and left lung base. Bibasilar dependent atelectasis. Upper Abdomen: No acute abnormality Musculoskeletal: No chest wall abnormality. No acute or significant osseous findings. Review of the MIP images confirms the above findings. IMPRESSION: 1. No acute pulmonary embolus, aortic aneurysm or dissection. 2. Dependent atelectasis within the lungs without pneumonic consolidation or pneumothorax. Electronically Signed   By: Tollie Eth M.D.   On: 10/18/2016 02:58    EKG: Independently reviewed. Normal sinus rhythm with nonspecific ST-T changes.  Assessment/Plan Principal Problem:   Chest pain Active Problems:   Leucocytosis    1. Chest pain concerning for unstable angina - troponin is mildly elevated. Patient's chest pain improved with sublingual nitroglycerin and I have placed patient on nitroglycerin patch, Lipitor and metoprolol. Patient is on heparin. Cycle cardiac markers. Check 2-D echo. Will keep patient nothing by mouth in anticipation of possible cardiac procedure. Consult cardiologist in a.m. Patient's chest pain does increase on lying down but EKG does not show any signs of pericarditis. 2. Leukocytosis probably from recent use of prednisone. Follow CBC. Patient is afebrile. 3. Elevated blood pressure - patient is placed on on metoprolol and nitroglycerin patch. Closely follow blood pressure trends. 4. History of tobacco abuse - quit 5 months ago.   DVT prophylaxis: Heparin infusion. Code Status: Full code.  Family Communication: Discussed with patient.  Disposition Plan: Home.  Consults called:  None. ER physician discussed with cardiologist.  Admission status: Observation.    Eduard Clos MD [facility] Pager [phone].  If 7PM-7AM, please contact night-coverage [email or web address] Password [email or web address]  10/18/2016, 5:30 AM

## 2016-10-18 NOTE — Progress Notes (Signed)
  Echocardiogram 2D Echocardiogram has been performed.  Leta JunglingCooper, Alaena Strader M 10/18/2016, 1:15 PM

## 2016-10-18 NOTE — Progress Notes (Signed)
  Echocardiogram 2D Echocardiogram has been performed.  Kamaree Berkel M 10/18/2016, 1:15 PM 

## 2016-10-18 NOTE — Progress Notes (Signed)
ANTICOAGULATION CONSULT NOTE - Initial Consult  Pharmacy Consult for IV heparin Indication: chest pain/ACS  Allergies  Allergen Reactions  . Percocet [Oxycodone-Acetaminophen] Itching  . Vicodin [Hydrocodone-Acetaminophen] Itching    Patient Measurements:   Heparin Dosing Weight: 81 kg  Vital Signs: Temp: 98.1 F (36.7 C) (05/17 1639) Temp Source: Oral (05/17 1639) BP: 103/57 (05/18 0100) Pulse Rate: 79 (05/18 0100)  Labs:  Recent Labs  10/17/16 1717 10/18/16 0006  HGB 14.5  --   HCT 44.7  --   PLT 382  --   CREATININE 0.91  --   TROPONINI  --  0.04*    Estimated Creatinine Clearance: 107.3 mL/min (by C-G formula based on SCr of 0.91 mg/dL).   Medical History: Past Medical History:  Diagnosis Date  . Back pain   . Bronchitis   . Pneumonia     Medications:  Scheduled:  . heparin  4,000 Units Intravenous Once   Infusions:  . heparin    . sodium chloride      Assessment: 45 yoF c/o CP radiating down left arm.  Trop=0.04 and D-dimer = 2.78  IV heparin per Rx for ACS Goal of Therapy:  Heparin level 0.3-0.7 units/ml Monitor platelets by anticoagulation protocol: Yes   Plan:  Baseline coags STAT Heparin 4000 unit bolus x1  Drip at 1000 units/hr Daily CBC/HL Check 1st HL in 6 hours  Lorenza EvangelistGreen, Adalene Gulotta R 10/18/2016,1:36 AM

## 2016-10-19 LAB — HIV ANTIBODY (ROUTINE TESTING W REFLEX): HIV SCREEN 4TH GENERATION: NONREACTIVE

## 2016-10-25 ENCOUNTER — Telehealth (HOSPITAL_COMMUNITY): Payer: Self-pay

## 2016-10-25 NOTE — Telephone Encounter (Signed)
Encounter complete. 

## 2016-10-30 ENCOUNTER — Ambulatory Visit (HOSPITAL_COMMUNITY)
Admit: 2016-10-30 | Discharge: 2016-10-30 | Disposition: A | Discharge status: Home / Self Care | Payer: 59 | Source: Ambulatory Visit | Attending: Internal Medicine | Admitting: Internal Medicine

## 2016-10-30 DIAGNOSIS — F17200 Nicotine dependence, unspecified, uncomplicated: Secondary | ICD-10-CM | POA: Diagnosis not present

## 2016-10-30 DIAGNOSIS — R5383 Other fatigue: Secondary | ICD-10-CM | POA: Insufficient documentation

## 2016-10-30 DIAGNOSIS — R0609 Other forms of dyspnea: Secondary | ICD-10-CM | POA: Diagnosis not present

## 2016-10-30 DIAGNOSIS — E669 Obesity, unspecified: Secondary | ICD-10-CM | POA: Insufficient documentation

## 2016-10-30 DIAGNOSIS — Z6841 Body Mass Index (BMI) 40.0 and over, adult: Secondary | ICD-10-CM | POA: Diagnosis not present

## 2016-10-30 DIAGNOSIS — Z8249 Family history of ischemic heart disease and other diseases of the circulatory system: Secondary | ICD-10-CM | POA: Insufficient documentation

## 2016-10-30 DIAGNOSIS — R0789 Other chest pain: Secondary | ICD-10-CM | POA: Insufficient documentation

## 2016-10-30 MED ORDER — REGADENOSON 0.4 MG/5ML IV SOLN
0.4000 mg | Freq: Once | INTRAVENOUS | Status: AC
  Administered 2016-10-30: 0.4 mg via INTRAVENOUS

## 2016-10-30 MED ORDER — TECHNETIUM TC 99M TETROFOSMIN IV KIT
28.9000 | PACK | Freq: Once | INTRAVENOUS | Status: AC | PRN
  Administered 2016-10-30: 28.9 via INTRAVENOUS
  Filled 2016-10-30: qty 29

## 2016-10-31 ENCOUNTER — Ambulatory Visit (HOSPITAL_COMMUNITY)
Admit: 2016-10-31 | Discharge: 2016-10-31 | Disposition: A | Discharge status: Home / Self Care | Payer: 59 | Attending: Cardiovascular Disease | Admitting: Cardiovascular Disease

## 2016-10-31 LAB — MYOCARDIAL PERFUSION IMAGING
CHL CUP RESTING HR STRESS: 71 {beats}/min
CSEPPHR: 90 {beats}/min
LV dias vol: 93 mL (ref 46–106)
LVSYSVOL: 34 mL
NUC STRESS TID: 0.72
SDS: 3
SRS: 5
SSS: 8

## 2016-10-31 MED ORDER — TECHNETIUM TC 99M TETROFOSMIN IV KIT
28.1000 | PACK | Freq: Once | INTRAVENOUS | Status: AC | PRN
  Administered 2016-10-31: 28.1 via INTRAVENOUS

## 2016-11-05 ENCOUNTER — Ambulatory Visit: Payer: 59 | Admitting: Cardiology

## 2016-11-08 ENCOUNTER — Encounter: Payer: Self-pay | Admitting: Physician Assistant

## 2016-11-08 ENCOUNTER — Ambulatory Visit (INDEPENDENT_AMBULATORY_CARE_PROVIDER_SITE_OTHER): Payer: 59 | Admitting: Physician Assistant

## 2016-11-08 VITALS — BP 122/78 | HR 78 | Ht 66.0 in | Wt 291.8 lb

## 2016-11-08 DIAGNOSIS — R7989 Other specified abnormal findings of blood chemistry: Secondary | ICD-10-CM

## 2016-11-08 DIAGNOSIS — Z79899 Other long term (current) drug therapy: Secondary | ICD-10-CM

## 2016-11-08 DIAGNOSIS — R0789 Other chest pain: Secondary | ICD-10-CM | POA: Diagnosis not present

## 2016-11-08 DIAGNOSIS — R748 Abnormal levels of other serum enzymes: Secondary | ICD-10-CM

## 2016-11-08 DIAGNOSIS — R778 Other specified abnormalities of plasma proteins: Secondary | ICD-10-CM

## 2016-11-08 LAB — LIPID PANEL
CHOLESTEROL TOTAL: 129 mg/dL (ref 100–199)
Chol/HDL Ratio: 3.6 ratio (ref 0.0–4.4)
HDL: 36 mg/dL — AB (ref >39)
LDL CALC: 61 mg/dL (ref 0–99)
Triglycerides: 162 mg/dL — ABNORMAL HIGH (ref 0–149)
VLDL CHOLESTEROL CAL: 32 mg/dL (ref 5–40)

## 2016-11-08 NOTE — Progress Notes (Signed)
Cardiology Office Note    Date:  11/08/2016   ID:  Kelsey Morgan, DOB Apr 09, 1971, MRN [id number]  PCP:  Deatra James, MD  Cardiologist:  Dr. Antoine Poche  Chief Complaint  Patient presents with  . Follow-up    seen for Dr. Antoine Poche    History of Present Illness:  Kelsey Morgan is a 46 y.o. female with no prior cardiac history who recently presented on 11/08/2016 with chest pain. D-dimer was elevated, however CT angiogram was negative for PE. EKG showed nonspecific ST changes and prolonged QTC. Troponin was borderline elevated at 0.04. She reported recent history of bronchitis and was finishing a course of steroid taper. Her symptom was very atypical for angina. Echocardiogram obtained on 10/18/2016 showed EF 55-60%, grade 1 diastolic dysfunction. She was discharged to follow-up with cardiology as outpatient and for outpatient Myoview. She underwent Myoview on 10/31/2016, this showed EF 64%, no reversible ischemia, overall considered low risk study. She presents today for follow-up.  Patient presents today for cardiology office visit. She drives a truck. She says chest pain first started roughly 2 weeks prior to the ED presentation, since then, she has not had any chest discomfort. The initial chest pain never correlated with exertion. She says occasionally she has lower extremity edema if she sits in the truck for too long, otherwise the edema goes away by itself with leg elevation. She does not appear to have any sign of heart failure on physical exam. I do not think she needed a scheduled diuretic medication. No further workup this is expected in this case. She can follow-up with cardiology on an as-needed basis.   Past Medical History:  Diagnosis Date  . Back pain   . Bronchitis   . Pneumonia     Past Surgical History:  Procedure Laterality Date  . ABDOMINAL HYSTERECTOMY    . ANKLE SURGERY    . CHOLECYSTECTOMY    . KNEE SURGERY      Current Medications: Outpatient Medications  Prior to Visit  Medication Sig Dispense Refill  . albuterol (PROVENTIL HFA;VENTOLIN HFA) 108 (90 BASE) MCG/ACT inhaler Inhale 1-2 puffs into the lungs every 6 (six) hours as needed for wheezing or shortness of breath. (Patient taking differently: Inhale 2 puffs into the lungs every 4 (four) hours as needed for wheezing or shortness of breath. ) 1 Inhaler 0  . albuterol (PROVENTIL) (2.5 MG/3ML) 0.083% nebulizer solution Take 2.5 mg by nebulization every 6 (six) hours as needed for wheezing or shortness of breath.    Marland Kitchen aspirin 81 MG chewable tablet Chew 81 mg by mouth daily.    Marland Kitchen aspirin-acetaminophen-caffeine (EXCEDRIN EXTRA STRENGTH) 250-250-65 MG tablet Take 2 tablets by mouth every 6 (six) hours as needed for headache or migraine (and pain).    Marland Kitchen atorvastatin (LIPITOR) 40 MG tablet Take 1 tablet (40 mg total) by mouth daily at 6 PM. 30 tablet 2  . fluticasone (FLONASE) 50 MCG/ACT nasal spray Place 2 sprays into both nostrils daily.    . Multiple Vitamin (MULTIVITAMIN WITH MINERALS) TABS tablet Take 1 tablet by mouth daily.    . sertraline (ZOLOFT) 100 MG tablet Take 100 mg by mouth at bedtime.     Marland Kitchen tiZANidine (ZANAFLEX) 4 MG tablet Take 8 mg by mouth daily.   2  . guaiFENesin (MUCINEX) 600 MG 12 hr tablet Take 1,200 mg by mouth 2 (two) times daily.    . metoprolol tartrate (LOPRESSOR) 25 MG tablet Take 0.5 tablets (12.5 mg total) by  mouth 2 (two) times daily. 60 tablet 2   No facility-administered medications prior to visit.      Allergies:   Percocet [oxycodone-acetaminophen] and Vicodin [hydrocodone-acetaminophen]   Social History   Social History  . Marital status: Single    Spouse name: N/A  . Number of children: N/A  . Years of education: N/A   Occupational History  . Truck driver    Social History Main Topics  . Smoking status: Current Every Day Smoker    Packs/day: 0.50    Years: 25.00    Types: Cigarettes  . Smokeless tobacco: Never Used  . Alcohol use No  . Drug use:  No  . Sexual activity: Not Currently   Other Topics Concern  . None   Social History Narrative   Lives with partner.       Family History:  The patient's family history includes Hypertension in her mother; Kidney failure in her mother.   ROS:   Please see the history of present illness.    ROS All other systems reviewed and are negative.   PHYSICAL EXAM:   VS:  BP 122/78 (BP Location: Right Arm, Patient Position: Sitting, Cuff Size: Large)   Pulse 78   Ht 5\' 6"  (1.676 m)   Wt 291 lb 12.8 oz (132.4 kg)   BMI 47.10 kg/m    GEN: Well nourished, well developed, in no acute distress  HEENT: normal  Neck: no JVD, carotid bruits, or masses Cardiac: RRR; no murmurs, rubs, or gallops,no edema  Respiratory:  clear to auscultation bilaterally, normal work of breathing GI: soft, nontender, nondistended, + BS MS: no deformity or atrophy  Skin: warm and dry, no rash Neuro:  Alert and Oriented x 3, Strength and sensation are intact Psych: euthymic mood, full affect  Wt Readings from Last 3 Encounters:  11/08/16 291 lb 12.8 oz (132.4 kg)  10/30/16 294 lb (133.4 kg)  10/18/16 294 lb 1.6 oz (133.4 kg)      Studies/Labs Reviewed:   EKG:  EKG is not ordered today.   Recent Labs: 10/18/2016: ALT 21; BUN 10; Creatinine, Ser 0.88; Hemoglobin 12.9; Platelets 361; Potassium 3.4; Sodium 139   Lipid Panel    Component Value Date/Time   CHOL 129 11/08/2016 0947   TRIG 162 (H) 11/08/2016 0947   HDL 36 (L) 11/08/2016 0947   CHOLHDL 3.6 11/08/2016 0947   LDLCALC 61 11/08/2016 0947    Additional studies/ records that were reviewed today include:   Echo 10/18/2016 LV EF: 55% -   60%  Study Conclusions  - Left ventricle: The cavity size was normal. Wall thickness was   normal. Systolic function was normal. The estimated ejection   fraction was in the range of 55% to 60%. Wall motion was normal;   there were no regional wall motion abnormalities. Doppler   parameters are consistent  with abnormal left ventricular   relaxation (grade 1 diastolic dysfunction).  Impressions:  - Normal LV systolic function; mild diastolic dysfunction.    Myoview 10/31/2016 Study Highlights     The left ventricular ejection fraction is normal (55-65%).  Nuclear stress EF: 64%.  No T wave inversion was noted during stress.  There was no ST segment deviation noted during stress.  This is a low risk study.   No reversible ischemia. LVEF 64% with normal wall motion. This is a low risk study.      ASSESSMENT:    1. Atypical chest pain   2. Encounter for long-term (  current) use of medications   3. Elevated troponin level      PLAN:  In order of problems listed above:  1. Atypical chest pain: Recent normal echocardiogram and negative Myoview. Despite her borderline troponin, her chest pain is very atypical. No further workup is expected in this case. She can stop her metoprolol. As for Lipitor, I recommended obtaining a fasting lipid panel today, if elevated, I would recommend stay on Lipitor. She can follow-up on an as-needed basis.    Medication Adjustments/Labs and Tests Ordered: Current medicines are reviewed at length with the patient today.  Concerns regarding medicines are outlined above.  Medication changes, Labs and Tests ordered today are listed in the Patient Instructions below. Patient Instructions  Medication Instructions:   STOP metoprolol  Labwork:   Fasting lipids today Liver function test in 6 weeks   Testing/Procedures:   Follow-Up:  See your primary care physician next.  You may follow up with us in 1 year, or on as needed basis.   If you need a refill on your cardiac medications before your next appointment, please call your pharmacy.      Ramond DialSigned, [place], [place]  11/08/2016 11:32 PM    [facility] [address], [place], [place]  [address] Phone: [phone]; Fax: [phone]

## 2016-11-08 NOTE — Patient Instructions (Addendum)
Medication Instructions:   STOP metoprolol  Labwork:   Fasting lipids today Liver function test in 6 weeks   Testing/Procedures:   Follow-Up:  See your primary care physician next.  You may follow up with us in 1 year, or on as needed basis.   If you need a refill on your cardiac medications before your next appointment, please call your pharmacy.

## 2017-03-28 ENCOUNTER — Emergency Department (HOSPITAL_COMMUNITY): Payer: 59

## 2017-03-28 ENCOUNTER — Emergency Department (HOSPITAL_COMMUNITY)
Admission: EM | Admit: 2017-03-28 | Discharge: 2017-03-28 | Disposition: A | Discharge status: Home / Self Care | Payer: 59 | Attending: Emergency Medicine | Admitting: Emergency Medicine

## 2017-03-28 ENCOUNTER — Other Ambulatory Visit (HOSPITAL_COMMUNITY): Payer: Self-pay

## 2017-03-28 ENCOUNTER — Encounter (HOSPITAL_COMMUNITY): Payer: Self-pay | Admitting: *Deleted

## 2017-03-28 ENCOUNTER — Emergency Department (HOSPITAL_BASED_OUTPATIENT_CLINIC_OR_DEPARTMENT_OTHER): Admit: 2017-03-28 | Discharge: 2017-03-28 | Disposition: A | Discharge status: Home / Self Care | Payer: 59

## 2017-03-28 DIAGNOSIS — Z9889 Other specified postprocedural states: Secondary | ICD-10-CM | POA: Diagnosis not present

## 2017-03-28 DIAGNOSIS — F17210 Nicotine dependence, cigarettes, uncomplicated: Secondary | ICD-10-CM | POA: Insufficient documentation

## 2017-03-28 DIAGNOSIS — M79672 Pain in left foot: Secondary | ICD-10-CM | POA: Diagnosis not present

## 2017-03-28 DIAGNOSIS — M7989 Other specified soft tissue disorders: Secondary | ICD-10-CM | POA: Insufficient documentation

## 2017-03-28 DIAGNOSIS — Z79899 Other long term (current) drug therapy: Secondary | ICD-10-CM | POA: Insufficient documentation

## 2017-03-28 DIAGNOSIS — Z7982 Long term (current) use of aspirin: Secondary | ICD-10-CM | POA: Insufficient documentation

## 2017-03-28 LAB — BASIC METABOLIC PANEL
ANION GAP: 8 (ref 5–15)
BUN: 10 mg/dL (ref 6–20)
CO2: 22 mmol/L (ref 22–32)
Calcium: 9 mg/dL (ref 8.9–10.3)
Chloride: 110 mmol/L (ref 101–111)
Creatinine, Ser: 0.81 mg/dL (ref 0.44–1.00)
GFR calc Af Amer: >60 mL/min (ref >60)
Glucose, Bld: 83 mg/dL (ref 65–99)
POTASSIUM: 3.7 mmol/L (ref 3.5–5.1)
SODIUM: 140 mmol/L (ref 135–145)

## 2017-03-28 LAB — CBC WITH DIFFERENTIAL/PLATELET
BASOS ABS: 0 10*3/uL (ref 0.0–0.1)
BASOS PCT: 0 %
EOS ABS: 0.3 10*3/uL (ref 0.0–0.7)
EOS PCT: 2 %
HCT: 43 % (ref 36.0–46.0)
Hemoglobin: 14.3 g/dL (ref 12.0–15.0)
Lymphocytes Relative: 23 %
Lymphs Abs: 3 10*3/uL (ref 0.7–4.0)
MCH: 27.4 pg (ref 26.0–34.0)
MCHC: 33.3 g/dL (ref 30.0–36.0)
MCV: 82.5 fL (ref 78.0–100.0)
Monocytes Absolute: 1 10*3/uL (ref 0.1–1.0)
Monocytes Relative: 8 %
Neutro Abs: 8.8 10*3/uL — ABNORMAL HIGH (ref 1.7–7.7)
Neutrophils Relative %: 67 %
PLATELETS: 339 10*3/uL (ref 150–400)
RBC: 5.21 MIL/uL — AB (ref 3.87–5.11)
RDW: 15.5 % (ref 11.5–15.5)
WBC: 13.2 10*3/uL — AB (ref 4.0–10.5)

## 2017-03-28 LAB — SEDIMENTATION RATE: SED RATE: 28 mm/h — AB (ref 0–22)

## 2017-03-28 MED ORDER — PREDNISONE 50 MG PO TABS
50.0000 mg | ORAL_TABLET | Freq: Every day | ORAL | 0 refills | Status: DC
Start: 2017-03-28 — End: 2017-10-23

## 2017-03-28 MED ORDER — GADOBENATE DIMEGLUMINE 529 MG/ML IV SOLN
20.0000 mL | Freq: Once | INTRAVENOUS | Status: AC | PRN
  Administered 2017-03-28: 20 mL via INTRAVENOUS

## 2017-03-28 MED ORDER — OXYCODONE-ACETAMINOPHEN 5-325 MG PO TABS
1.0000 | ORAL_TABLET | Freq: Once | ORAL | Status: AC
  Administered 2017-03-28: 1 via ORAL
  Filled 2017-03-28: qty 1

## 2017-03-28 MED ORDER — PREDNISONE 20 MG PO TABS
60.0000 mg | ORAL_TABLET | Freq: Once | ORAL | Status: AC
  Administered 2017-03-28: 60 mg via ORAL
  Filled 2017-03-28: qty 3

## 2017-03-28 MED ORDER — OXYCODONE-ACETAMINOPHEN 5-325 MG PO TABS: 1.0000 | ORAL_TABLET | ORAL | 0 refills | Status: DC | PRN

## 2017-03-28 MED ORDER — DIPHENHYDRAMINE HCL 25 MG PO CAPS
25.0000 mg | ORAL_CAPSULE | Freq: Once | ORAL | Status: AC
  Administered 2017-03-28: 25 mg via ORAL
  Filled 2017-03-28: qty 1

## 2017-03-28 NOTE — Progress Notes (Signed)
LLE venous duplex  has been completed. Preliminary results can be found: Chart review -> CV Proc  Adrick Kestler Eunice, RDMS, RVT   

## 2017-03-28 NOTE — Discharge Instructions (Signed)
Your tests and emergency did not show an obvious cause for your pain and swelling. Please take prednisone once a day. Take oxycodone-acetaminophen as needed for severe pain, but take diphenhydramine (Benadryl) with each dose to prevent itching. Follow up with the orthopedic doctor who worked on your foot. Return if symptoms are getting worse.

## 2017-03-28 NOTE — ED Notes (Signed)
Patient transported to X-ray 

## 2017-03-28 NOTE — ED Notes (Signed)
Ultrasound at bedside

## 2017-03-28 NOTE — ED Notes (Signed)
Bed: WA13 Expected date:  Expected time:  Means of arrival:  Comments: 

## 2017-03-28 NOTE — ED Provider Notes (Signed)
[facility]-EMERGENCY DEPT Provider Note   CSN: [id number] Arrival date & time: 03/28/17  0850     History   Chief Complaint Chief Complaint  Patient presents with  . Leg Pain    HPI Christen Bamehyllis M Broughton is a 46 y.o. female.  The history is provided by the patient.  She has a history of crush injury to left foot with multiple surgeries. Over the last week, she has noted increasing pain in her left foot with radiation to the left lower leg. She has been taking acetaminophen and naproxen without relief. Pain is rated at 10/10. Is worse with applying pressure or trying to walk. Nothing makes it better. She denies fever or chills. She denies chest pain or dyspnea. 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 She denies any injury.  Past Medical History:  Diagnosis Date  . Back pain   . Bronchitis   . Pneumonia     Patient Active Problem List   Diagnosis Date Noted  . Chest pain 10/18/2016  . Leucocytosis 10/18/2016    Past Surgical History:  Procedure Laterality Date  . ABDOMINAL HYSTERECTOMY    . ANKLE SURGERY    . CHOLECYSTECTOMY    . KNEE SURGERY      OB History    No data available       Home Medications    Prior to Admission medications   Medication Sig Start Date End Date Taking? Authorizing Provider  albuterol (PROVENTIL HFA;VENTOLIN HFA) 108 (90 BASE) MCG/ACT inhaler Inhale 1-2 puffs into the lungs every 6 (six) hours as needed for wheezing or shortness of breath. Patient taking differently: Inhale 2 puffs into the lungs every 4 (four) hours as needed for wheezing or shortness of breath.  12/03/14   Arby BarrettePfeiffer, Marcy, MD  albuterol (PROVENTIL) (2.5 MG/3ML) 0.083% nebulizer solution Take 2.5 mg by nebulization every 6 (six) hours as needed for wheezing or shortness of breath.    [provider]  aspirin 81 MG chewable tablet Chew 81 mg by mouth daily.    [provider]  aspirin-acetaminophen-caffeine (EXCEDRIN EXTRA  STRENGTH) 3197856837250-250-65 MG tablet Take 2 tablets by mouth every 6 (six) hours as needed for headache or migraine (and pain).    [provider]  atorvastatin (LIPITOR) 40 MG tablet Take 1 tablet (40 mg total) by mouth daily at 6 PM. 10/19/16   Philip AspenHernandez Acosta, Limmie PatriciaEstela Y, MD  fluticasone [facility](FLONASE) 50 MCG/ACT nasal spray Place 2 sprays into both nostrils daily.    [provider]  Multiple Vitamin (MULTIVITAMIN WITH MINERALS) TABS tablet Take 1 tablet by mouth daily.    [provider]  sertraline (ZOLOFT) 100 MG tablet Take 100 mg by mouth at bedtime.     [provider]  tiZANidine (ZANAFLEX) 4 MG tablet Take 8 mg by mouth daily.  12/01/15   [provider]    Family History Family History  Problem Relation Age of Onset  . Hypertension Mother   . Kidney failure Mother     Social History Social History  Substance Use Topics  . Smoking status: Current Every Day Smoker    Packs/day: 0.50    Years: 25.00    Types: Cigarettes  . Smokeless tobacco: Never Used  . Alcohol use No     Allergies   Percocet [oxycodone-acetaminophen] and Vicodin [hydrocodone-acetaminophen]   Review of Systems Review of Systems  All other systems reviewed and are negative.    Physical Exam Updated Vital Signs  BP (!) 152/78 (BP Location: Right Arm)   Pulse 75   Temp 97.7 F (36.5 C) (Oral)   Resp 18   SpO2 98%   Physical Exam  Nursing note and vitals reviewed.  46 year old female, resting comfortably and in no acute distress. Vital signs are significant for hypertension. Oxygen saturation is 98%, which is normal. Head is normocephalic and atraumatic. PERRLA, EOMI. Oropharynx is clear. Neck is nontender and supple without adenopathy or JVD. Back is nontender and there is no CVA tenderness. Lungs are clear without rales, wheezes, or rhonchi. Chest is nontender. Heart has regular rate and rhythm without murmur. Abdomen is soft, flat, nontender without  masses or hepatosplenomegaly and peristalsis is normoactive. Extremities 1+ pretibial edema. Circumference is symmetric. Surgical scar present on the dorsum of the left foot. Left foot is generally swollen and tender. There is no point tenderness. There is no significant warmth compared with the other side. There is mild tenderness of the left calf without any palpable cords. Skin is warm and dry without rash. Neurologic: Mental status is normal, cranial nerves are intact, there are no motor or sensory deficits.  ED Treatments / Results  Labs (all labs ordered are listed, but only abnormal results are displayed) Labs Reviewed  CBC WITH DIFFERENTIAL/PLATELET - Abnormal; Notable for the following:       Result Value   WBC 13.2 (*)    RBC 5.21 (*)    Neutro Abs 8.8 (*)    All other components within normal limits  SEDIMENTATION RATE - Abnormal; Notable for the following:    Sed Rate 28 (*)    All other components within normal limits  BASIC METABOLIC PANEL    Radiology Mr Foot Left W Wo Contrast  Result Date: 03/28/2017 CLINICAL DATA:  Left foot pain, swelling and difficulty weight-bearing for 1 week. Swelling and skin discoloration about the left ankle and foot. No recent injury. EXAM: MRI OF THE LEFT FOREFOOT WITHOUT AND WITH CONTRAST TECHNIQUE: Multiplanar, multisequence MR imaging of the left forefoot was performed both before and after administration of intravenous contrast. CONTRAST:  20 ml MULTIHANCE GADOBENATE DIMEGLUMINE 529 MG/ML IV SOLN COMPARISON:  Plain films of the left foot this same day. FINDINGS: Bones/Joint/Cartilage Bone marrow signal is normal without fracture, stress change or focal lesion. No evidence of osteomyelitis. No arthropathy is seen. Ligaments Intact. Muscles and Tendons Intact.  No intramuscular fluid collection or edema. Soft tissues Normal.  No fluid collection or mass. IMPRESSION: Negative examination.  No finding to explain the patient's symptoms.  Electronically Signed   By: Drusilla Kanner M.D.   On: 03/28/2017 15:30   Dg Foot Complete Left  Result Date: 03/28/2017 CLINICAL DATA:  Pain left foot, swelling.  No known injury. EXAM: LEFT FOOT - COMPLETE 3+ VIEW COMPARISON:  None. FINDINGS: There is no evidence of fracture or dislocation. There is no evidence of arthropathy or other focal bone abnormality. Soft tissues are unremarkable. IMPRESSION: Negative. Electronically Signed   By: Charlett Nose M.D.   On: 03/28/2017 11:57    Procedures Procedures (including critical care time)  Medications Ordered in ED Medications  predniSONE (DELTASONE) tablet 60 mg (not administered)  oxyCODONE-acetaminophen (PERCOCET/ROXICET) 5-325 MG per tablet 1 tablet (1 tablet Oral Given 03/28/17 1033)  diphenhydrAMINE (BENADRYL) capsule 25 mg (25 mg Oral Given 03/28/17 1033)  gadobenate dimeglumine (MULTIHANCE) injection 20 mL (20 mLs Intravenous Contrast Given 03/28/17 1508)     Initial Impression / Assessment and Plan / ED Course  I have reviewed the triage vital signs and the nursing notes.  Pertinent labs & imaging results that were available during my care of the patient were reviewed by me and considered in my medical decision making (see chart for details).  Left foot pain of uncertain cause. She will be sent for x-rays, and we'll check screening labs. Also, will send for venous Doppler. Old records are reviewed, and she has no relevant past visits.  Dopplers negative for DVT. Laboratory workup shows mild leukocytosis but without a left shift. Sedimentation rate was minimally elevated. X-ray show no evidence of fracture. Because cause of pain was unclear, and history of multiple surgeries, decision was made to send for MRI with and without contrast. This has also come back showing no obvious cause for her pain. She did get good relief of pain with oxycodone-acetaminophen. Of note, Percocet is listed under allergies, but she states that it causes  itching and she takes diphenhydramine with doses of Percocet to prevent the itching. Cause for her pain is still unclear. I wonder if this might be gout. Findings are somewhat atypical, but might be affected by multiple prior surgeries. She is given a dose of prednisone and discharged with prescription for prednisone and oxycodone have acetaminophen. Told to follow-up with the orthopedic physician who had done all they work on her foot. Return to emergency department if symptoms worsen.  Final Clinical Impressions(s) / ED Diagnoses   Final diagnoses:  Pain in left foot  Swelling of left foot    New Prescriptions New Prescriptions   No medications on file     Dione Booze, MD 03/28/17 1601

## 2017-03-28 NOTE — ED Triage Notes (Signed)
Pt complains of left foot pain radiating to her toes and lower leg x 1 week. Pt also has discoloration and swelling to ankle. Pt denies recent injury, has hx of crush injury to left foot in 2005 and 9 surgeries to left foot since.

## 2017-04-06 ENCOUNTER — Encounter (HOSPITAL_COMMUNITY): Payer: Self-pay

## 2017-04-06 ENCOUNTER — Inpatient Hospital Stay (HOSPITAL_COMMUNITY)
Admission: AD | Admit: 2017-04-06 | Discharge: 2017-04-06 | Disposition: A | Discharge status: Home / Self Care | Payer: 59 | Source: Ambulatory Visit | Attending: Family Medicine | Admitting: Family Medicine

## 2017-04-06 DIAGNOSIS — R198 Other specified symptoms and signs involving the digestive system and abdomen: Secondary | ICD-10-CM

## 2017-04-06 DIAGNOSIS — Z885 Allergy status to narcotic agent status: Secondary | ICD-10-CM | POA: Insufficient documentation

## 2017-04-06 DIAGNOSIS — F17210 Nicotine dependence, cigarettes, uncomplicated: Secondary | ICD-10-CM | POA: Insufficient documentation

## 2017-04-06 DIAGNOSIS — Z79899 Other long term (current) drug therapy: Secondary | ICD-10-CM | POA: Diagnosis not present

## 2017-04-06 DIAGNOSIS — Z79891 Long term (current) use of opiate analgesic: Secondary | ICD-10-CM | POA: Insufficient documentation

## 2017-04-06 DIAGNOSIS — Z7982 Long term (current) use of aspirin: Secondary | ICD-10-CM | POA: Diagnosis not present

## 2017-04-06 DIAGNOSIS — Z9049 Acquired absence of other specified parts of digestive tract: Secondary | ICD-10-CM | POA: Insufficient documentation

## 2017-04-06 DIAGNOSIS — Z90710 Acquired absence of both cervix and uterus: Secondary | ICD-10-CM | POA: Diagnosis not present

## 2017-04-06 DIAGNOSIS — Z8249 Family history of ischemic heart disease and other diseases of the circulatory system: Secondary | ICD-10-CM | POA: Insufficient documentation

## 2017-04-06 DIAGNOSIS — N939 Abnormal uterine and vaginal bleeding, unspecified: Secondary | ICD-10-CM | POA: Diagnosis not present

## 2017-04-06 DIAGNOSIS — Z841 Family history of disorders of kidney and ureter: Secondary | ICD-10-CM | POA: Diagnosis not present

## 2017-04-06 DIAGNOSIS — Z7952 Long term (current) use of systemic steroids: Secondary | ICD-10-CM | POA: Diagnosis not present

## 2017-04-06 DIAGNOSIS — R109 Unspecified abdominal pain: Secondary | ICD-10-CM | POA: Diagnosis present

## 2017-04-06 DIAGNOSIS — M109 Gout, unspecified: Secondary | ICD-10-CM | POA: Insufficient documentation

## 2017-04-06 DIAGNOSIS — R102 Pelvic and perineal pain: Secondary | ICD-10-CM | POA: Insufficient documentation

## 2017-04-06 HISTORY — DX: Gout, unspecified: M10.9

## 2017-04-06 LAB — URINALYSIS, ROUTINE W REFLEX MICROSCOPIC
Bilirubin Urine: NEGATIVE
GLUCOSE, UA: NEGATIVE mg/dL
Hgb urine dipstick: NEGATIVE
KETONES UR: NEGATIVE mg/dL
LEUKOCYTES UA: NEGATIVE
NITRITE: NEGATIVE
PROTEIN: NEGATIVE mg/dL
Specific Gravity, Urine: 1.01 (ref 1.005–1.030)
pH: 6 (ref 5.0–8.0)

## 2017-04-06 NOTE — Discharge Instructions (Signed)
Constipation, Adult °Constipation is when a person: °· Poops (has a bowel movement) fewer times in a week than normal. °· Has a hard time pooping. °· Has poop that is dry, hard, or bigger than normal. ° °Follow these instructions at home: °Eating and drinking ° °· Eat foods that have a lot of fiber, such as: °? Fresh fruits and vegetables. °? Whole grains. °? Beans. °· Eat less of foods that are high in fat, low in fiber, or overly processed, such as: °? French fries. °? Hamburgers. °? Cookies. °? Candy. °? Soda. °· Drink enough fluid to keep your pee (urine) clear or pale yellow. °General instructions °· Exercise regularly or as told by your doctor. °· Go to the restroom when you feel like you need to poop. Do not hold it in. °· Take over-the-counter and prescription medicines only as told by your doctor. These include any fiber supplements. °· Do pelvic floor retraining exercises, such as: °? Doing deep breathing while relaxing your lower belly (abdomen). °? Relaxing your pelvic floor while pooping. °· Watch your condition for any changes. °· Keep all follow-up visits as told by your doctor. This is important. °Contact a doctor if: °· You have pain that gets worse. °· You have a fever. °· You have not pooped for 4 days. °· You throw up (vomit). °· You are not hungry. °· You lose weight. °· You are bleeding from the anus. °· You have thin, pencil-like poop (stool). °Get help right away if: °· You have a fever, and your symptoms suddenly get worse. °· You leak poop or have blood in your poop. °· Your belly feels hard or bigger than normal (is bloated). °· You have very bad belly pain. °· You feel dizzy or you faint. °This information is not intended to replace advice given to you by your health care provider. Make sure you discuss any questions you have with your health care provider. °Document Released: 11/06/2007 Document Revised: 12/08/2015 Document Reviewed: 11/08/2015 °Elsevier Interactive Patient Education ©  2017 [employer] ° °

## 2017-04-06 NOTE — MAU Note (Signed)
Pt is long distance truck driver, had a hysterectomy in 2010.  Had bleeding 3 days this past week, is still cramping & feeling vaginal pressure.

## 2017-04-06 NOTE — MAU Provider Note (Signed)
History     CSN: [id number]  Arrival date and time: 04/06/17 0901   First Provider Initiated Contact with Patient 04/06/17 1339      Chief Complaint  Patient presents with  . Abdominal Pain   HPI Kelsey Morgan is 46 y.o. presents for evaluation of vaginal bleeding X 3 this past week.  She has intermittent lower pelvic cramping and vaginal pressure associated with bleeding.  Hx of hysterectomy -Dr. Sydnee Cabalelcambre, in 2010 for heavy vaginal bleeding and fibroids  One partner, same sex.  Denies constipation, hemorrhoids, UTI sxs.  Does admit to straining with bowel movements. 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   PCP Dr. Michel SanteeVyvyanne Sun.   Past Medical History:  Diagnosis Date  . Back pain   . Bronchitis   . Gout   . Pneumonia     Past Surgical History:  Procedure Laterality Date  . ABDOMINAL HYSTERECTOMY    . ANKLE SURGERY    . CHOLECYSTECTOMY    . FOOT SURGERY    . KNEE SURGERY      Family History  Problem Relation Age of Onset  . Hypertension Mother   . Kidney failure Mother     Social History   Tobacco Use  . Smoking status: Current Every Day Smoker    Packs/day: 0.25    Years: 25.00    Pack years: 6.25    Types: Cigarettes  . Smokeless tobacco: Never Used  Substance Use Topics  . Alcohol use: No  . Drug use: No    Allergies:  Allergies  Allergen Reactions  . Percocet [Oxycodone-Acetaminophen] Itching  . Vicodin [Hydrocodone-Acetaminophen] Itching    Tolerates with Benadryl. Tolerates acetaminophen alone.    Medications Prior to Admission  Medication Sig Dispense Refill Last Dose  . albuterol (PROVENTIL HFA;VENTOLIN HFA) 108 (90 BASE) MCG/ACT inhaler Inhale 1-2 puffs into the lungs every 6 (six) hours as needed for wheezing or shortness of breath. 1 Inhaler 0 six months ago  . aspirin 81 MG chewable tablet Chew 81 mg by mouth daily.   03/27/2017 at Unknown time  . aspirin-acetaminophen-caffeine (EXCEDRIN EXTRA STRENGTH) 250-250-65 MG tablet  Take 2 tablets by mouth every 6 (six) hours as needed for headache or migraine (and pain).   three months ago  . atorvastatin (LIPITOR) 40 MG tablet Take 1 tablet (40 mg total) by mouth daily at 6 PM. 30 tablet 2 Past Month at Unknown time  . fluticasone (FLONASE) 50 MCG/ACT nasal spray Place 2 sprays into both nostrils daily.   03/27/2017 at Unknown time  . Menthol, Topical Analgesic, (BIOFREEZE EX) Apply 1 application topically 2 (two) times daily as needed (pain).   03/28/2017 at Unknown time  . Multiple Vitamin (MULTIVITAMIN WITH MINERALS) TABS tablet Take 1 tablet by mouth daily.   03/27/2017 at Unknown time  . oxyCODONE-acetaminophen (PERCOCET) 5-325 MG tablet Take 1 tablet by mouth every 4 (four) hours as needed for moderate pain. 20 tablet 0   . predniSONE (DELTASONE) 50 MG tablet Take 1 tablet (50 mg total) by mouth daily. 5 tablet 0   . sertraline (ZOLOFT) 100 MG tablet Take 100 mg by mouth at bedtime.    03/27/2017 at Unknown time  . tiZANidine (ZANAFLEX) 4 MG tablet Take 4 mg by mouth at bedtime.   2 03/27/2017 at Unknown time    Review of Systems  Constitutional: Negative for appetite change.  Respiratory: Negative for chest tightness.   Cardiovascular: Negative for chest pain.  Gastrointestinal:  Positive for abdominal pain (cramping).  Genitourinary: Positive for vaginal bleeding and vaginal pain (pressure). Negative for dysuria, flank pain, frequency, hematuria and vaginal discharge.   Physical Exam   Blood pressure 133/73, pulse 64, temperature 97.7 F (36.5 C), temperature source Oral, height 5\' 6"  (1.676 m), weight 286 lb (129.7 kg).  Physical Exam  Nursing note and vitals reviewed. Constitutional: She is oriented to person, place, and time. She appears well-developed and well-nourished. No distress.  HENT:  Head: Normocephalic.  Neck: Normal range of motion.  Cardiovascular: Normal rate.  Respiratory: Effort normal.  GI: Soft. She exhibits no distension and no mass.  There is no tenderness. There is no rebound and no guarding.  Genitourinary: Rectal exam shows no external hemorrhoid, no internal hemorrhoid, no fissure, no mass, no tenderness and anal tone normal. There is no rash, tenderness, lesion or injury on the right labia. There is no rash, tenderness, lesion or injury on the left labia. Right adnexum displays no mass, no tenderness and no fullness. Left adnexum displays no mass, no tenderness and no fullness. No erythema, tenderness or bleeding in the vagina. No foreign body in the vagina. No signs of injury around the vagina. No vaginal discharge found.  Genitourinary Comments: Cervix and uterus absent  Neurological: She is alert and oriented to person, place, and time.  Skin: Skin is warm and dry.  Psychiatric: She has a normal mood and affect. Her behavior is normal. Thought content normal.   Results for orders placed or performed during the hospital encounter of 04/06/17 (from the past 24 hour(s))  Urinalysis, Routine w reflex microscopic     Status: None   Collection Time: 04/06/17  9:46 AM  Result Value Ref Range   Color, Urine YELLOW YELLOW   APPearance CLEAR CLEAR   Specific Gravity, Urine 1.010 1.005 - 1.030   pH 6.0 5.0 - 8.0   Glucose, UA NEGATIVE NEGATIVE mg/dL   Hgb urine dipstick NEGATIVE NEGATIVE   Bilirubin Urine NEGATIVE NEGATIVE   Ketones, ur NEGATIVE NEGATIVE mg/dL   Protein, ur NEGATIVE NEGATIVE mg/dL   Nitrite NEGATIVE NEGATIVE   Leukocytes, UA NEGATIVE NEGATIVE    MAU Course  Procedures  MDM MSE Exam Labs  Assessment and Plan  A; Normal exam      Hx of vaginal bleeding  P: Discussed normal exam findings with patient     Suggested she stay well hydrated, eat fresh fruits, veges, add fiber to diet      and avoid constipation and straining.       Encouraged her call PCP report further bleeding/pressure  Dennison Mascot Leeroy Lovings 04/06/2017, 1:39 PM

## 2017-09-21 ENCOUNTER — Other Ambulatory Visit: Payer: Self-pay

## 2017-09-21 ENCOUNTER — Emergency Department (HOSPITAL_COMMUNITY)
Admission: EM | Admit: 2017-09-21 | Discharge: 2017-09-21 | Disposition: A | Discharge status: Home / Self Care | Payer: 59 | Attending: Emergency Medicine | Admitting: Emergency Medicine

## 2017-09-21 ENCOUNTER — Encounter (HOSPITAL_COMMUNITY): Payer: Self-pay

## 2017-09-21 DIAGNOSIS — R591 Generalized enlarged lymph nodes: Secondary | ICD-10-CM | POA: Insufficient documentation

## 2017-09-21 DIAGNOSIS — Z79899 Other long term (current) drug therapy: Secondary | ICD-10-CM | POA: Diagnosis not present

## 2017-09-21 DIAGNOSIS — Z7982 Long term (current) use of aspirin: Secondary | ICD-10-CM | POA: Diagnosis not present

## 2017-09-21 DIAGNOSIS — F17210 Nicotine dependence, cigarettes, uncomplicated: Secondary | ICD-10-CM | POA: Insufficient documentation

## 2017-09-21 DIAGNOSIS — R519 Headache, unspecified: Secondary | ICD-10-CM

## 2017-09-21 DIAGNOSIS — R51 Headache: Secondary | ICD-10-CM | POA: Diagnosis present

## 2017-09-21 MED ORDER — PROCHLORPERAZINE MALEATE 10 MG PO TABS
10.0000 mg | ORAL_TABLET | Freq: Once | ORAL | Status: AC
  Administered 2017-09-21: 10 mg via ORAL
  Filled 2017-09-21: qty 1

## 2017-09-21 MED ORDER — KETOROLAC TROMETHAMINE 15 MG/ML IJ SOLN
15.0000 mg | Freq: Once | INTRAMUSCULAR | Status: DC
  Filled 2017-09-21: qty 1

## 2017-09-21 MED ORDER — KETOROLAC TROMETHAMINE 15 MG/ML IJ SOLN
15.0000 mg | Freq: Once | INTRAMUSCULAR | Status: AC
  Administered 2017-09-21: 15 mg via INTRAMUSCULAR

## 2017-09-21 MED ORDER — DIPHENHYDRAMINE HCL 25 MG PO CAPS
25.0000 mg | ORAL_CAPSULE | Freq: Once | ORAL | Status: AC
  Administered 2017-09-21: 25 mg via ORAL
  Filled 2017-09-21: qty 1

## 2017-09-21 NOTE — ED Triage Notes (Signed)
Pt went to PCP and was told she had an inflamed lymph node. Lymph node swelling. Pt states that it feels like it is pressing on a nerve and causing sharp pain. Pt having difficulties turn head, sleeping, etc.

## 2017-09-21 NOTE — ED Provider Notes (Signed)
[facility]-EMERGENCY DEPT Provider Note   CSN: [id number] Arrival date & time: 09/21/17  1623     History   Chief Complaint Chief Complaint  Patient presents with  . Neck Pain    HPI Kelsey Morgan is a 47 y.o. female.  HPI   Patient is a 47 year old female who presents the ED today complaining of swollen lymph node to the posterior aspect of her neck that has been intermittently swelling over the last 8 months.  States she told her doctor about this in the past and her primary care physician told her not to worry about it.  She has not followed up since to report intermittent swelling.  States that when lymph node gets swollen she also has headaches to the posterior aspect of her head for the last 6 months.  She denies any vision changes, lightheadedness or dizziness.  No recent fevers, chest pain or shortness of breath.  No abdominal pain, nausea, vomiting, diarrhea.  No numbness or weakness in her arms or legs.  Patient states her pain is 10/10.  She also has pain to the bilateral ears.  No other URI symptoms.  Past Medical History:  Diagnosis Date  . Back pain   . Bronchitis   . Gout   . Pneumonia     Patient Active Problem List   Diagnosis Date Noted  . Chest pain 10/18/2016  . Leucocytosis 10/18/2016    Past Surgical History:  Procedure Laterality Date  . ABDOMINAL HYSTERECTOMY    . ANKLE SURGERY    . CHOLECYSTECTOMY    . FOOT SURGERY    . KNEE SURGERY       OB History   None      Home Medications    Prior to Admission medications   Medication Sig Start Date End Date Taking? Authorizing Provider  albuterol (PROVENTIL HFA;VENTOLIN HFA) 108 (90 BASE) MCG/ACT inhaler Inhale 1-2 puffs into the lungs every 6 (six) hours as needed for wheezing or shortness of breath. 12/03/14   Arby Barrette, MD  aspirin 81 MG chewable tablet Chew 81 mg by mouth daily.    [provider]  atorvastatin (LIPITOR) 40 MG tablet Take 1 tablet (40 mg  total) by mouth daily at 6 PM. Patient not taking: Reported on 04/06/2017 10/19/16   Philip Aspen, Limmie Patricia, MD  fluticasone [facility]) 50 MCG/ACT nasal spray Place 2 sprays into both nostrils daily.    [provider]  Menthol, Topical Analgesic, (BIOFREEZE EX) Apply 1 application topically 2 (two) times daily as needed (pain).    [provider]  Multiple Vitamin (MULTIVITAMIN WITH MINERALS) TABS tablet Take 1 tablet by mouth daily.    [provider]  oxyCODONE-acetaminophen (PERCOCET) 5-325 MG tablet Take 1 tablet by mouth every 4 (four) hours as needed for moderate pain. 03/28/17   Dione Booze, MD  predniSONE (DELTASONE) 50 MG tablet Take 1 tablet (50 mg total) by mouth daily. 03/28/17   Dione Booze, MD  ranitidine (ZANTAC) 150 MG tablet Take 150 mg daily by mouth.    [provider]  sertraline (ZOLOFT) 100 MG tablet Take 100 mg by mouth at bedtime.     [provider]  tiZANidine (ZANAFLEX) 4 MG tablet Take 4 mg by mouth at bedtime.  12/01/15   [provider]    Family History Family History  Problem Relation Age of Onset  . Hypertension Mother   . Kidney failure Mother     Social History  Social History   Tobacco Use  . Smoking status: Current Every Day Smoker    Packs/day: 0.25    Years: 25.00    Pack years: 6.25    Types: Cigarettes  . Smokeless tobacco: Never Used  Substance Use Topics  . Alcohol use: No  . Drug use: No     Allergies   Percocet [oxycodone-acetaminophen] and Vicodin [hydrocodone-acetaminophen]   Review of Systems Review of Systems  Constitutional: Negative for fever.  HENT: Positive for ear pain. Negative for congestion, rhinorrhea, sinus pain and sore throat.   Eyes: Negative for photophobia and visual disturbance.  Respiratory: Negative for shortness of breath.   Cardiovascular: Negative for chest pain.  Gastrointestinal: Negative for abdominal pain, constipation, diarrhea, nausea and  vomiting.  Genitourinary: Negative for frequency.  Musculoskeletal: Positive for back pain and neck pain. Negative for gait problem and neck stiffness.  Neurological: Positive for headaches. Negative for dizziness, weakness, light-headedness and numbness.  Hematological: Positive for adenopathy.     Physical Exam Updated Vital Signs BP (!) 155/72 (BP Location: Right Arm)   Pulse 69   Temp 98 F (36.7 C) (Oral)   Resp 17   Ht 5\' 7"  (1.702 m)   Wt 134.3 kg (296 lb)   SpO2 97%   BMI 46.36 kg/m   Physical Exam  Constitutional: She is oriented to person, place, and time. She appears well-developed and well-nourished. No distress.  Nontoxic-appearing, no acute distress.  HENT:  Head: Normocephalic and atraumatic.  No pharyngeal erythema or tonsillar swelling.  No tonsillar exudates.  Uvula midline.  Bilateral TMs without erythema or effusion.  Eyes: Pupils are equal, round, and reactive to light. Conjunctivae and EOM are normal.  Neck: Normal range of motion. Neck supple.  Mild Left occipital lymphadenopathy.  No cervical midline tenderness.  No nuchal rigidity.  Cardiovascular: Normal rate and regular rhythm.  No murmur heard. Pulmonary/Chest: Effort normal and breath sounds normal. No respiratory distress.  Abdominal: Soft. There is no tenderness.  Musculoskeletal: She exhibits no edema.  Patient has tenderness to bilateral cervical paraspinous muscles as well as bilateral trapezius muscles with muscle spasm.  Neurological: She is alert and oriented to person, place, and time. No cranial nerve deficit.  Mental Status:  Alert, thought content appropriate, able to give a coherent history. Speech fluent without evidence of aphasia. Able to follow 2 step commands without difficulty.  Cranial Nerves:  II:  Peripheral visual fields grossly normal, pupils equal, round, reactive to light III,IV, VI: ptosis not present, extra-ocular motions intact bilaterally  V,VII: smile symmetric,  facial light touch sensation equal VIII: hearing grossly normal to voice  X: uvula elevates symmetrically  XI: bilateral shoulder shrug symmetric and strong XII: midline tongue extension without fassiculations Motor:  Normal tone. 5/5 strength of BUE and BLE major muscle groups including strong and equal grip strength and dorsiflexion/plantar flexion Sensory: light touch normal in all extremities. Cerebellar: normal finger-to-nose with bilateral upper extremities, normal heel to shin bilat Gait: normal gait and balance. CV: 2+ radial and DP/PT pulses  Skin: Skin is warm and dry. Capillary refill takes less than 2 seconds.  Psychiatric: She has a normal mood and affect.  Nursing note and vitals reviewed.   ED Treatments / Results  Labs (all labs ordered are listed, but only abnormal results are displayed) Labs Reviewed - No data to display  EKG None  Radiology No results found.  Procedures Procedures (including critical care time)  Medications Ordered in ED Medications  diphenhydrAMINE (BENADRYL) capsule 25 mg (25 mg Oral Given 09/21/17 1903)  prochlorperazine (COMPAZINE) tablet 10 mg (10 mg Oral Given 09/21/17 1908)  ketorolac (TORADOL) 15 MG/ML injection 15 mg (15 mg Intramuscular Given 09/21/17 1906)     Initial Impression / Assessment and Plan / ED Course  I have reviewed the triage vital signs and the nursing notes.  Pertinent labs & imaging results that were available during my care of the patient were reviewed by me and considered in my medical decision making (see chart for details).   7:15 PM nursing update.  Patient is requesting to go home.  Reevaluated patient.  She states she feels improved after medications and that she wants to go home.   Final Clinical Impressions(s) / ED Diagnoses   Final diagnoses:  Nonintractable headache, unspecified chronicity pattern, unspecified headache type  Lymphadenopathy   Pt HA has been present for the last 6 months.   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  She also sleeps in her truck sometimes sitting up, or "however I can manage to fall asleep ".  Headache was treated and improved while in ED.  Presentation non concerning for [facility], ICH, Meningitis, or temporal arteritis. Pt is afebrile with no focal neuro deficits, nuchal rigidity, or change in vision.  She has an occipital swollen lymph node on the left side which is been present for 8 months.  Advised patient to follow-up with her PCP about this that she may need to have a biopsy completed.  Pt is to follow up with PCP to discuss prophylactic medication.  Strict return precautions for any new or worsening symptoms in the meantime.  Pt verbalizes understanding and is agreeable with plan to dc.  ED Discharge Orders    None       Rayne DuCouture, Mckinley Olheiser S, PA-C 09/21/17 2056    Charlynne PanderYao, David Hsienta, MD 09/21/17 [phone]

## 2017-09-21 NOTE — Discharge Instructions (Signed)
You may continue to take Tylenol and ibuprofen at home for your headaches.  You should follow-up with your primary care doctor about the swollen lymph node on your neck as he may need to have it biopsied.  Please follow-up with primary care in 1 week for reevaluation.  Please return to the ER immediately for any new or worsening symptoms such as fevers associated with headache, dizziness, lightheadedness, change in your mental status, numbness or weakness in your arms or legs, or persistent pain.

## 2017-10-06 ENCOUNTER — Ambulatory Visit: Payer: Self-pay | Admitting: General Surgery

## 2017-10-06 NOTE — H&P (Signed)
History of Present Illness Kelsey Morgan; 10/06/2017 2:32 PM) The patient is a 47 year old female who presents with a complaint of cyst of head. Patient is a 47 year old female who is referred by Deatra James, M.D. for a chief complaint of a posterior midline is had cyst. Patient states that the cyst is been there for approximately 9 months. She states that it's gotten bigger and smaller, and wax and wane in symptoms. She states there is been no erythema or any drainage from the area. She states that she does have associated headaches enthesis gets larger.     Past Surgical History Maurilio Lovely; 10/06/2017 2:07 PM) Foot Surgery  Left. Gallbladder Surgery - Laparoscopic  Hysterectomy (not due to cancer) - Complete  Knee Surgery  Left.  Diagnostic Studies History Maurilio Lovely; 10/06/2017 2:07 PM) Colonoscopy  never Mammogram  within last year Pap Smear  1-5 years ago  Allergies Maurilio Lovely; 10/06/2017 2:08 PM) Vicodin *ANALGESICS - OPIOID*   Medication History Maurilio Lovely; 10/06/2017 2:08 PM) Atorvastatin Calcium (  Tablet, Oral) Active. Amoxicillin (  Tablet, Oral) Active. Sertraline HCl (  Tablet, Oral) Active. Medications Reconciled  Social History Maurilio Lovely; 10/06/2017 2:07 PM) Alcohol use  Occasional alcohol use. Caffeine use  Carbonated beverages, Coffee, Tea. No drug use  Tobacco use  Current every day smoker.  Family History Maurilio Lovely; 10/06/2017 2:07 PM) Arthritis  Mother. Colon Cancer  Family Members In General. Diabetes Mellitus  Sister. Hypertension  Mother. Kidney Disease  Mother. Migraine Headache  Sister.  Pregnancy / Birth History Maurilio Lovely; 10/06/2017 2:07 PM) Age at menarche  11 years. Contraceptive History  Oral contraceptives. Gravida  0 Irregular periods  Para  0  Other Problems Maurilio Lovely; 10/06/2017 2:07 PM) Back Pain  Gastric Ulcer  Gastroesophageal Reflux Disease  Hemorrhoids   Hypercholesterolemia     Review of Systems Kelsey Morgan; 10/06/2017 2:29 PM) General Not Present- Appetite Loss, Chills, Fatigue, Fever, Night Sweats, Weight Gain and Weight Loss. HEENT Present- Seasonal Allergies, Sinus Pain and Wears glasses/contact lenses. Not Present- Earache, Hearing Loss, Hoarseness, Nose Bleed, Oral Ulcers, Ringing in the Ears, Sore Throat, Visual Disturbances and Yellow Eyes. Respiratory Not Present- Bloody sputum, Chronic Cough, Difficulty Breathing, Snoring and Wheezing. Breast Not Present- Breast Mass, Breast Pain, Nipple Discharge and Skin Changes. Cardiovascular Not Present- Chest Pain, Difficulty Breathing Lying Down, Leg Cramps, Palpitations, Rapid Heart Rate, Shortness of Breath and Swelling of Extremities. Gastrointestinal Present- Hemorrhoids. Not Present- Abdominal Pain, Bloating, Bloody Stool, Change in Bowel Habits, Chronic diarrhea, Constipation, Difficulty Swallowing, Excessive gas, Gets full quickly at meals, Indigestion, Nausea, Rectal Pain and Vomiting. Female Genitourinary Not Present- Frequency, Nocturia, Painful Urination, Pelvic Pain and Urgency. Musculoskeletal Present- Back Pain. Not Present- Joint Pain, Joint Stiffness, Muscle Pain, Muscle Weakness and Swelling of Extremities. Neurological Present- Headaches. Not Present- Decreased Memory, Fainting, Numbness, Seizures, Tingling, Tremor, Trouble walking and Weakness. Psychiatric Present- Change in Sleep Pattern. Not Present- Anxiety, Bipolar, Depression, Fearful and Frequent crying. Endocrine Not Present- Cold Intolerance, Excessive Hunger, Hair Changes, Heat Intolerance, Hot flashes and New Diabetes. Hematology Not Present- Blood Thinners, Easy Bruising, Excessive bleeding, Gland problems, HIV and Persistent Infections. All other systems negative  Vitals Maurilio Lovely; 10/06/2017 2:09 PM) 10/06/2017 2:08 PM Weight: 290.25 lb Height: 66in Body Surface Area: 2.34 m Body Mass Index:  46.85 kg/m  Temp.: 42F(Oral)  Pulse: 77 (Regular)  BP: 130/82 (Sitting, Left Arm, Standard)       Physical Exam Kelsey Morgan; 10/06/2017 2:32 PM) The  physical exam findings are as follows: Note:Constitutional: No acute distress, conversant, appears stated age  Eyes: Anicteric sclerae, moist conjunctiva, no lid lag  Neck: No thyromegaly, trachea midline, no cervical lymphadenopathy, posterior midline cyst approximately 2 x 2 centimeters in size, subcutaneous, small, mobile  Lungs: Clear to auscultation biilaterally, normal respiratory effot  Cardiovascular: regular rate & rhythm, no murmurs, no peripheal edema, pedal pulses 2+  GI: Soft, no masses or hepatosplenomegaly, non-tender to palpation  MSK: Normal gait, no clubbing cyanosis, edema  Skin: No rashes, palpation reveals normal skin turgor  Psychiatric: Appropriate judgment and insight, oriented to person, place, and time    Assessment & Plan Kelsey Morgan; 10/06/2017 2:33 PM) CYST OF SKIN (L72.9) Impression: 47 year old female with a posterior midline occipital cyst. 1. The patient like to proceed to the operating room for excision of cyst 2. I discussed with her the risks and benefits of the procedure to include but not limited to: Infection, bleeding, damage to surrounding structures, possible recurrence. Patient was understanding and wished to proceed.

## 2017-10-31 NOTE — Pre-Procedure Instructions (Signed)
Kelsey Morgan  10/31/2017      [facility] [phone] - [place] - [address] AT [facility] [address] & [address] [place] [id number] Phone: [phone] Fax: [phone]    Your procedure is scheduled on June 12  [facility] at 0830 A.M.  Call this number if you have problems the morning of surgery:  [phone]   Remember:  NOTHING TO EAT OR DRINK AFTER MIDNIGHT    Take these medicines the morning of surgery with A SIP OF WATER  fexofenadine (ALLEGRA)  ranitidine (ZANTAC)  7 days prior to surgery STOP taking any Aspirin(unless otherwise instructed by your surgeon), Aleve, Naproxen, Ibuprofen, Motrin, Advil, Goody's, BC's, all herbal medications, fish oil, and all vitamins    Do not wear jewelry, make-up or nail polish.  Do not wear lotions, powders, or perfumes, or deodorant.  Do not shave 48 hours prior to surgery.   Do not bring valuables to the hospital.  [facility] is not responsible for any belongings or valuables.  Contacts, dentures or bridgework may not be worn into surgery.  Leave your suitcase in the car.  After surgery it may be brought to your room.  For patients admitted to the hospital, discharge time will be determined by your treatment team.  Patients discharged the day of surgery will not be allowed to drive home.    Special instructions:   [facility]- Preparing For Surgery  Before surgery, you can play an important role. Because skin is not sterile, your skin needs to be as free of germs as possible. You can reduce the number of germs on your skin by washing with CHG (chlorahexidine gluconate) Soap before surgery.  CHG is an antiseptic cleaner which kills germs and bonds with the skin to continue killing germs even after washing.    Oral Hygiene is also important to reduce your risk of infection.  Remember - BRUSH YOUR TEETH THE MORNING OF SURGERY WITH YOUR REGULAR  TOOTHPASTE  Please do not use if you have an allergy to CHG or antibacterial soaps. If your skin becomes reddened/irritated stop using the CHG.  Do not shave (including legs and underarms) for at least 48 hours prior to first CHG shower. It is OK to shave your face.  Please follow these instructions carefully.   1. Shower the NIGHT BEFORE SURGERY and the MORNING OF SURGERY with CHG.   2. If you chose to wash your hair, wash your hair first as usual with your normal shampoo.  3. After you shampoo, rinse your hair and body thoroughly to remove the shampoo.  4. Use CHG as you would any other liquid soap. You can apply CHG directly to the skin and wash gently with a scrungie or a clean washcloth.   5. Apply the CHG Soap to your body ONLY FROM THE NECK DOWN.  Do not use on open wounds or open sores. Avoid contact with your eyes, ears, mouth and genitals (private parts). Wash Face and genitals (private parts)  with your normal soap.  6. Wash thoroughly, paying special attention to the area where your surgery will be performed.  7. Thoroughly rinse your body with warm water from the neck down.  8. DO NOT shower/wash with your normal soap after using and rinsing off the CHG Soap.  9. Pat yourself dry with a CLEAN TOWEL.  10. Wear CLEAN PAJAMAS to bed the night before  surgery, wear comfortable clothes the morning of surgery  11. Place CLEAN SHEETS on your bed the night of your first shower and DO NOT SLEEP WITH PETS.    Day of Surgery:  Do not apply any deodorants/lotions.  Please wear clean clothes to the hospital/surgery center.   Remember to brush your teeth WITH YOUR REGULAR TOOTHPASTE.    Please read over the following fact sheets that you were given.

## 2017-11-03 ENCOUNTER — Encounter (HOSPITAL_COMMUNITY): Payer: Self-pay

## 2017-11-03 ENCOUNTER — Other Ambulatory Visit: Payer: Self-pay

## 2017-11-03 ENCOUNTER — Encounter (HOSPITAL_COMMUNITY)
Admission: RE | Admit: 2017-11-03 | Discharge: 2017-11-03 | Disposition: A | Discharge status: Home / Self Care | Payer: Managed Care, Other (non HMO) | Source: Ambulatory Visit | Attending: General Surgery | Admitting: General Surgery

## 2017-11-03 DIAGNOSIS — Z01818 Encounter for other preprocedural examination: Secondary | ICD-10-CM | POA: Diagnosis not present

## 2017-11-03 DIAGNOSIS — R221 Localized swelling, mass and lump, neck: Secondary | ICD-10-CM | POA: Insufficient documentation

## 2017-11-03 HISTORY — DX: Unspecified osteoarthritis, unspecified site: M19.90

## 2017-11-03 LAB — CBC
HCT: 43.6 % (ref 36.0–46.0)
HEMOGLOBIN: 13.7 g/dL (ref 12.0–15.0)
MCH: 26.8 pg (ref 26.0–34.0)
MCHC: 31.4 g/dL (ref 30.0–36.0)
MCV: 85.3 fL (ref 78.0–100.0)
Platelets: 326 10*3/uL (ref 150–400)
RBC: 5.11 MIL/uL (ref 3.87–5.11)
RDW: 15.1 % (ref 11.5–15.5)
WBC: 11.9 10*3/uL — ABNORMAL HIGH (ref 4.0–10.5)

## 2017-11-03 LAB — BASIC METABOLIC PANEL
ANION GAP: 9 (ref 5–15)
BUN: 8 mg/dL (ref 6–20)
CALCIUM: 8.9 mg/dL (ref 8.9–10.3)
CHLORIDE: 111 mmol/L (ref 101–111)
CO2: 21 mmol/L — ABNORMAL LOW (ref 22–32)
CREATININE: 0.93 mg/dL (ref 0.44–1.00)
GFR calc non Af Amer: >60 mL/min (ref >60)
GLUCOSE: 115 mg/dL — AB (ref 65–99)
Potassium: 3.5 mmol/L (ref 3.5–5.1)
Sodium: 141 mmol/L (ref 135–145)

## 2017-11-03 MED ORDER — CHLORHEXIDINE GLUCONATE CLOTH 2 % EX PADS
6.0000 | MEDICATED_PAD | Freq: Once | CUTANEOUS | Status: DC
Start: 2017-11-03 — End: 2017-11-04

## 2017-11-03 MED ORDER — CHLORHEXIDINE GLUCONATE CLOTH 2 % EX PADS: 6.0000 | MEDICATED_PAD | Freq: Once | CUTANEOUS | Status: DC

## 2017-11-03 NOTE — Progress Notes (Signed)
PCP: Deatra JamesVyvyan Sun, MD  Cardiologist: pt denies  EKG: 11/03/17  Stress test:10/31/16 in EPIC  ECHO: 10/18/16 in EPIC  Cardiac Cath: pt denies  Chest x-ray: pt denies past year, no recent respiratory infection/complication

## 2017-11-11 MED ORDER — DEXTROSE 5 % IV SOLN
3.0000 g | INTRAVENOUS | Status: AC
  Administered 2017-11-12: 3 g via INTRAVENOUS
  Filled 2017-11-11: qty 3

## 2017-11-12 ENCOUNTER — Ambulatory Visit (HOSPITAL_COMMUNITY): Payer: Managed Care, Other (non HMO) | Admitting: Critical Care Medicine

## 2017-11-12 ENCOUNTER — Encounter (HOSPITAL_COMMUNITY): Payer: Self-pay

## 2017-11-12 ENCOUNTER — Encounter (HOSPITAL_COMMUNITY)
Admission: RE | Disposition: A | Discharge status: Home / Self Care | Payer: Self-pay | Source: Ambulatory Visit | Attending: General Surgery

## 2017-11-12 ENCOUNTER — Ambulatory Visit (HOSPITAL_COMMUNITY)
Admission: RE | Admit: 2017-11-12 | Discharge: 2017-11-12 | Disposition: A | Discharge status: Home / Self Care | Payer: Managed Care, Other (non HMO) | Source: Ambulatory Visit | Attending: General Surgery | Admitting: General Surgery

## 2017-11-12 DIAGNOSIS — E7800 Pure hypercholesterolemia, unspecified: Secondary | ICD-10-CM | POA: Diagnosis not present

## 2017-11-12 DIAGNOSIS — R221 Localized swelling, mass and lump, neck: Secondary | ICD-10-CM | POA: Diagnosis present

## 2017-11-12 DIAGNOSIS — L729 Follicular cyst of the skin and subcutaneous tissue, unspecified: Secondary | ICD-10-CM | POA: Insufficient documentation

## 2017-11-12 DIAGNOSIS — F17200 Nicotine dependence, unspecified, uncomplicated: Secondary | ICD-10-CM | POA: Diagnosis not present

## 2017-11-12 DIAGNOSIS — Z419 Encounter for procedure for purposes other than remedying health state, unspecified: Secondary | ICD-10-CM

## 2017-11-12 HISTORY — PX: EXCISION MASS NECK: SHX6703

## 2017-11-12 SURGERY — EXCISION, MASS, NECK
Anesthesia: General | Site: Neck

## 2017-11-12 MED ORDER — ROCURONIUM BROMIDE 10 MG/ML (PF) SYRINGE
PREFILLED_SYRINGE | INTRAVENOUS | Status: AC
  Filled 2017-11-12: qty 5

## 2017-11-12 MED ORDER — LACTATED RINGERS IV SOLN
INTRAVENOUS | Status: DC
  Administered 2017-11-12: 09:00:00 via INTRAVENOUS

## 2017-11-12 MED ORDER — MIDAZOLAM HCL 2 MG/2ML IJ SOLN
INTRAMUSCULAR | Status: AC
  Filled 2017-11-12: qty 2

## 2017-11-12 MED ORDER — DEXAMETHASONE SODIUM PHOSPHATE 10 MG/ML IJ SOLN
INTRAMUSCULAR | Status: DC | PRN
  Administered 2017-11-12: 10 mg via INTRAVENOUS

## 2017-11-12 MED ORDER — FENTANYL CITRATE (PF) 250 MCG/5ML IJ SOLN
INTRAMUSCULAR | Status: DC | PRN
  Administered 2017-11-12 (×5): 50 ug via INTRAVENOUS

## 2017-11-12 MED ORDER — SUGAMMADEX SODIUM 200 MG/2ML IV SOLN
INTRAVENOUS | Status: DC | PRN
  Administered 2017-11-12: 529.2 mg via INTRAVENOUS

## 2017-11-12 MED ORDER — MIDAZOLAM HCL 5 MG/5ML IJ SOLN
INTRAMUSCULAR | Status: DC | PRN
  Administered 2017-11-12: 2 mg via INTRAVENOUS

## 2017-11-12 MED ORDER — DEXAMETHASONE SODIUM PHOSPHATE 10 MG/ML IJ SOLN
INTRAMUSCULAR | Status: AC
  Filled 2017-11-12: qty 1

## 2017-11-12 MED ORDER — FENTANYL CITRATE (PF) 100 MCG/2ML IJ SOLN: 25.0000 ug | INTRAMUSCULAR | Status: DC | PRN

## 2017-11-12 MED ORDER — BUPIVACAINE-EPINEPHRINE (PF) 0.25% -1:200000 IJ SOLN
INTRAMUSCULAR | Status: AC
  Filled 2017-11-12: qty 30

## 2017-11-12 MED ORDER — ONDANSETRON HCL 4 MG/2ML IJ SOLN
INTRAMUSCULAR | Status: AC
  Filled 2017-11-12: qty 2

## 2017-11-12 MED ORDER — PROPOFOL 10 MG/ML IV BOLUS
INTRAVENOUS | Status: AC
  Filled 2017-11-12: qty 40

## 2017-11-12 MED ORDER — PROPOFOL 10 MG/ML IV BOLUS
INTRAVENOUS | Status: DC | PRN
  Administered 2017-11-12: 200 mg via INTRAVENOUS

## 2017-11-12 MED ORDER — PHENYLEPHRINE 40 MCG/ML (10ML) SYRINGE FOR IV PUSH (FOR BLOOD PRESSURE SUPPORT)
PREFILLED_SYRINGE | INTRAVENOUS | Status: AC
  Filled 2017-11-12: qty 10

## 2017-11-12 MED ORDER — FENTANYL CITRATE (PF) 250 MCG/5ML IJ SOLN
INTRAMUSCULAR | Status: AC
  Filled 2017-11-12: qty 5

## 2017-11-12 MED ORDER — ROCURONIUM BROMIDE 10 MG/ML (PF) SYRINGE
PREFILLED_SYRINGE | INTRAVENOUS | Status: DC | PRN
  Administered 2017-11-12: 70 mg via INTRAVENOUS

## 2017-11-12 MED ORDER — 0.9 % SODIUM CHLORIDE (POUR BTL) OPTIME
TOPICAL | Status: DC | PRN
  Administered 2017-11-12: 1000 mL

## 2017-11-12 MED ORDER — BUPIVACAINE-EPINEPHRINE 0.25% -1:200000 IJ SOLN
INTRAMUSCULAR | Status: DC | PRN
  Administered 2017-11-12: 10 mL

## 2017-11-12 MED ORDER — SUGAMMADEX SODIUM 200 MG/2ML IV SOLN
INTRAVENOUS | Status: AC
  Filled 2017-11-12: qty 2

## 2017-11-12 MED ORDER — SUGAMMADEX SODIUM 500 MG/5ML IV SOLN
INTRAVENOUS | Status: AC
  Filled 2017-11-12: qty 5

## 2017-11-12 MED ORDER — ONDANSETRON HCL 4 MG/2ML IJ SOLN
INTRAMUSCULAR | Status: DC | PRN
  Administered 2017-11-12: 4 mg via INTRAVENOUS

## 2017-11-12 MED ORDER — TRAMADOL HCL 50 MG PO TABS: 50.0000 mg | ORAL_TABLET | Freq: Four times a day (QID) | ORAL | 0 refills | Status: DC | PRN

## 2017-11-12 SURGICAL SUPPLY — 49 items
ADH SKN CLS APL DERMABOND .7 (GAUZE/BANDAGES/DRESSINGS) ×1
APL SKNCLS STERI-STRIP NONHPOA (GAUZE/BANDAGES/DRESSINGS) ×1
BENZOIN TINCTURE PRP APPL 2/3 (GAUZE/BANDAGES/DRESSINGS) ×3 IMPLANT
BLADE CLIPPER SURG (BLADE) IMPLANT
BLADE SURG 10 STRL SS (BLADE) ×2 IMPLANT
BLADE SURG 15 STRL LF DISP TIS (BLADE) IMPLANT
BLADE SURG 15 STRL SS (BLADE) ×3
CANISTER SUCT 3000ML PPV (MISCELLANEOUS) IMPLANT
CHLORAPREP W/TINT 26ML (MISCELLANEOUS) ×3 IMPLANT
CLOSURE WOUND 1/2 X4 (GAUZE/BANDAGES/DRESSINGS) ×1
COVER SURGICAL LIGHT HANDLE (MISCELLANEOUS) ×3 IMPLANT
DERMABOND ADVANCED (GAUZE/BANDAGES/DRESSINGS) ×2
DERMABOND ADVANCED .7 DNX12 (GAUZE/BANDAGES/DRESSINGS) IMPLANT
DRAPE LAPAROTOMY 100X72 PEDS (DRAPES) IMPLANT
DRAPE ORTHO SPLIT 77X108 STRL (DRAPES)
DRAPE SURG ORHT 6 SPLT 77X108 (DRAPES) IMPLANT
DRAPE UTILITY 15X26 TOWEL STRL (DRAPES) ×4 IMPLANT
ELECT CAUTERY BLADE 6.4 (BLADE) ×3 IMPLANT
ELECT REM PT RETURN 9FT ADLT (ELECTROSURGICAL) ×3
ELECTRODE REM PT RTRN 9FT ADLT (ELECTROSURGICAL) ×1 IMPLANT
GAUZE SPONGE 4X4 12PLY STRL (GAUZE/BANDAGES/DRESSINGS) ×3 IMPLANT
GLOVE BIO SURGEON STRL SZ7.5 (GLOVE) ×3 IMPLANT
GLOVE BIOGEL PI IND STRL 8 (GLOVE) ×1 IMPLANT
GLOVE BIOGEL PI INDICATOR 8 (GLOVE) ×2
GOWN STRL REUS W/ TWL LRG LVL3 (GOWN DISPOSABLE) ×1 IMPLANT
GOWN STRL REUS W/ TWL XL LVL3 (GOWN DISPOSABLE) ×1 IMPLANT
GOWN STRL REUS W/TWL LRG LVL3 (GOWN DISPOSABLE) ×3
GOWN STRL REUS W/TWL XL LVL3 (GOWN DISPOSABLE) ×3
KIT BASIN OR (CUSTOM PROCEDURE TRAY) ×3 IMPLANT
KIT TURNOVER KIT B (KITS) ×3 IMPLANT
NDL HYPO 25GX1X1/2 BEV (NEEDLE) ×1 IMPLANT
NEEDLE HYPO 25GX1X1/2 BEV (NEEDLE) ×3 IMPLANT
NS IRRIG 1000ML POUR BTL (IV SOLUTION) ×3 IMPLANT
PACK SURGICAL SETUP 50X90 (CUSTOM PROCEDURE TRAY) ×3 IMPLANT
PAD ARMBOARD 7.5X6 YLW CONV (MISCELLANEOUS) ×3 IMPLANT
PENCIL BUTTON HOLSTER BLD 10FT (ELECTRODE) ×3 IMPLANT
SPECIMEN JAR SMALL (MISCELLANEOUS) ×3 IMPLANT
SPONGE LAP 18X18 X RAY DECT (DISPOSABLE) ×3 IMPLANT
STRIP CLOSURE SKIN 1/2X4 (GAUZE/BANDAGES/DRESSINGS) ×2 IMPLANT
SUT MNCRL AB 4-0 PS2 18 (SUTURE) ×3 IMPLANT
SUT VIC AB 3-0 SH 18 (SUTURE) ×3 IMPLANT
SYR BULB 3OZ (MISCELLANEOUS) ×3 IMPLANT
SYR CONTROL 10ML LL (SYRINGE) ×3 IMPLANT
TOWEL OR 17X24 6PK STRL BLUE (TOWEL DISPOSABLE) ×3 IMPLANT
TOWEL OR 17X26 10 PK STRL BLUE (TOWEL DISPOSABLE) ×3 IMPLANT
TUBE CONNECTING 12'X1/4 (SUCTIONS)
TUBE CONNECTING 12X1/4 (SUCTIONS) IMPLANT
UNDERPAD 30X30 (UNDERPADS AND DIAPERS) IMPLANT
YANKAUER SUCT BULB TIP NO VENT (SUCTIONS) IMPLANT

## 2017-11-12 NOTE — H&P (Signed)
History of Present Illness  The patient is a 47 year old female who presents with a complaint of cyst of head. Patient is a 47 year old female who is referred by Deatra James, M.D. for a chief complaint of a posterior midline is had cyst. Patient states that the cyst is been there for approximately 9 months. She states that it's gotten bigger and smaller, and wax and wane in symptoms. She states there is been no erythema or any drainage from the area. She states that she does have associated headaches enthesis gets larger.     Past Surgical History Foot Surgery  Left. Gallbladder Surgery - Laparoscopic  Hysterectomy (not due to cancer) - Complete  Knee Surgery  Left.  Diagnostic Studies History  Colonoscopy  never Mammogram  within last year Pap Smear  1-5 years ago  Allergies Vicodin *ANALGESICS - OPIOID*   Medication History  Atorvastatin Calcium (40MG  Tablet, Oral) Active. Amoxicillin (875MG  Tablet, Oral) Active. Sertraline HCl (100MG  Tablet, Oral) Active. Medications Reconciled  Social History  Alcohol use  Occasional alcohol use. Caffeine use  Carbonated beverages, Coffee, Tea. No drug use  Tobacco use  Current every day smoker.  Family History Arthritis  Mother. Colon Cancer  Family Members In General. Diabetes Mellitus  Sister. Hypertension  Mother. Kidney Disease  Mother. Migraine Headache  Sister.  Pregnancy / Birth History  Age at menarche  11 years. Contraceptive History  Oral contraceptives. Gravida  0 Irregular periods  Para  0  Other Problems  Back Pain  Gastric Ulcer  Gastroesophageal Reflux Disease  Hemorrhoids  Hypercholesterolemia     Review of Systems  General Not Present- Appetite Loss, Chills, Fatigue, Fever, Night Sweats, Weight Gain and Weight Loss. HEENT Present- Seasonal Allergies, Sinus Pain and Wears glasses/contact lenses. Not Present- Earache, Hearing Loss, Hoarseness, Nose Bleed,  Oral Ulcers, Ringing in the Ears, Sore Throat, Visual Disturbances and Yellow Eyes. Respiratory Not Present- Bloody sputum, Chronic Cough, Difficulty Breathing, Snoring and Wheezing. Breast Not Present- Breast Mass, Breast Pain, Nipple Discharge and Skin Changes. Cardiovascular Not Present- Chest Pain, Difficulty Breathing Lying Down, Leg Cramps, Palpitations, Rapid Heart Rate, Shortness of Breath and Swelling of Extremities. Gastrointestinal Present- Hemorrhoids. Not Present- Abdominal Pain, Bloating, Bloody Stool, Change in Bowel Habits, Chronic diarrhea, Constipation, Difficulty Swallowing, Excessive gas, Gets full quickly at meals, Indigestion, Nausea, Rectal Pain and Vomiting. Female Genitourinary Not Present- Frequency, Nocturia, Painful Urination, Pelvic Pain and Urgency. Musculoskeletal Present- Back Pain. Not Present- Joint Pain, Joint Stiffness, Muscle Pain, Muscle Weakness and Swelling of Extremities. Neurological Present- Headaches. Not Present- Decreased Memory, Fainting, Numbness, Seizures, Tingling, Tremor, Trouble walking and Weakness. Psychiatric Present- Change in Sleep Pattern. Not Present- Anxiety, Bipolar, Depression, Fearful and Frequent crying. Endocrine Not Present- Cold Intolerance, Excessive Hunger, Hair Changes, Heat Intolerance, Hot flashes and New Diabetes. Hematology Not Present- Blood Thinners, Easy Bruising, Excessive bleeding, Gland problems, HIV and Persistent Infections. All other systems negative  BP 109/72   Pulse 65   Temp 98.7 F (37.1 C) (Oral)   Resp 20   Ht 5\' 6"  (1.676 m)   Wt 132.3 kg (291 lb 11.2 oz)   SpO2 98%   BMI 47.08 kg/m      Physical Exam The physical exam findings are as follows: Note:Constitutional: No acute distress, conversant, appears stated age  Eyes: Anicteric sclerae, moist conjunctiva, no lid lag  Neck: No thyromegaly, trachea midline, no cervical lymphadenopathy, posterior midline cyst approximately 2 x 2  centimeters in size, subcutaneous, small, mobile  Lungs:  Clear to auscultation biilaterally, normal respiratory effot  Cardiovascular: regular rate & rhythm, no murmurs, no peripheal edema, pedal pulses 2+  GI: Soft, no masses or hepatosplenomegaly, non-tender to palpation  MSK: Normal gait, no clubbing cyanosis, edema  Skin: No rashes, palpation reveals normal skin turgor  Psychiatric: Appropriate judgment and insight, oriented to person, place, and time    Assessment & Plan ( CYST OF SKIN (L72.9) Impression: 47 year old female with a posterior midline occipital cyst. 1. The patient like to proceed to the operating room for excision of cyst 2. I discussed with her the risks and benefits of the procedure to include but not limited to: Infection, bleeding, damage to surrounding structures, possible recurrence. Patient was understanding and wished to proceed.

## 2017-11-12 NOTE — Anesthesia Postprocedure Evaluation (Signed)
Anesthesia Post Note  Patient: Christen Bamehyllis M Mcclintic  Procedure(s) Performed: EXCISION POSTERIOR  NECK CYST (N/A Neck)     Patient location during evaluation: PACU Anesthesia Type: General Level of consciousness: awake and alert Pain management: pain level controlled Vital Signs Assessment: post-procedure vital signs reviewed and stable Respiratory status: spontaneous breathing, nonlabored ventilation, respiratory function stable and patient connected to nasal cannula oxygen Cardiovascular status: blood pressure returned to baseline and stable Postop Assessment: no apparent nausea or vomiting Anesthetic complications: no    Last Vitals:  Vitals:   11/12/17 1151 11/12/17 1206  BP: 116/72 127/80  Pulse: 76 74  Resp: 13 14  Temp: (!) 36.3 C   SpO2: 96% 93%    Last Pain:  Vitals:   11/12/17 1206  TempSrc:   PainSc: 0-No pain                 Jerick Khachatryan

## 2017-11-12 NOTE — Discharge Instructions (Signed)
Lipoma Removal, Care After Refer to this sheet in the next few weeks. These instructions provide you with information about caring for yourself after your procedure. Your health care provider may also give you more specific instructions. Your treatment has been planned according to current medical practices, but problems sometimes occur. Call your health care provider if you have any problems or questions after your procedure. What can I expect after the procedure? After the procedure, it is common to have:  Mild pain.  Swelling.  Bruising.  Follow these instructions at home:  Bathing    Keep your bandage (dressing) dry until your health care provider says it can be removed. Incision care   Follow instructions from your health care provider about how to take care of your incision. Make sure you: ? Wash your hands with soap and water before you change your bandage (dressing). If soap and water are not available, use hand sanitizer. ? Change your dressing as told by your health care provider. ? Leave stitches (sutures), skin glue, or adhesive strips in place. These skin closures may need to stay in place for 2 weeks or longer. If adhesive strip edges start to loosen and curl up, you may trim the loose edges. Do not remove adhesive strips completely unless your health care provider tells you to do that.  Check your incision area every day for signs of infection. Check for: ? More redness, swelling, or pain. ? Fluid or blood. ? Warmth. ? Pus or a bad smell. Driving  Do not drive or operate heavy machinery while taking prescription pain medicine.  Do not drive for 24 hours if you received a medicine to help you relax (sedative) during your procedure.  Ask your health care provider when it is safe for you to drive. General instructions  Take over-the-counter and prescription medicines only as told by your health care provider.  Do not use any tobacco products, such as cigarettes,  chewing tobacco, and e-cigarettes. These can delay healing. If you need help quitting, ask your health care provider.  Return to your normal activities as told by your health care provider. Ask your health care provider what activities are safe for you.  Keep all follow-up visits as told by your health care provider. This is important. Contact a health care provider if:  You have more redness, swelling, or pain around your incision.  You have fluid or blood coming from your incision.  Your incision feels warm to the touch.  You have pus or a bad smell coming from your incision.  You have pain that does not get better with medicine. Get help right away if:  You have chills or a fever.  You have severe pain. This information is not intended to replace advice given to you by your health care provider. Make sure you discuss any questions you have with your health care provider. Document Released: 08/03/2015 Document Revised: 10/31/2015 Document Reviewed: 08/03/2015 Elsevier Interactive Patient Education  2018 [employer]

## 2017-11-12 NOTE — Transfer of Care (Signed)
Immediate Anesthesia Transfer of Care Note  Patient: Kelsey Morgan  Procedure(s) Performed: EXCISION POSTERIOR  NECK CYST (N/A Neck)  Patient Location: PACU  Anesthesia Type:General  Level of Consciousness: awake, alert , drowsy and patient cooperative  Airway & Oxygen Therapy: Patient Spontanous Breathing and Patient connected to nasal cannula oxygen  Post-op Assessment: Report given to RN and Post -op Vital signs reviewed and stable  Post vital signs: Reviewed and stable  Last Vitals:  Vitals Value Taken Time  BP 140/85 11/12/2017 11:07 AM  Temp 36.3 C 11/12/2017 11:07 AM  Pulse 79 11/12/2017 11:10 AM  Resp 19 11/12/2017 11:10 AM  SpO2 96 % 11/12/2017 11:10 AM  Vitals shown include unvalidated device data.  Last Pain:  Vitals:   11/12/17 0850  TempSrc: Oral  PainSc:       Patients Stated Pain Goal: 4 (11/12/17 0844)  Complications: No apparent anesthesia complications

## 2017-11-12 NOTE — Op Note (Signed)
11/12/2017  10:48 AM  PATIENT:  Kelsey Morgan  47 y.o. female  PRE-OPERATIVE DIAGNOSIS:  posterior neck mass  POST-OPERATIVE DIAGNOSIS:  same  PROCEDURE:  Procedure(s): EXCISION POSTERIOR  NECK CYST 2x3cm SQ (N/A)  SURGEON:  Surgeon(s) and Role:    Axel Filler* Remi Rester, MD - Primary  ANESTHESIA:   local and general  EBL:  10 mL   BLOOD ADMINISTERED:none  DRAINS: none   LOCAL MEDICATIONS USED:  BUPIVICAINE   SPECIMEN:  Source of Specimen:  Posterior neck cyst  DISPOSITION OF SPECIMEN:  PATHOLOGY  COUNTS:  YES  TOURNIQUET:  * No tourniquets in log *  DICTATION: .Dragon Dictation After the patient was consented she was taken to OR and placed in supine position with bilateral SCDs in place. She underwent GETA anesthesia. She was in place in the prone position. Patient was prepped and draped in standard fashion. Timeout was called all facts verified.  Quarter percent Marcaine with epinephrine was then used to create a field block around the cyst.  A 3cm  incision was made over the area of the mass. This was dissected down sharply and circumferentially down to healthy fat tissue. The mass appeared to be fatty in nature and was excised. This was sent to pathology. Cautery was used to maintain hemostasis. At this time 3-0 Vicryl used to reapproximate the deep dermal layers. 4-0 Monocryl was used to reapproximate the skin in a subcuticular fashion. The skin was dressed with Dermabond. The patient tied the procedure well was taken to recovery room stable condition.   PLAN OF CARE: Discharge to home after PACU  PATIENT DISPOSITION:  PACU - hemodynamically stable.   Delay start of Pharmacological VTE agent (>24hrs) due to surgical blood loss or risk of bleeding: not applicable

## 2017-11-12 NOTE — Anesthesia Procedure Notes (Signed)
Procedure Name: Intubation Date/Time: 11/12/2017 10:17 AM Performed by: Adria Dillonkin, Demmi Sindt A, CRNA Pre-anesthesia Checklist: Patient identified, Emergency Drugs available, Suction available and Patient being monitored Patient Re-evaluated:Patient Re-evaluated prior to induction Oxygen Delivery Method: Circle system utilized Preoxygenation: Pre-oxygenation with 100% oxygen Induction Type: IV induction Ventilation: Mask ventilation without difficulty Laryngoscope Size: Miller and 3 Grade View: Grade II Tube type: Oral Tube size: 7.0 mm Number of attempts: 1 Airway Equipment and Method: Stylet Placement Confirmation: ETT inserted through vocal cords under direct vision,  positive ETCO2,  CO2 detector and breath sounds checked- equal and bilateral Secured at: 21 cm Tube secured with: Tape Dental Injury: Teeth and Oropharynx as per pre-operative assessment

## 2017-11-12 NOTE — Anesthesia Preprocedure Evaluation (Addendum)
Anesthesia Evaluation  Patient identified by MRN, date of birth, ID band Patient awake    Reviewed: Allergy & Precautions, NPO status , Patient's Chart, lab work & pertinent test results  History of Anesthesia Complications Negative for: history of anesthetic complications  Airway Mallampati: III  TM Distance: >3 FB Neck ROM: Full    Dental  (+) Teeth Intact   Pulmonary neg shortness of breath, neg sleep apnea, neg COPD, neg recent URI, Current Smoker,    breath sounds clear to auscultation       Cardiovascular negative cardio ROS   Rhythm:Regular     Neuro/Psych negative neurological ROS  negative psych ROS   GI/Hepatic negative GI ROS, Neg liver ROS,   Endo/Other  negative endocrine ROS  Renal/GU negative Renal ROS     Musculoskeletal  (+) Arthritis ,   Abdominal   Peds  Hematology negative hematology ROS (+)   Anesthesia Other Findings   Reproductive/Obstetrics                             Anesthesia Physical Anesthesia Plan  ASA: III  Anesthesia Plan: General   Post-op Pain Management:    Induction: Intravenous  PONV Risk Score and Plan: 2 and Ondansetron and Dexamethasone  Airway Management Planned: Oral ETT  Additional Equipment: None  Intra-op Plan:   Post-operative Plan: Extubation in OR  Informed Consent: I have reviewed the patients History and Physical, chart, labs and discussed the procedure including the risks, benefits and alternatives for the proposed anesthesia with the patient or authorized representative who has indicated his/her understanding and acceptance.   Dental advisory given  Plan Discussed with: CRNA and Surgeon  Anesthesia Plan Comments:         Anesthesia Quick Evaluation

## 2017-11-13 ENCOUNTER — Encounter (HOSPITAL_COMMUNITY): Payer: Self-pay | Admitting: General Surgery

## 2017-11-26 ENCOUNTER — Ambulatory Visit: Payer: Self-pay | Admitting: Cardiology

## 2018-02-06 ENCOUNTER — Emergency Department (HOSPITAL_COMMUNITY): Payer: Managed Care, Other (non HMO)

## 2018-02-06 ENCOUNTER — Other Ambulatory Visit: Payer: Self-pay

## 2018-02-06 ENCOUNTER — Ambulatory Visit (HOSPITAL_BASED_OUTPATIENT_CLINIC_OR_DEPARTMENT_OTHER)
Admission: EM | Admit: 2018-02-06 | Discharge: 2018-02-06 | Disposition: A | Discharge status: Home / Self Care | Payer: Managed Care, Other (non HMO) | Source: Home / Self Care

## 2018-02-06 ENCOUNTER — Encounter (HOSPITAL_COMMUNITY): Payer: Self-pay | Admitting: Emergency Medicine

## 2018-02-06 ENCOUNTER — Emergency Department (HOSPITAL_COMMUNITY)
Admission: EM | Admit: 2018-02-06 | Discharge: 2018-02-06 | Disposition: A | Discharge status: Home / Self Care | Payer: Managed Care, Other (non HMO) | Attending: Emergency Medicine | Admitting: Emergency Medicine

## 2018-02-06 DIAGNOSIS — F17210 Nicotine dependence, cigarettes, uncomplicated: Secondary | ICD-10-CM | POA: Insufficient documentation

## 2018-02-06 DIAGNOSIS — Z79899 Other long term (current) drug therapy: Secondary | ICD-10-CM | POA: Diagnosis not present

## 2018-02-06 DIAGNOSIS — R0602 Shortness of breath: Secondary | ICD-10-CM | POA: Diagnosis not present

## 2018-02-06 DIAGNOSIS — R609 Edema, unspecified: Secondary | ICD-10-CM | POA: Insufficient documentation

## 2018-02-06 DIAGNOSIS — M79605 Pain in left leg: Secondary | ICD-10-CM | POA: Diagnosis present

## 2018-02-06 DIAGNOSIS — R05 Cough: Secondary | ICD-10-CM | POA: Insufficient documentation

## 2018-02-06 LAB — I-STAT CHEM 8, ED
BUN: 7 mg/dL (ref 6–20)
CALCIUM ION: 1.15 mmol/L (ref 1.15–1.40)
Chloride: 108 mmol/L (ref 98–111)
Creatinine, Ser: 1 mg/dL (ref 0.44–1.00)
Glucose, Bld: 100 mg/dL — ABNORMAL HIGH (ref 70–99)
HCT: 42 % (ref 36.0–46.0)
HEMOGLOBIN: 14.3 g/dL (ref 12.0–15.0)
Potassium: 3.4 mmol/L — ABNORMAL LOW (ref 3.5–5.1)
SODIUM: 142 mmol/L (ref 135–145)
TCO2: 22 mmol/L (ref 22–32)

## 2018-02-06 LAB — CBC WITH DIFFERENTIAL/PLATELET
BASOS PCT: 0 %
Basophils Absolute: 0 10*3/uL (ref 0.0–0.1)
EOS ABS: 0.2 10*3/uL (ref 0.0–0.7)
EOS PCT: 1 %
HCT: 41.9 % (ref 36.0–46.0)
Hemoglobin: 13.9 g/dL (ref 12.0–15.0)
LYMPHS ABS: 3.3 10*3/uL (ref 0.7–4.0)
Lymphocytes Relative: 21 %
MCH: 27.5 pg (ref 26.0–34.0)
MCHC: 33.2 g/dL (ref 30.0–36.0)
MCV: 83 fL (ref 78.0–100.0)
MONOS PCT: 5 %
Monocytes Absolute: 0.8 10*3/uL (ref 0.1–1.0)
Neutro Abs: 11.7 10*3/uL — ABNORMAL HIGH (ref 1.7–7.7)
Neutrophils Relative %: 73 %
PLATELETS: 339 10*3/uL (ref 150–400)
RBC: 5.05 MIL/uL (ref 3.87–5.11)
RDW: 15.1 % (ref 11.5–15.5)
WBC: 16 10*3/uL — AB (ref 4.0–10.5)

## 2018-02-06 LAB — D-DIMER, QUANTITATIVE (NOT AT ARMC): D DIMER QUANT: 2.66 ug{FEU}/mL — AB (ref 0.00–0.50)

## 2018-02-06 MED ORDER — IBUPROFEN 800 MG PO TABS: 800.0000 mg | ORAL_TABLET | Freq: Three times a day (TID) | ORAL | 0 refills | Status: DC

## 2018-02-06 MED ORDER — IOPAMIDOL (ISOVUE-370) INJECTION 76%
100.0000 mL | Freq: Once | INTRAVENOUS | Status: AC | PRN
  Administered 2018-02-06: 100 mL via INTRAVENOUS

## 2018-02-06 MED ORDER — MORPHINE SULFATE (PF) 4 MG/ML IV SOLN
4.0000 mg | Freq: Once | INTRAVENOUS | Status: AC
  Administered 2018-02-06: 4 mg via INTRAVENOUS
  Filled 2018-02-06: qty 1

## 2018-02-06 MED ORDER — IOPAMIDOL (ISOVUE-370) INJECTION 76%
INTRAVENOUS | Status: AC
  Filled 2018-02-06: qty 100

## 2018-02-06 MED ORDER — ALBUTEROL SULFATE HFA 108 (90 BASE) MCG/ACT IN AERS
2.0000 | INHALATION_SPRAY | RESPIRATORY_TRACT | Status: DC | PRN
  Administered 2018-02-06: 2 via RESPIRATORY_TRACT
  Filled 2018-02-06: qty 6.7

## 2018-02-06 NOTE — Progress Notes (Addendum)
Left lower extremity venous duplex has been completed. Negative for obvious evidence of DVT. Results were given to Dr. Criss Alvine.  02/06/18 6:25 PM Olen Cordial RVT

## 2018-02-06 NOTE — Discharge Instructions (Signed)
You have been evaluated for your leg swelling and shortness of breath.  There are no evidence of blood clots in your legs or your lung.  Please follow-up closely with your primary care doctor for further evaluation of his symptoms and to assess for potential congestive heart failure.  Avoid tobacco use, use albuterol inhaler 2 puffs every 4 hours as needed for shortness of breath.  Keep your leg elevated while at rest and remember to take short walks throughout the day to help decrease swelling.

## 2018-02-06 NOTE — ED Provider Notes (Signed)
[facility]-EMERGENCY DEPT Provider Note   CSN: [id number] Arrival date & time: 02/06/18  1643     History   Chief Complaint Chief Complaint  Patient presents with  . Leg Pain  . Leg Swelling    HPI Kelsey Morgan is a 47 y.o. female.  The history is provided by the patient. No language interpreter was used.  Leg Pain       47 year old female with history of gout, arthritis, back pain presenting complaining of left leg pain.  Patient report pain and swelling to her left lower extremity ongoing for the past 2 and half months.  Described pain as a throbbing achy sensation and now 10 out of 10.  Furthermore he also complaining of shortness of breath for the past 2 days with occasional cough.  She does not complain of any fever, chills, pleuritic chest pain, nausea vomiting diarrhea or any recent injury.  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  She denies any prior history of PE or DVT.  She does not take any oral hormone and denies any recent injury or recent surgery.  She does complaining of team sensation to her left lower extremity due to the increased swelling.  She has tried elevation, rest, ice, heat, using warm compress, taking Tylenol and ibuprofen at home for her symptoms  Past Medical History:  Diagnosis Date  . Arthritis   . Back pain   . Bronchitis   . Gout   . Pneumonia     Patient Active Problem List   Diagnosis Date Noted  . Chest pain 10/18/2016  . Leucocytosis 10/18/2016    Past Surgical History:  Procedure Laterality Date  . ABDOMINAL HYSTERECTOMY    . ANKLE SURGERY    . CHOLECYSTECTOMY    . EXCISION MASS NECK N/A 11/12/2017   Procedure: EXCISION POSTERIOR  NECK CYST;  Surgeon: Axel Filler, MD;  Location: [facility] OR;  Service: General;  Laterality: N/A;  . FOOT SURGERY    . FRACTURE SURGERY      . KNEE SURGERY       OB History   None      Home Medications    Prior to Admission medications   Medication Sig Start Date End Date Taking? Authorizing Provider  atorvastatin (LIPITOR) 40 MG tablet Take 1 tablet (40 mg total) by mouth daily at 6 PM. Patient taking differently: Take 40 mg by mouth at bedtime.  10/19/16   Philip Aspen, Limmie Patricia, MD  Azelastine HCl 0.15 % SOLN Place 2 sprays into both nostrils daily. 10/10/17   [provider]  fexofenadine (ALLEGRA) 180 MG tablet Take 180 mg by mouth daily.    [provider]  Multiple Vitamin (MULTIVITAMIN WITH MINERALS) TABS tablet Take 1 tablet by mouth daily.    [provider]  ranitidine (ZANTAC) 150 MG tablet Take 150 mg by mouth 2 (two) times daily as needed for heartburn.     [provider]  sertraline (ZOLOFT) 100 MG tablet Take 100 mg by mouth at bedtime.     [provider]  tiZANidine (ZANAFLEX) 4 MG tablet Take 4 mg by mouth at bedtime.  12/01/15   [provider]  traMADol (ULTRAM) 50 MG tablet Take 1 tablet (50 mg total) by mouth every 6 (six) hours as needed. 11/12/17 11/12/18  Axel Filler, MD  Family History Family History  Problem Relation Age of Onset  . Hypertension Mother   . Kidney failure Mother     Social History Social History   Tobacco Use  . Smoking status: Current Every Day Smoker    Packs/day: 0.25    Years: 25.00    Pack years: 6.25    Types: Cigarettes  . Smokeless tobacco: Never Used  Substance Use Topics  . Alcohol use: No  . Drug use: No     Allergies   Hydrocodone; Percocet [oxycodone-acetaminophen]; and Vicodin [hydrocodone-acetaminophen]   Review of Systems Review of Systems  All other systems reviewed and are negative.    Physical Exam Updated Vital Signs BP (!) 165/87 (BP Location: Right Arm)   Pulse 86   Temp 98.2 F (36.8 C) (Oral)   Resp 20   Ht 5\' 6"  (1.676 m)   Wt 127 kg   SpO2 95%   BMI 45.19  kg/m   Physical Exam  Constitutional: She appears well-developed and well-nourished. No distress.  Moderately obese female resting in bed comfortably in no acute discomfort.  Nontoxic   HENT:  Head: Atraumatic.  Eyes: Conjunctivae are normal.  Neck: Neck supple.  Cardiovascular: Normal rate and regular rhythm.  Pulmonary/Chest: Effort normal and breath sounds normal.  Musculoskeletal: She exhibits edema (1+ pitting edema to left lower extremity extending to the thigh as compared to right.  It is difficult to fully evaluate due to her large body habitus.  Dorsalis pedis pulse palpable bilaterally, leg compartment is soft, scar noted to dorsum of left foo).  Neurological: She is alert.  Skin: No rash noted.  Psychiatric: She has a normal mood and affect.  Nursing note and vitals reviewed.    ED Treatments / Results  Labs (all labs ordered are listed, but only abnormal results are displayed) Labs Reviewed  CBC WITH DIFFERENTIAL/PLATELET - Abnormal; Notable for the following components:      Result Value   WBC 16.0 (*)    Neutro Abs 11.7 (*)    All other components within normal limits  D-DIMER, QUANTITATIVE (NOT AT [facility]) - Abnormal; Notable for the following components:   D-Dimer, Quant 2.66 (*)    All other components within normal limits  I-STAT CHEM 8, ED - Abnormal; Notable for the following components:   Potassium 3.4 (*)    Glucose, Bld 100 (*)    All other components within normal limits    EKG None  ED ECG REPORT   Date: 02/06/2018  Rate: 72  Rhythm: normal sinus rhythm  QRS Axis: normal  Intervals: normal  ST/T Wave abnormalities: nonspecific ST changes  Conduction Disutrbances:none  Narrative Interpretation:   Old EKG Reviewed: unchanged  I have personally reviewed the EKG tracing and agree with the computerized printout as noted.   Radiology Dg Chest 2 View  Result Date: 02/06/2018 CLINICAL DATA:  47 y/o  F; shortness of breath. EXAM: CHEST - 2 VIEW  COMPARISON:  10/17/2016 chest radiograph.  10/18/2016 CTA chest. FINDINGS: Mild cardiomegaly. Hazy and reticular opacities of the lungs. No pleural effusion or pneumothorax. Mild multilevel degenerative changes of the spine. Right upper quadrant cholecystectomy clips. IMPRESSION: Mild cardiomegaly. Hazy and reticular opacities of lungs probably represents pulmonary edema. Electronically Signed   By: Mitzi Hansen M.D.   On: 02/06/2018 19:15   Ct Angio Chest Pe W And/or Wo Contrast  Result Date: 02/06/2018 CLINICAL DATA:  Lower extremity swelling.  Sedentary. EXAM: CT ANGIOGRAPHY CHEST WITH CONTRAST TECHNIQUE: Multidetector  CT imaging of the chest was performed using the standard protocol during bolus administration of intravenous contrast. Multiplanar CT image reconstructions and MIPs were obtained to evaluate the vascular anatomy. CONTRAST:  ISOVUE-370 IOPAMIDOL (ISOVUE-370) INJECTION 76% COMPARISON:  Chest radiograph 02/06/2018 FINDINGS: Cardiovascular: Satisfactory opacification of the pulmonary arteries to the segmental level. No evidence of pulmonary embolism. Normal heart size. No pericardial effusion. Mediastinum/Nodes: Bilateral hilar and peribronchial lymph nodes. Shotty mediastinal lymph nodes. Normal appearance of the trachea, esophagus and thyroid gland. Lungs/Pleura: Upper lobe predominant centrilobular emphysema. Mild crowding of the interstitium. Small hazy right upper lobe subpleural airspace opacities, likely inflammatory. Upper Abdomen: Post cholecystectomy.  No acute findings. Musculoskeletal: No chest wall abnormality. No acute or significant osseous findings. Review of the MIP images confirms the above findings. IMPRESSION: No evidence of pulmonary embolus. Bilateral hilar and peribronchial lymphadenopathy with uncertain etiology. Associated shotty mediastinal lymph nodes. Upper lobe predominant centrilobular emphysema. Mild crowding of the interstitium which may be due to  mild interstitial pulmonary edema or hypoinflation. Electronically Signed   By: Ted Mcalpine M.D.   On: 02/06/2018 20:08    Procedures Procedures (including critical care time)  [] Hover for details Left lower extremity venous duplex has been completed. Negative for obvious evidence of DVT. Results were given to Dr. Criss Alvine.  02/06/18 6:25 PM Olen Cordial RVT   Medications Ordered in ED Medications  albuterol (PROVENTIL HFA;VENTOLIN HFA) 108 (90 Base) MCG/ACT inhaler 2 puff (has no administration in time range)  morphine 4 MG/ML injection 4 mg (4 mg Intravenous Given 02/06/18 1806)  morphine 4 MG/ML injection 4 mg (4 mg Intravenous Given 02/06/18 1934)  iopamidol (ISOVUE-370) 76 % injection 100 mL (100 mLs Intravenous Contrast Given 02/06/18 1941)     Initial Impression / Assessment and Plan / ED Course  I have reviewed the triage vital signs and the nursing notes.  Pertinent labs & imaging results that were available during my care of the patient were reviewed by me and considered in my medical decision making (see chart for details).     BP 111/63   Pulse 70   Temp 98.2 F (36.8 C) (Oral)   Resp 19   Ht 5\' 6"  (1.676 m)   Wt 127 kg   SpO2 95%   BMI 45.19 kg/m    Final Clinical Impressions(s) / ED Diagnoses   Final diagnoses:  Peripheral edema    ED Discharge Orders    None     5:42 PM Patient here with complaints of left lower extremity swelling and pain.  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  She would benefit from a venous Doppler ultrasound for further evaluation.  She also complaining of new onset of shortness of breath for the past 2 days.  Will obtain a d-dimer, if positive and she has evidence of DVT I will obtain chest CT angiogram to rule out potential PE.  Pain medication given.  Work-up initiated.  I do not see any evidence to suggest leg compartment syndrome, or cellulitis.  7:01 PM Patient has mild tachypnea.  She has  an elevated white count of 16.0, and an elevated d-dimer of 2.66.  Vascular ultrasound of the left lower extremity is without evidence of DVT.  Will obtain chest CT angiogram to rule out PE.  8:47 PM Initial chest x-ray shows mild cardiomegaly, hazy and reticular opacities of lung probably represents pulmonary edema.  Chest CT angiogram shows no evidence of PE.  Mild crowding  of the interstitium which may be due to mild residual pulmonary edema or hypo-inflation.  Evidence of emphysema.  Since patient is a smoker, will provide albuterol inhaler.  No hypoxia here.  She would need to be evaluated outpatient for potential CHF.  Encourage patient to follow-up with PCP for further care.  Return precautions discussed.  Patient is stable for discharge.   Fayrene Helper, PA-C 02/06/18 2052    Pricilla Loveless, MD 02/06/18 [phone]

## 2018-02-06 NOTE — ED Triage Notes (Signed)
Pt report left lower leg pain and swelling behind knee and below knee x 2 months. 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

## 2018-02-23 ENCOUNTER — Encounter: Payer: Self-pay | Admitting: Physical Medicine & Rehabilitation

## 2018-02-26 ENCOUNTER — Encounter (HOSPITAL_COMMUNITY): Payer: Self-pay | Admitting: *Deleted

## 2018-02-26 ENCOUNTER — Other Ambulatory Visit: Payer: Self-pay

## 2018-02-26 ENCOUNTER — Emergency Department (HOSPITAL_COMMUNITY): Payer: Managed Care, Other (non HMO)

## 2018-02-26 ENCOUNTER — Inpatient Hospital Stay (HOSPITAL_COMMUNITY)
Admission: EM | Admit: 2018-02-26 | Discharge: 2018-03-01 | DRG: 291 | Disposition: A | Discharge status: Home / Self Care | Payer: Managed Care, Other (non HMO) | Attending: Internal Medicine | Admitting: Internal Medicine

## 2018-02-26 DIAGNOSIS — K219 Gastro-esophageal reflux disease without esophagitis: Secondary | ICD-10-CM | POA: Diagnosis present

## 2018-02-26 DIAGNOSIS — J40 Bronchitis, not specified as acute or chronic: Secondary | ICD-10-CM | POA: Diagnosis present

## 2018-02-26 DIAGNOSIS — I110 Hypertensive heart disease with heart failure: Principal | ICD-10-CM | POA: Diagnosis present

## 2018-02-26 DIAGNOSIS — E876 Hypokalemia: Secondary | ICD-10-CM | POA: Diagnosis present

## 2018-02-26 DIAGNOSIS — Z23 Encounter for immunization: Secondary | ICD-10-CM

## 2018-02-26 DIAGNOSIS — Z7951 Long term (current) use of inhaled steroids: Secondary | ICD-10-CM

## 2018-02-26 DIAGNOSIS — Z87898 Personal history of other specified conditions: Secondary | ICD-10-CM | POA: Diagnosis present

## 2018-02-26 DIAGNOSIS — R0602 Shortness of breath: Secondary | ICD-10-CM | POA: Diagnosis not present

## 2018-02-26 DIAGNOSIS — E785 Hyperlipidemia, unspecified: Secondary | ICD-10-CM | POA: Diagnosis present

## 2018-02-26 DIAGNOSIS — Z87891 Personal history of nicotine dependence: Secondary | ICD-10-CM

## 2018-02-26 DIAGNOSIS — I4581 Long QT syndrome: Secondary | ICD-10-CM | POA: Diagnosis present

## 2018-02-26 DIAGNOSIS — I509 Heart failure, unspecified: Secondary | ICD-10-CM

## 2018-02-26 DIAGNOSIS — Z79899 Other long term (current) drug therapy: Secondary | ICD-10-CM

## 2018-02-26 DIAGNOSIS — R590 Localized enlarged lymph nodes: Secondary | ICD-10-CM | POA: Diagnosis present

## 2018-02-26 DIAGNOSIS — R9431 Abnormal electrocardiogram [ECG] [EKG]: Secondary | ICD-10-CM | POA: Diagnosis present

## 2018-02-26 DIAGNOSIS — I5033 Acute on chronic diastolic (congestive) heart failure: Secondary | ICD-10-CM | POA: Diagnosis present

## 2018-02-26 DIAGNOSIS — Z885 Allergy status to narcotic agent status: Secondary | ICD-10-CM

## 2018-02-26 DIAGNOSIS — J9601 Acute respiratory failure with hypoxia: Secondary | ICD-10-CM

## 2018-02-26 DIAGNOSIS — Z6841 Body Mass Index (BMI) 40.0 and over, adult: Secondary | ICD-10-CM

## 2018-02-26 DIAGNOSIS — J209 Acute bronchitis, unspecified: Secondary | ICD-10-CM | POA: Diagnosis present

## 2018-02-26 DIAGNOSIS — D72829 Elevated white blood cell count, unspecified: Secondary | ICD-10-CM

## 2018-02-26 LAB — BASIC METABOLIC PANEL
ANION GAP: 11 (ref 5–15)
BUN: 7 mg/dL (ref 6–20)
CALCIUM: 8.9 mg/dL (ref 8.9–10.3)
CO2: 22 mmol/L (ref 22–32)
Chloride: 108 mmol/L (ref 98–111)
Creatinine, Ser: 1.02 mg/dL — ABNORMAL HIGH (ref 0.44–1.00)
GFR calc non Af Amer: >60 mL/min (ref >60)
GLUCOSE: 124 mg/dL — AB (ref 70–99)
POTASSIUM: 3.4 mmol/L — AB (ref 3.5–5.1)
SODIUM: 141 mmol/L (ref 135–145)

## 2018-02-26 LAB — CBC
HCT: 42.8 % (ref 36.0–46.0)
HEMOGLOBIN: 13.9 g/dL (ref 12.0–15.0)
MCH: 27.5 pg (ref 26.0–34.0)
MCHC: 32.5 g/dL (ref 30.0–36.0)
MCV: 84.6 fL (ref 78.0–100.0)
Platelets: 367 10*3/uL (ref 150–400)
RBC: 5.06 MIL/uL (ref 3.87–5.11)
RDW: 14.6 % (ref 11.5–15.5)
WBC: 18.4 10*3/uL — ABNORMAL HIGH (ref 4.0–10.5)

## 2018-02-26 LAB — POCT I-STAT TROPONIN I: TROPONIN I, POC: 0 ng/mL (ref 0.00–0.08)

## 2018-02-26 LAB — BRAIN NATRIURETIC PEPTIDE: B Natriuretic Peptide: 40.3 pg/mL (ref 0.0–100.0)

## 2018-02-26 MED ORDER — ALBUTEROL SULFATE (2.5 MG/3ML) 0.083% IN NEBU
5.0000 mg | INHALATION_SOLUTION | Freq: Once | RESPIRATORY_TRACT | Status: AC
  Administered 2018-02-26: 5 mg via RESPIRATORY_TRACT
  Filled 2018-02-26: qty 6

## 2018-02-26 MED ORDER — DEXAMETHASONE SODIUM PHOSPHATE 10 MG/ML IJ SOLN
10.0000 mg | Freq: Once | INTRAMUSCULAR | Status: AC
  Administered 2018-02-26: 10 mg via INTRAMUSCULAR
  Filled 2018-02-26: qty 1

## 2018-02-26 MED ORDER — DIPHENHYDRAMINE HCL 25 MG PO CAPS
25.0000 mg | ORAL_CAPSULE | Freq: Once | ORAL | Status: AC
  Administered 2018-02-26: 25 mg via ORAL
  Filled 2018-02-26: qty 1

## 2018-02-26 MED ORDER — IPRATROPIUM BROMIDE 0.02 % IN SOLN
0.5000 mg | Freq: Once | RESPIRATORY_TRACT | Status: AC
  Administered 2018-02-26: 0.5 mg via RESPIRATORY_TRACT
  Filled 2018-02-26: qty 2.5

## 2018-02-26 MED ORDER — HYDROCODONE-HOMATROPINE 5-1.5 MG/5ML PO SYRP
5.0000 mL | ORAL_SOLUTION | Freq: Once | ORAL | Status: AC
  Administered 2018-02-26: 5 mL via ORAL
  Filled 2018-02-26: qty 5

## 2018-02-26 NOTE — ED Notes (Signed)
Patient transported to X-ray 

## 2018-02-26 NOTE — ED Provider Notes (Signed)
[facility]-EMERGENCY DEPT Provider Note   CSN: [id number] Arrival date & time: 02/26/18  1934    History   Chief Complaint Chief Complaint  Patient presents with  . Shortness of Breath    HPI Kelsey Morgan is a 47 y.o. female.  47 y/o female with hx of arthritis, bronchitis presents to the ED for evaluation of shortness of breath and productive cough.  States that symptoms have been present for the past 3 weeks.  Has had associated wheezing which will temporarily improved following a home nebulizer treatment.  She was seen outpatient at urgent care x2 as well as by her primary care doctor.  Patient reports trialing cough syrups as well as 2 courses of antibiotics without relief of her symptoms.  Notes subjective and low-grade temperatures; nothing above 100 F.  Will feel more short of breath on exertion.  Has been experiencing chest pain which is worse with coughing.  No V/D.  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  Was started on Lasix by her PCP which has been helping some of her lower extremity swelling.  Quit smoking after ED visit earlier this month. Denies vaping.  The history is provided by the patient. No language interpreter was used.  Shortness of Breath  Associated symptoms include a fever (subjective, low grade) and cough.    Past Medical History:  Diagnosis Date  . Arthritis   . Back pain   . Bronchitis   . Gout   . Pneumonia     Patient Active Problem List   Diagnosis Date Noted  . Chest pain 10/18/2016  . Leucocytosis 10/18/2016    Past Surgical History:  Procedure Laterality Date  . ABDOMINAL HYSTERECTOMY    . ANKLE SURGERY    . CHOLECYSTECTOMY    . EXCISION MASS NECK N/A 11/12/2017   Procedure: EXCISION POSTERIOR  NECK CYST;  Surgeon: Axel Filler, MD;  Location: [facility] OR;  Service: General;  Laterality: N/A;  . FOOT SURGERY    . FRACTURE SURGERY    . KNEE  SURGERY       OB History   None      Home Medications    Prior to Admission medications   Medication Sig Start Date End Date Taking? Authorizing Provider  albuterol (PROVENTIL) (2.5 MG/3ML) 0.083% nebulizer solution Inhale 3 mLs into the lungs every 4 (four) hours as needed for shortness of breath.  02/22/18  Yes [provider]  atorvastatin (LIPITOR) 40 MG tablet Take 1 tablet (40 mg total) by mouth daily at 6 PM. Patient taking differently: Take 40 mg by mouth at bedtime.  10/19/16  Yes Philip Aspen, Limmie Patricia, MD  Dextromethorphan-guaiFENesin [facility] DM) 30-600 MG TB12 Take 1 tablet by mouth 2 (two) times daily.   Yes [provider]  fluticasone (FLONASE) 50 MCG/ACT nasal spray Place 2 sprays into both nostrils daily.   Yes [provider]  furosemide (LASIX) 20 MG tablet Take 20 mg by mouth daily. 02/16/18  Yes [provider]  guaiFENesin-dextromethorphan (ROBITUSSIN DM) 100-10 MG/5ML syrup Take 5 mLs by mouth every 4 (four) hours as needed for cough.   Yes [provider]  HYDROMET 5-1.5 MG/5ML syrup Take 5 mLs by mouth every 6 (six) hours as needed for cough.  02/22/18  Yes [provider]  ibuprofen (ADVIL,MOTRIN) 800 MG tablet Take 1 tablet (800 mg total) by mouth 3 (three) times daily.  02/06/18  Yes Fayrene Helper, PA-C  Multiple Vitamin (MULTIVITAMIN WITH MINERALS) TABS tablet Take 1 tablet by mouth daily.   Yes [provider]  pseudoephedrine-acetaminophen (TYLENOL SINUS) 30-500 MG TABS tablet Take 1 tablet by mouth every morning.   Yes [provider]  ranitidine (ZANTAC) 150 MG tablet Take 150 mg by mouth 2 (two) times daily as needed for heartburn.    Yes [provider]  sertraline (ZOLOFT) 100 MG tablet Take 100 mg by mouth at bedtime.    Yes [provider]  tiZANidine (ZANAFLEX) 4 MG tablet Take 4 mg by mouth at bedtime.  12/01/15  Yes [provider]  traMADol (ULTRAM) 50 MG  tablet Take 1 tablet (50 mg total) by mouth every 6 (six) hours as needed. Patient not taking: Reported on 02/06/2018 11/12/17 11/12/18  Axel Filler, MD    Family History Family History  Problem Relation Age of Onset  . Hypertension Mother   . Kidney failure Mother     Social History Social History   Tobacco Use  . Smoking status: Current Every Day Smoker    Packs/day: 0.25    Years: 25.00    Pack years: 6.25    Types: Cigarettes  . Smokeless tobacco: Never Used  Substance Use Topics  . Alcohol use: No  . Drug use: No     Allergies   Hydrocodone; Percocet [oxycodone-acetaminophen]; and Vicodin [hydrocodone-acetaminophen]   Review of Systems Review of Systems  Constitutional: Positive for fever (subjective, low grade).  Respiratory: Positive for cough and shortness of breath.   Ten systems reviewed and are negative for acute change, except as noted in the HPI.    Physical Exam Updated Vital Signs BP (!) 147/80   Pulse 79   Temp 98.7 F (37.1 C) (Oral)   Resp 19   Ht 5\' 6"  (1.676 m)   Wt 131.1 kg   SpO2 (!) 87%   BMI 46.65 kg/m   Physical Exam  Constitutional: She is oriented to person, place, and time. She appears well-developed and well-nourished. No distress.  Nontoxic appearing and in NAD. Morbidly obese.  HENT:  Head: Normocephalic and atraumatic.  Eyes: Conjunctivae and EOM are normal. No scleral icterus.  Neck: Normal range of motion.  Cardiovascular: Normal rate, regular rhythm and intact distal pulses.  Pulmonary/Chest: Effort normal. No respiratory distress. She has wheezes. She has no rales.  Mild dyspnea without tachypnea. Diffuse expiratory wheeze; audible at rest.  Musculoskeletal: Normal range of motion.  Neurological: She is alert and oriented to person, place, and time. She exhibits normal muscle tone. Coordination normal.  Skin: Skin is warm and dry. No rash noted. She is not diaphoretic. No erythema. No pallor.  Psychiatric: She has a  normal mood and affect. Her behavior is normal.  Nursing note and vitals reviewed.    ED Treatments / Results  Labs (all labs ordered are listed, but only abnormal results are displayed) Labs Reviewed  BASIC METABOLIC PANEL - Abnormal; Notable for the following components:      Result Value   Potassium 3.4 (*)    Glucose, Bld 124 (*)    Creatinine, Ser 1.02 (*)    All other components within normal limits  CBC - Abnormal; Notable for the following components:   WBC 18.4 (*)    All other components within normal limits  BLOOD GAS, ARTERIAL - Abnormal; Notable for the following components:   pO2, Arterial 54.2 (*)    All other components within normal  limits  BRAIN NATRIURETIC PEPTIDE  MAGNESIUM  POCT I-STAT TROPONIN I    EKG ED ECG REPORT   Date: 02/26/2018  Rate: 71  Rhythm: normal sinus rhythm and premature atrial contractions (PAC)  QRS Axis: normal  Intervals: QT prolonged  ST/T Wave abnormalities: nonspecific ST changes  Conduction Disutrbances:none  Narrative Interpretation: NSR with PAC and prolonged QT; probably anteroseptal infarct, old.  Old EKG Reviewed: none available  I have personally reviewed the EKG tracing and agree with the computerized printout as noted.   Radiology Dg Chest 2 View  Result Date: 02/26/2018 CLINICAL DATA:  Chest congestion, body ache and fever EXAM: CHEST - 2 VIEW COMPARISON:  02/06/2018 FINDINGS: Crowding of interstitial lung markings due to low lung volumes. Mild pulmonary vascular congestion and interstitial edema. No alveolar consolidation, effusion or pneumothorax. Stable mild cardiomegaly. No aortic aneurysm. No acute osseous abnormality. IMPRESSION: Stable mild cardiomegaly with mild interstitial edema. Electronically Signed   By: Tollie Eth M.D.   On: 02/26/2018 21:08    Procedures Procedures (including critical care time)  Medications Ordered in ED Medications  magnesium sulfate IVPB 2 g 50 mL (2 g Intravenous New  Bag/Given 02/27/18 0232)  furosemide (LASIX) injection 40 mg (has no administration in time range)  albuterol (PROVENTIL) (2.5 MG/3ML) 0.083% nebulizer solution 5 mg (5 mg Nebulization Given 02/26/18 2113)  ipratropium (ATROVENT) nebulizer solution 0.5 mg (0.5 mg Nebulization Given 02/26/18 2354)  albuterol (PROVENTIL) (2.5 MG/3ML) 0.083% nebulizer solution 5 mg (5 mg Nebulization Given 02/26/18 2354)  dexamethasone (DECADRON) injection 10 mg (10 mg Intramuscular Given 02/26/18 2352)  HYDROcodone-homatropine (HYCODAN) 5-1.5 MG/5ML syrup 5 mL (5 mLs Oral Given 02/26/18 2353)  diphenhydrAMINE (BENADRYL) capsule 25 mg (25 mg Oral Given 02/26/18 2353)  albuterol (PROVENTIL) (2.5 MG/3ML) 0.083% nebulizer solution 5 mg (5 mg Nebulization Given 02/27/18 0227)  ipratropium (ATROVENT) nebulizer solution 0.5 mg (0.5 mg Nebulization Given 02/27/18 0227)    CRITICAL CARE Performed by: Antony Madura   Total critical care time: 35 minutes  Critical care time was exclusive of separately billable procedures and treating other patients.  Critical care was necessary to treat or prevent imminent or life-threatening deterioration.  Critical care was time spent personally by me on the following activities: development of treatment plan with patient and/or surrogate as well as nursing, discussions with consultants, evaluation of patient's response to treatment, examination of patient, obtaining history from patient or surrogate, ordering and performing treatments and interventions, ordering and review of laboratory studies, ordering and review of radiographic studies, pulse oximetry and re-evaluation of patient's condition.   Initial Impression / Assessment and Plan / ED Course  I have reviewed the triage vital signs and the nursing notes.  Pertinent labs & imaging results that were available during my care of the patient were reviewed by me and considered in my medical decision making (see chart for details).      47 year old female presents to the emergency department for worsening dyspnea.  She has been experiencing symptoms for the past 3 weeks.  Was seen in the emergency department initially with negative lower extremity duplex and CT PE study.  Followed up with urgent care as well as her primary care doctor with no improvement following 2 courses of antibiotics.  Notes low-grade fever with maximum temperature of 100F.  Was a smoker until 3 weeks ago.  Patient with audible wheeze on initial exam.  This has slightly improved following albuterol nebulizer treatment.  She was also given a dose of Decadron  and magnesium infusion.  Chest x-ray is largely unchanged compared to CT from 1 month prior.  Mild cardiomegaly is stable.  There is also mild interstitial edema.  Patient with hypoxia on ambulation down to 84%.  She has had no improvement following DuoNeb treatments.  Unclear cause of symptoms today.  Will admit for further evaluation.  Question possible new onset lupus or sarcoid.  Case discussed with Dr. Katrinka Blazing who will admit.   Final Clinical Impressions(s) / ED Diagnoses   Final diagnoses:  Acute respiratory failure with hypoxia (HCC)  SOB (shortness of breath)    ED Discharge Orders    None       Antony Madura, PA-C 02/27/18 7829    Benjiman Core, MD 02/28/18 [phone]

## 2018-02-26 NOTE — ED Triage Notes (Signed)
Pt reports shortness of breath and productive cough for about 3 weeks, has been treated with abx, nebs and cough syrups without relief. Arrives from [place] for further eval. Low grade  Temps.

## 2018-02-27 ENCOUNTER — Encounter (HOSPITAL_COMMUNITY): Payer: Self-pay | Admitting: Internal Medicine

## 2018-02-27 ENCOUNTER — Inpatient Hospital Stay (HOSPITAL_COMMUNITY): Payer: Managed Care, Other (non HMO)

## 2018-02-27 DIAGNOSIS — I5031 Acute diastolic (congestive) heart failure: Secondary | ICD-10-CM | POA: Diagnosis not present

## 2018-02-27 DIAGNOSIS — I350 Nonrheumatic aortic (valve) stenosis: Secondary | ICD-10-CM | POA: Diagnosis not present

## 2018-02-27 DIAGNOSIS — D72829 Elevated white blood cell count, unspecified: Secondary | ICD-10-CM

## 2018-02-27 DIAGNOSIS — R9431 Abnormal electrocardiogram [ECG] [EKG]: Secondary | ICD-10-CM | POA: Diagnosis present

## 2018-02-27 DIAGNOSIS — Z87898 Personal history of other specified conditions: Secondary | ICD-10-CM | POA: Diagnosis present

## 2018-02-27 DIAGNOSIS — Z7951 Long term (current) use of inhaled steroids: Secondary | ICD-10-CM | POA: Diagnosis not present

## 2018-02-27 DIAGNOSIS — I509 Heart failure, unspecified: Secondary | ICD-10-CM

## 2018-02-27 DIAGNOSIS — K219 Gastro-esophageal reflux disease without esophagitis: Secondary | ICD-10-CM | POA: Diagnosis present

## 2018-02-27 DIAGNOSIS — Z885 Allergy status to narcotic agent status: Secondary | ICD-10-CM | POA: Diagnosis not present

## 2018-02-27 DIAGNOSIS — J9601 Acute respiratory failure with hypoxia: Secondary | ICD-10-CM | POA: Diagnosis present

## 2018-02-27 DIAGNOSIS — J209 Acute bronchitis, unspecified: Secondary | ICD-10-CM | POA: Diagnosis present

## 2018-02-27 DIAGNOSIS — E876 Hypokalemia: Secondary | ICD-10-CM | POA: Diagnosis present

## 2018-02-27 DIAGNOSIS — Z6841 Body Mass Index (BMI) 40.0 and over, adult: Secondary | ICD-10-CM | POA: Diagnosis not present

## 2018-02-27 DIAGNOSIS — R590 Localized enlarged lymph nodes: Secondary | ICD-10-CM | POA: Diagnosis present

## 2018-02-27 DIAGNOSIS — I4581 Long QT syndrome: Secondary | ICD-10-CM | POA: Diagnosis present

## 2018-02-27 DIAGNOSIS — E785 Hyperlipidemia, unspecified: Secondary | ICD-10-CM | POA: Diagnosis present

## 2018-02-27 DIAGNOSIS — R0602 Shortness of breath: Secondary | ICD-10-CM | POA: Diagnosis present

## 2018-02-27 DIAGNOSIS — Z79899 Other long term (current) drug therapy: Secondary | ICD-10-CM | POA: Diagnosis not present

## 2018-02-27 DIAGNOSIS — Z23 Encounter for immunization: Secondary | ICD-10-CM | POA: Diagnosis not present

## 2018-02-27 DIAGNOSIS — I5033 Acute on chronic diastolic (congestive) heart failure: Secondary | ICD-10-CM | POA: Diagnosis present

## 2018-02-27 DIAGNOSIS — I110 Hypertensive heart disease with heart failure: Secondary | ICD-10-CM | POA: Diagnosis present

## 2018-02-27 DIAGNOSIS — J40 Bronchitis, not specified as acute or chronic: Secondary | ICD-10-CM | POA: Diagnosis not present

## 2018-02-27 DIAGNOSIS — Z87891 Personal history of nicotine dependence: Secondary | ICD-10-CM | POA: Diagnosis not present

## 2018-02-27 LAB — CBC WITH DIFFERENTIAL/PLATELET
BASOS PCT: 0 %
Basophils Absolute: 0 10*3/uL (ref 0.0–0.1)
EOS PCT: 0 %
Eosinophils Absolute: 0 10*3/uL (ref 0.0–0.7)
HCT: 42.4 % (ref 36.0–46.0)
HEMOGLOBIN: 13.8 g/dL (ref 12.0–15.0)
LYMPHS ABS: 0.6 10*3/uL — AB (ref 0.7–4.0)
Lymphocytes Relative: 6 %
MCH: 27.4 pg (ref 26.0–34.0)
MCHC: 32.5 g/dL (ref 30.0–36.0)
MCV: 84.1 fL (ref 78.0–100.0)
MONO ABS: 0.1 10*3/uL (ref 0.1–1.0)
MONOS PCT: 1 %
NEUTROS PCT: 93 %
Neutro Abs: 10.3 10*3/uL — ABNORMAL HIGH (ref 1.7–7.7)
PLATELETS: 393 10*3/uL (ref 150–400)
RBC: 5.04 MIL/uL (ref 3.87–5.11)
RDW: 14.6 % (ref 11.5–15.5)
WBC: 11 10*3/uL — ABNORMAL HIGH (ref 4.0–10.5)

## 2018-02-27 LAB — TROPONIN I
TROPONIN I: 0.05 ng/mL — AB (ref <0.03)
TROPONIN I: 0.05 ng/mL — AB (ref <0.03)
Troponin I: 0.05 ng/mL (ref <0.03)

## 2018-02-27 LAB — COMPREHENSIVE METABOLIC PANEL
ALBUMIN: 3.7 g/dL (ref 3.5–5.0)
ALK PHOS: 60 U/L (ref 38–126)
ALT: 22 U/L (ref 0–44)
ANION GAP: 12 (ref 5–15)
AST: 42 U/L — ABNORMAL HIGH (ref 15–41)
BUN: 7 mg/dL (ref 6–20)
CALCIUM: 9.2 mg/dL (ref 8.9–10.3)
CO2: 21 mmol/L — ABNORMAL LOW (ref 22–32)
Chloride: 109 mmol/L (ref 98–111)
Creatinine, Ser: 1.09 mg/dL — ABNORMAL HIGH (ref 0.44–1.00)
GFR calc non Af Amer: 59 mL/min — ABNORMAL LOW (ref >60)
Glucose, Bld: 257 mg/dL — ABNORMAL HIGH (ref 70–99)
POTASSIUM: 3.8 mmol/L (ref 3.5–5.1)
Sodium: 142 mmol/L (ref 135–145)
Total Bilirubin: 0.7 mg/dL (ref 0.3–1.2)
Total Protein: 7.7 g/dL (ref 6.5–8.1)

## 2018-02-27 LAB — BLOOD GAS, ARTERIAL
ACID-BASE DEFICIT: 0.9 mmol/L (ref 0.0–2.0)
BICARBONATE: 23.1 mmol/L (ref 20.0–28.0)
Drawn by: 225631
FIO2: 21
O2 Saturation: 87.3 %
PCO2 ART: 38 mmHg (ref 32.0–48.0)
PH ART: 7.4 (ref 7.350–7.450)
PO2 ART: 54.2 mmHg — AB (ref 83.0–108.0)
Patient temperature: 98.7

## 2018-02-27 LAB — C-REACTIVE PROTEIN: CRP: 4.6 mg/dL — ABNORMAL HIGH (ref <1.0)

## 2018-02-27 LAB — HIV ANTIBODY (ROUTINE TESTING W REFLEX): HIV Screen 4th Generation wRfx: NONREACTIVE

## 2018-02-27 LAB — ECHOCARDIOGRAM COMPLETE
HEIGHTINCHES: 66.5 in
WEIGHTICAEL: 4456 [oz_av]

## 2018-02-27 LAB — MAGNESIUM: Magnesium: 1.7 mg/dL (ref 1.7–2.4)

## 2018-02-27 LAB — SEDIMENTATION RATE: Sed Rate: 39 mm/hr — ABNORMAL HIGH (ref 0–22)

## 2018-02-27 LAB — TSH: TSH: 0.898 u[IU]/mL (ref 0.350–4.500)

## 2018-02-27 MED ORDER — ALBUTEROL SULFATE (2.5 MG/3ML) 0.083% IN NEBU
5.0000 mg | INHALATION_SOLUTION | Freq: Once | RESPIRATORY_TRACT | Status: AC
  Administered 2018-02-27: 5 mg via RESPIRATORY_TRACT
  Filled 2018-02-27: qty 6

## 2018-02-27 MED ORDER — MAGNESIUM SULFATE 2 GM/50ML IV SOLN
2.0000 g | INTRAVENOUS | Status: AC
  Administered 2018-02-27: 2 g via INTRAVENOUS
  Filled 2018-02-27: qty 50

## 2018-02-27 MED ORDER — IPRATROPIUM-ALBUTEROL 0.5-2.5 (3) MG/3ML IN SOLN: 3.0000 mL | Freq: Four times a day (QID) | RESPIRATORY_TRACT | Status: DC | PRN

## 2018-02-27 MED ORDER — SODIUM CHLORIDE 0.9% FLUSH
3.0000 mL | Freq: Two times a day (BID) | INTRAVENOUS | Status: DC
  Administered 2018-02-27 – 2018-03-01 (×5): 3 mL via INTRAVENOUS

## 2018-02-27 MED ORDER — POTASSIUM CHLORIDE CRYS ER 20 MEQ PO TBCR
40.0000 meq | EXTENDED_RELEASE_TABLET | Freq: Every day | ORAL | Status: DC
  Administered 2018-02-27 – 2018-03-01 (×3): 40 meq via ORAL
  Filled 2018-02-27 (×3): qty 2

## 2018-02-27 MED ORDER — ENOXAPARIN SODIUM 40 MG/0.4ML ~~LOC~~ SOLN
40.0000 mg | SUBCUTANEOUS | Status: DC
  Filled 2018-02-27: qty 0.4

## 2018-02-27 MED ORDER — IPRATROPIUM BROMIDE 0.02 % IN SOLN
0.5000 mg | Freq: Once | RESPIRATORY_TRACT | Status: AC
  Administered 2018-02-27: 0.5 mg via RESPIRATORY_TRACT
  Filled 2018-02-27: qty 2.5

## 2018-02-27 MED ORDER — SODIUM CHLORIDE 0.9 % IV SOLN: 250.0000 mL | INTRAVENOUS | Status: DC | PRN

## 2018-02-27 MED ORDER — FLUTICASONE PROPIONATE 50 MCG/ACT NA SUSP
2.0000 | Freq: Every day | NASAL | Status: DC
  Administered 2018-02-27 – 2018-03-01 (×3): 2 via NASAL
  Filled 2018-02-27: qty 16

## 2018-02-27 MED ORDER — IPRATROPIUM-ALBUTEROL 0.5-2.5 (3) MG/3ML IN SOLN
3.0000 mL | Freq: Three times a day (TID) | RESPIRATORY_TRACT | Status: DC
  Administered 2018-02-27 – 2018-03-01 (×7): 3 mL via RESPIRATORY_TRACT
  Filled 2018-02-27 (×6): qty 3

## 2018-02-27 MED ORDER — PNEUMOCOCCAL VAC POLYVALENT 25 MCG/0.5ML IJ INJ
0.5000 mL | INJECTION | INTRAMUSCULAR | Status: AC
  Administered 2018-03-01: 0.5 mL via INTRAMUSCULAR
  Filled 2018-02-27: qty 0.5

## 2018-02-27 MED ORDER — ATORVASTATIN CALCIUM 40 MG PO TABS
40.0000 mg | ORAL_TABLET | Freq: Every day | ORAL | Status: DC
  Administered 2018-02-27 – 2018-02-28 (×2): 40 mg via ORAL
  Filled 2018-02-27 (×2): qty 1

## 2018-02-27 MED ORDER — HYDROCODONE-HOMATROPINE 5-1.5 MG/5ML PO SYRP
5.0000 mL | ORAL_SOLUTION | Freq: Four times a day (QID) | ORAL | Status: DC | PRN
  Administered 2018-02-27 – 2018-03-01 (×6): 5 mL via ORAL
  Filled 2018-02-27 (×6): qty 5

## 2018-02-27 MED ORDER — TRAZODONE HCL 50 MG PO TABS
50.0000 mg | ORAL_TABLET | Freq: Once | ORAL | Status: AC
  Administered 2018-02-27: 50 mg via ORAL
  Filled 2018-02-27: qty 1

## 2018-02-27 MED ORDER — ONDANSETRON HCL 4 MG/2ML IJ SOLN: 4.0000 mg | Freq: Four times a day (QID) | INTRAMUSCULAR | Status: DC | PRN

## 2018-02-27 MED ORDER — SERTRALINE HCL 100 MG PO TABS: 100.0000 mg | ORAL_TABLET | Freq: Every day | ORAL | Status: DC

## 2018-02-27 MED ORDER — POTASSIUM CHLORIDE CRYS ER 20 MEQ PO TBCR
40.0000 meq | EXTENDED_RELEASE_TABLET | ORAL | Status: AC
  Administered 2018-02-27: 40 meq via ORAL
  Filled 2018-02-27: qty 2

## 2018-02-27 MED ORDER — INFLUENZA VAC SPLIT QUAD 0.5 ML IM SUSY
0.5000 mL | PREFILLED_SYRINGE | INTRAMUSCULAR | Status: AC
  Administered 2018-03-01: 0.5 mL via INTRAMUSCULAR
  Filled 2018-02-27: qty 0.5

## 2018-02-27 MED ORDER — ENOXAPARIN SODIUM 60 MG/0.6ML ~~LOC~~ SOLN
60.0000 mg | SUBCUTANEOUS | Status: DC
  Administered 2018-02-27 – 2018-03-01 (×3): 60 mg via SUBCUTANEOUS
  Filled 2018-02-27 (×3): qty 0.6

## 2018-02-27 MED ORDER — FUROSEMIDE 10 MG/ML IJ SOLN
40.0000 mg | Freq: Two times a day (BID) | INTRAMUSCULAR | Status: DC
  Administered 2018-02-27 – 2018-03-01 (×4): 40 mg via INTRAVENOUS
  Filled 2018-02-27 (×4): qty 4

## 2018-02-27 MED ORDER — PREDNISONE 20 MG PO TABS
40.0000 mg | ORAL_TABLET | Freq: Every day | ORAL | Status: DC
  Administered 2018-02-27 – 2018-03-01 (×3): 40 mg via ORAL
  Filled 2018-02-27 (×4): qty 2

## 2018-02-27 MED ORDER — TIZANIDINE HCL 4 MG PO TABS: 4.0000 mg | ORAL_TABLET | Freq: Every day | ORAL | Status: DC

## 2018-02-27 MED ORDER — GUAIFENESIN ER 600 MG PO TB12
600.0000 mg | ORAL_TABLET | Freq: Two times a day (BID) | ORAL | Status: DC
  Administered 2018-02-27 (×2): 600 mg via ORAL
  Filled 2018-02-27 (×2): qty 1

## 2018-02-27 MED ORDER — BUTALBITAL-APAP-CAFFEINE 50-325-40 MG PO TABS
1.0000 | ORAL_TABLET | Freq: Four times a day (QID) | ORAL | Status: DC | PRN
  Administered 2018-02-27: 1 via ORAL
  Filled 2018-02-27: qty 1

## 2018-02-27 MED ORDER — SODIUM CHLORIDE 0.9% FLUSH: 3.0000 mL | INTRAVENOUS | Status: DC | PRN

## 2018-02-27 MED ORDER — FUROSEMIDE 10 MG/ML IJ SOLN
40.0000 mg | INTRAMUSCULAR | Status: AC
  Administered 2018-02-27: 40 mg via INTRAVENOUS
  Filled 2018-02-27: qty 4

## 2018-02-27 MED ORDER — FUROSEMIDE 10 MG/ML IJ SOLN: 40.0000 mg | Freq: Once | INTRAMUSCULAR | Status: DC

## 2018-02-27 NOTE — Progress Notes (Signed)
  Echocardiogram 2D Echocardiogram has been performed.  Kelsey Morgan 02/27/2018, 10:12 AM

## 2018-02-27 NOTE — ED Notes (Signed)
ED TO INPATIENT HANDOFF REPORT  Name/Age/Gender Kelsey Morgan 47 y.o. female  Code Status    Code Status Orders  (From admission, onward)         Start     Ordered   02/27/18 0353  Full code  Continuous     02/27/18 0356        Code Status History    Date Active Date Inactive Code Status Order ID Comments User Context   10/18/2016 0529 10/18/2016 1845 Full Code [id number]  Rise Patience, MD Inpatient      Home/SNF/Other Home  Chief Complaint Shorntess of Breath  Level of Care/Admitting Diagnosis ED Disposition    ED Disposition Condition [place]: [facility] [[id number]]  Level of Care: Telemetry [5]  Admit to tele based on following criteria: Acute CHF  Diagnosis: Acute exacerbation of CHF (congestive heart failure) [facility]) [[id number]]  Admitting Physician: Norval Morton [[id number]]  Attending Physician: Norval Morton [[id number]]  Estimated length of stay: past midnight tomorrow  Certification:: I certify this patient will need inpatient services for at least 2 midnights  PT Class (Do Not Modify): Inpatient [101]  PT Acc Code (Do Not Modify): Private [1]       Medical History Past Medical History:  Diagnosis Date  . Arthritis   . Back pain   . Bronchitis   . Gout   . Pneumonia     Allergies Allergies  Allergen Reactions  . Hydrocodone Itching  . Percocet [Oxycodone-Acetaminophen] Itching  . Vicodin [Hydrocodone-Acetaminophen] Itching    Tolerates with Benadryl. Tolerates acetaminophen alone.    IV Location/Drains/Wounds Patient Lines/Drains/Airways Status   Active Line/Drains/Airways    Name:   Placement date:   Placement time:   Site:   Days:   Peripheral IV 02/27/18 Left Forearm   02/27/18    0228    Forearm   less than 1          Labs/Imaging Results for orders placed or performed during the hospital encounter of 02/26/18 (from the past 48 hour(s))  Basic metabolic panel     Status: Abnormal    Collection Time: 02/26/18  9:36 PM  Result Value Ref Range   Sodium 141 135 - 145 mmol/L   Potassium 3.4 (L) 3.5 - 5.1 mmol/L   Chloride 108 98 - 111 mmol/L   CO2 22 22 - 32 mmol/L   Glucose, Bld 124 (H) 70 - 99 mg/dL   BUN 7 6 - 20 mg/dL   Creatinine, Ser 1.02 (H) 0.44 - 1.00 mg/dL   Calcium 8.9 8.9 - 10.3 mg/dL   GFR calc non Af Amer >60 >60 mL/min   GFR calc Af Amer >60 >60 mL/min    Comment: (NOTE) The eGFR has been calculated using the CKD EPI equation. This calculation has not been validated in all clinical situations. eGFR's persistently <60 mL/min signify possible Chronic Kidney Disease.    Anion gap 11 5 - 15    Comment: Performed at [facility], [place] [address], [place], [place] [address]  CBC     Status: Abnormal   Collection Time: 02/26/18  9:36 PM  Result Value Ref Range   WBC 18.4 (H) 4.0 - 10.5 K/uL   RBC 5.06 3.87 - 5.11 MIL/uL   Hemoglobin 13.9 12.0 - 15.0 g/dL   HCT 42.8 36.0 - 46.0 %   MCV 84.6 78.0 - 100.0 fL   MCH 27.5 26.0 - 34.0  pg   MCHC 32.5 30.0 - 36.0 g/dL   RDW 14.6 11.5 - 15.5 %   Platelets 367 150 - 400 K/uL    Comment: Performed at [facility], [place] [address], [place], [place] [address]  Brain natriuretic peptide     Status: None   Collection Time: 02/26/18  9:36 PM  Result Value Ref Range   B Natriuretic Peptide 40.3 0.0 - 100.0 pg/mL    Comment: Performed at [facility], [place] [address], [place], [place] [address]  POCT i-Stat troponin I     Status: None   Collection Time: 02/26/18  9:56 PM  Result Value Ref Range   Troponin i, poc 0.00 0.00 - 0.08 ng/mL   Comment 3            Comment: Due to the release kinetics of cTnI, a negative result within the first hours of the onset of symptoms does not rule out myocardial infarction with certainty. If myocardial infarction is still suspected, repeat the test at appropriate intervals.   Magnesium     Status: None   Collection Time:  02/27/18  2:04 AM  Result Value Ref Range   Magnesium 1.7 1.7 - 2.4 mg/dL    Comment: Performed at [facility], [place] [address], [place], [place] [address]  Blood gas, arterial     Status: Abnormal   Collection Time: 02/27/18  2:30 AM  Result Value Ref Range   FIO2 21.00    pH, Arterial 7.400 7.350 - 7.450   pCO2 arterial 38.0 32.0 - 48.0 mmHg   pO2, Arterial 54.2 (L) 83.0 - 108.0 mmHg   Bicarbonate 23.1 20.0 - 28.0 mmol/L   Acid-base deficit 0.9 0.0 - 2.0 mmol/L   O2 Saturation 87.3 %   Patient temperature 98.7    Collection site RADIAL    Drawn by [id number]    Sample type ARTERIAL DRAW    Allens test (pass/fail) PASS PASS    Comment: Performed at [facility] [place] [address] [place], [place] [address]  C-reactive protein     Status: Abnormal   Collection Time: 02/27/18  3:58 AM  Result Value Ref Range   CRP 4.6 (H) <1.0 mg/dL    Comment: Performed at [facility], [place] [address] [place], [place] [address]   Dg Chest 2 View  Result Date: 02/26/2018 CLINICAL DATA:  Chest congestion, body ache and fever EXAM: CHEST - 2 VIEW COMPARISON:  02/06/2018 FINDINGS: Crowding of interstitial lung markings due to low lung volumes. Mild pulmonary vascular congestion and interstitial edema. No alveolar consolidation, effusion or pneumothorax. Stable mild cardiomegaly. No aortic aneurysm. No acute osseous abnormality. IMPRESSION: Stable mild cardiomegaly with mild interstitial edema. Electronically Signed   By: Ashley Royalty M.D.   On: 02/26/2018 21:08    Pending Labs Unresulted Labs (From admission, onward)    Start     Ordered   02/28/18 8676  Basic metabolic panel  Daily,   R     02/27/18 0356   02/27/18 0355  Troponin I  Now then every 6 hours,   R     02/27/18 0356   02/27/18 0355  TSH  Once,   R     02/27/18 0356   02/27/18 0351  HIV antibody (Routine Testing)  Once,   R     02/27/18 0356   02/27/18 0345  Sedimentation rate   Add-on,   R     02/27/18  0344          Vitals/Pain Today's Vitals   02/26/18 2045 02/26/18 2354 02/27/18 0130 02/27/18 0132  BP:   (!) 147/80 (!) 147/80  Pulse:   81 79  Resp:    19  Temp:      TempSrc:      SpO2:  92% (!) 87% (!) 87%  Weight:      Height:      PainSc: 10-Worst pain ever       Isolation Precautions No active isolations  Medications Medications  atorvastatin (LIPITOR) tablet 40 mg (has no administration in time range)  sertraline (ZOLOFT) tablet 100 mg (has no administration in time range)  HYDROcodone-homatropine (HYCODAN) 5-1.5 MG/5ML syrup 5 mL (has no administration in time range)  fluticasone (FLONASE) 50 MCG/ACT nasal spray 2 spray (has no administration in time range)  tiZANidine (ZANAFLEX) tablet 4 mg (has no administration in time range)  sodium chloride flush (NS) 0.9 % injection 3 mL (has no administration in time range)  sodium chloride flush (NS) 0.9 % injection 3 mL (has no administration in time range)  0.9 %  sodium chloride infusion (has no administration in time range)  ondansetron (ZOFRAN) injection 4 mg (has no administration in time range)  potassium chloride SA (K-DUR,KLOR-CON) CR tablet 40 mEq (has no administration in time range)  furosemide (LASIX) injection 40 mg (has no administration in time range)  guaiFENesin (MUCINEX) 12 hr tablet 600 mg (has no administration in time range)  enoxaparin (LOVENOX) injection 60 mg (has no administration in time range)  ipratropium-albuterol (DUONEB) 0.5-2.5 (3) MG/3ML nebulizer solution 3 mL (has no administration in time range)  predniSONE (DELTASONE) tablet 40 mg (has no administration in time range)  albuterol (PROVENTIL) (2.5 MG/3ML) 0.083% nebulizer solution 5 mg (5 mg Nebulization Given 02/26/18 2113)  ipratropium (ATROVENT) nebulizer solution 0.5 mg (0.5 mg Nebulization Given 02/26/18 2354)  albuterol (PROVENTIL) (2.5 MG/3ML) 0.083% nebulizer solution 5 mg (5 mg Nebulization Given 02/26/18  2354)  dexamethasone (DECADRON) injection 10 mg (10 mg Intramuscular Given 02/26/18 2352)  HYDROcodone-homatropine (HYCODAN) 5-1.5 MG/5ML syrup 5 mL (5 mLs Oral Given 02/26/18 2353)  diphenhydrAMINE (BENADRYL) capsule 25 mg (25 mg Oral Given 02/26/18 2353)  magnesium sulfate IVPB 2 g 50 mL (0 g Intravenous Stopped 02/27/18 0318)  albuterol (PROVENTIL) (2.5 MG/3ML) 0.083% nebulizer solution 5 mg (5 mg Nebulization Given 02/27/18 0227)  ipratropium (ATROVENT) nebulizer solution 0.5 mg (0.5 mg Nebulization Given 02/27/18 0227)  furosemide (LASIX) injection 40 mg (40 mg Intravenous Given 02/27/18 0318)    Mobility walks

## 2018-02-27 NOTE — Progress Notes (Signed)
Lovenox per Pharmacy for DVT Prophylaxis    Pharmacy has been consulted from dosing enoxaparin (lovenox) in this patient for DVT prophylaxis.  The pharmacist has reviewed pertinent labs (Hgb _13.9__; PLT_367__), patient weight (_131__kg) and renal function (CrCl__>90_mL/min) and decided that enoxaparin 60__mg SQ Q24Hrs is appropriate for this patient.  The pharmacy department will sign off at this time.  Please reconsult pharmacy if status changes or for further issues.  Thank you  Luetta Nutting PharmD, BCPS  02/27/2018, 4:10 AM

## 2018-02-27 NOTE — H&P (Addendum)
History and Physical    Kelsey Morgan OZD:[id number] DOB: 1970/10/23 DOA: 02/26/2018  Referring MD/NP/PA: Antonietta Breach, PA-C PCP: Donald Prose, MD  Patient coming from: Home  Chief Complaint: Shortness of breath  I have personally briefly reviewed patient's old medical records in [place]   HPI: Kelsey Morgan is a 47 y.o. female with medical history significant of Hyperlipidemia, arthritis, back pain, depression, and GERD; who presents with complaints of shortness of breath for approximately last 3 weeks.  Patient works as a Education administrator.  She had come to the emergency department complaining of left leg pain and swelling on 9/6.  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  The CT angiogram showed bilateral hilar and peribronchial lymphadenopathy with uncertain etiology and shotty mediastinal lymph nodes with suspected mild interstitial pulmonary edema or crowding of the interstitium.  Patient had been given albuterol inhaler and she will follow-up with her primary care provider.  Patient reported having a productive cough and intermittent wheezing.  She was evaluated by her primary care provider on 9/16 and diagnosed with bronchitis given amoxicillin or Augmentin, cough medicine, and started on Lasix.  She had noticed that her weight was up to 292 pounds but normally she is around 270.  After taking the antibiotics and the Lasix she noted that her leg swelling have been improving and that her weight was down to around 286.  However, patient still complained of associated symptoms of orthopnea and shortness of breath with exertion.  She went to a walk-in clinic on 9/22, and was evaluated and thought to have signs of pneumonia for which she was given a Z-Pak.  Despite all these medication patient still persistently has a cough and is short of breath.  She reports a family history of her mother having heart disease  in her early 38s along with chronic kidney disease.  Patient also reports that she quit smoking approximately 1 month ago but previously smoked for 30 years usually less than a pack of cigarettes per day on average.  ED Course: Upon admission into the emergency department patient was seen to be afebrile, pulse 77-81, respiration 18-19, blood pressures elevated up to 153/66, and O2 saturations 87-93% on room air.  She was placed on 2 L nasal cannula oxygen.  Arterial blood gas revealed pH 7.4, PO2 54.2, PCO2 38.  Labs revealed WBC 18.4, potassium 3.4, BUN 7, creatinine 1.02, and BNP 40.3.  Chest x-ray showing mild stable cardiomegaly with interstitial edema.  Given 2 g of magnesium sulfate, 10 mg of Decadron IV, and DuoNeb breathing treatment.  TRH called to admit.  Review of Systems  Constitutional: Positive for malaise/fatigue. Negative for chills and fever.  HENT: Negative for ear discharge and nosebleeds.   Eyes: Negative for photophobia and discharge.  Respiratory: Positive for cough, sputum production and shortness of breath.   Cardiovascular: Positive for orthopnea, leg swelling and PND. Negative for chest pain.  Gastrointestinal: Negative for abdominal pain, diarrhea and vomiting.  Genitourinary: Negative for dysuria and hematuria.  Musculoskeletal: Positive for myalgias. Negative for falls.  Skin: Negative for itching and rash.  Neurological: Negative for loss of consciousness and weakness.  Psychiatric/Behavioral: Negative for substance abuse. The patient is not nervous/anxious.     Past Medical History:  Diagnosis Date  . Arthritis   . Back pain   . Bronchitis   . Gout   . Pneumonia  Past Surgical History:  Procedure Laterality Date  . ABDOMINAL HYSTERECTOMY    . ANKLE SURGERY    . CHOLECYSTECTOMY    . EXCISION MASS NECK N/A 11/12/2017   Procedure: EXCISION POSTERIOR  NECK CYST;  Surgeon: Ralene Ok, MD;  Location: [facility];  Service: General;  Laterality: N/A;  .  FOOT SURGERY    . FRACTURE SURGERY    . KNEE SURGERY       reports that she has been smoking cigarettes. She has a 6.25 pack-year smoking history. She has never used smokeless tobacco. She reports that she does not drink alcohol or use drugs.  Allergies  Allergen Reactions  . Hydrocodone Itching  . Percocet [Oxycodone-Acetaminophen] Itching  . Vicodin [Hydrocodone-Acetaminophen] Itching    Tolerates with Benadryl. Tolerates acetaminophen alone.    Family History  Problem Relation Age of Onset  . Hypertension Mother   . Kidney failure Mother   . Heart disease Mother        Onset in her 2s    Prior to Admission medications   Medication Sig Start Date End Date Taking? Authorizing Provider  albuterol (PROVENTIL) (2.5 MG/3ML) 0.083% nebulizer solution Inhale 3 mLs into the lungs every 4 (four) hours as needed for shortness of breath.  02/22/18  Yes [provider]  atorvastatin (LIPITOR) 40 MG tablet Take 1 tablet (40 mg total) by mouth daily at 6 PM. Patient taking differently: Take 40 mg by mouth at bedtime.  10/19/16  Yes Isaac Bliss, Rayford Halsted, MD  Dextromethorphan-guaiFENesin [facility] DM) 30-600 MG TB12 Take 1 tablet by mouth 2 (two) times daily.   Yes [provider]  fluticasone (FLONASE) 50 MCG/ACT nasal spray Place 2 sprays into both nostrils daily.   Yes [provider]  furosemide (LASIX) 20 MG tablet Take 20 mg by mouth daily. 02/16/18  Yes [provider]  guaiFENesin-dextromethorphan (ROBITUSSIN DM) 100-10 MG/5ML syrup Take 5 mLs by mouth every 4 (four) hours as needed for cough.   Yes [provider]  HYDROMET 5-1.5 MG/5ML syrup Take 5 mLs by mouth every 6 (six) hours as needed for cough.  02/22/18  Yes [provider]  ibuprofen (ADVIL,MOTRIN) 800 MG tablet Take 1 tablet (800 mg total) by mouth 3 (three) times daily. 02/06/18  Yes Domenic Moras, PA-C  Multiple Vitamin (MULTIVITAMIN WITH MINERALS) TABS tablet Take 1 tablet  by mouth daily.   Yes [provider]  pseudoephedrine-acetaminophen (TYLENOL SINUS) 30-500 MG TABS tablet Take 1 tablet by mouth every morning.   Yes [provider]  ranitidine (ZANTAC) 150 MG tablet Take 150 mg by mouth 2 (two) times daily as needed for heartburn.    Yes [provider]  sertraline (ZOLOFT) 100 MG tablet Take 100 mg by mouth at bedtime.    Yes [provider]  tiZANidine (ZANAFLEX) 4 MG tablet Take 4 mg by mouth at bedtime.  12/01/15  Yes [provider]  traMADol (ULTRAM) 50 MG tablet Take 1 tablet (50 mg total) by mouth every 6 (six) hours as needed. Patient not taking: Reported on 02/06/2018 11/12/17 11/12/18  Ralene Ok, MD    Physical Exam:  Constitutional: Obese female in no acute distress at this time. Vitals:   02/26/18 2040 02/26/18 2354 02/27/18 0130 02/27/18 0132  BP: (!) 153/66  (!) 147/80 (!) 147/80  Pulse: 77  81 79  Resp: 18   19  Temp: 98.7 F (37.1 C)     TempSrc: Oral     SpO2:  93% 92% (!) 87% (!) 87%  Weight: 131.1 kg     Height: 5' 6"  (1.676 m)      Eyes: PERRL, lids and conjunctivae normal ENMT: Mucous membranes are moist. Posterior pharynx clear of any exudate or lesions. Normal dentition.  Neck: normal, supple, no masses, no thyromegaly Respiratory: Positive expiratory wheezes with crackles appreciated.  Patient currently on nasal cannula oxygen 2 L able to talk in complete sentences. Cardiovascular: Regular rate and rhythm, no murmurs / rubs / gallops.  +1 pitting lower extremity edema. 2+ pedal pulses. No carotid bruits.   Abdomen: no tenderness, no masses palpated. No hepatosplenomegaly. Bowel sounds positive.  Musculoskeletal: no clubbing / cyanosis. No joint deformity upper and lower extremities. Good ROM, no contractures. Normal muscle tone.  Skin: no rashes, lesions, ulcers. No induration Neurologic: CN 2-12 grossly intact. Sensation intact, DTR normal. Strength 5/5 in all 4.  Psychiatric:  Normal judgment and insight. Alert and oriented x 3. Normal mood.     Labs on Admission: I have personally reviewed following labs and imaging studies  CBC: Recent Labs  Lab 02/26/18 2136  WBC 18.4*  HGB 13.9  HCT 42.8  MCV 84.6  PLT 614   Basic Metabolic Panel: Recent Labs  Lab 02/26/18 2136 02/27/18 0204  NA 141  --   K 3.4*  --   CL 108  --   CO2 22  --   GLUCOSE 124*  --   BUN 7  --   CREATININE 1.02*  --   CALCIUM 8.9  --   MG  --  1.7   GFR: Estimated Creatinine Clearance: 93.7 mL/min (A) (by C-G formula based on SCr of 1.02 mg/dL (H)). Liver Function Tests: No results for input(s): AST, ALT, ALKPHOS, BILITOT, PROT, ALBUMIN in the last 168 hours. No results for input(s): LIPASE, AMYLASE in the last 168 hours. No results for input(s): AMMONIA in the last 168 hours. Coagulation Profile: No results for input(s): INR, PROTIME in the last 168 hours. Cardiac Enzymes: No results for input(s): CKTOTAL, CKMB, CKMBINDEX, TROPONINI in the last 168 hours. BNP (last 3 results) No results for input(s): PROBNP in the last 8760 hours. HbA1C: No results for input(s): HGBA1C in the last 72 hours. CBG: No results for input(s): GLUCAP in the last 168 hours. Lipid Profile: No results for input(s): CHOL, HDL, LDLCALC, TRIG, CHOLHDL, LDLDIRECT in the last 72 hours. Thyroid Function Tests: No results for input(s): TSH, T4TOTAL, FREET4, T3FREE, THYROIDAB in the last 72 hours. Anemia Panel: No results for input(s): VITAMINB12, FOLATE, FERRITIN, TIBC, IRON, RETICCTPCT in the last 72 hours. Urine analysis:    Component Value Date/Time   COLORURINE YELLOW 04/06/2017 0946   APPEARANCEUR CLEAR 04/06/2017 0946   LABSPEC 1.010 04/06/2017 0946   PHURINE 6.0 04/06/2017 0946   GLUCOSEU NEGATIVE 04/06/2017 0946   HGBUR NEGATIVE 04/06/2017 0946   BILIRUBINUR NEGATIVE 04/06/2017 0946   KETONESUR NEGATIVE 04/06/2017 0946   PROTEINUR NEGATIVE 04/06/2017 0946   UROBILINOGEN 0.2  12/03/2014 1850   NITRITE NEGATIVE 04/06/2017 0946   LEUKOCYTESUR NEGATIVE 04/06/2017 0946   Sepsis Labs: No results found for this or any previous visit (from the past 240 hour(s)).   Radiological Exams on Admission: Dg Chest 2 View  Result Date: 02/26/2018 CLINICAL DATA:  Chest congestion, body ache and fever EXAM: CHEST - 2 VIEW COMPARISON:  02/06/2018 FINDINGS: Crowding of interstitial lung markings due to low lung volumes. Mild pulmonary vascular congestion and interstitial edema. No alveolar consolidation, effusion or pneumothorax.  Stable mild cardiomegaly. No aortic aneurysm. No acute osseous abnormality. IMPRESSION: Stable mild cardiomegaly with mild interstitial edema. Electronically Signed   By: Ashley Royalty M.D.   On: 02/26/2018 21:08    EKG: Independently reviewed.  Sinus rhythm at 71 bpm with QTc 557  Assessment/Plan Acute respiratory failure with hypoxia, suspected diastolic CHF exacerbation: Patient presents with progressively worsening shortness of breath.  Last echocardiogram noted EF of 55 to 60% with grade 1 diastolic dysfunction and 02/7988.  Patient previously noted to be negative for signs of DVT/PE earlier this month. - Admit to a telemetry bed - Heart failure orders set  initiated  - Continuous pulse oximetry with nasal cannula oxygen as needed to keep O2 saturations >92% - Strict I&Os and daily weights - Elevate lower extremities - Lasix 40 mg IV Bid - Reassess in a.m. and adjust diuresis as needed. - Check echocardiogram - Optimize medical management as able - Message sent for consultation to cardiology in a.m.   Leukocytosis: Acute on chronic.  WBC elevated 18.4 on admission.  Patient with a long history of leukocytosis. - May warrant further investigation or consult to hematology/oncology  Prolonged QT interval: Acute. Initial QT interval noted to be 557 on admission. - Recheck EKG this a.m. - Correct electrolyte abnormalities - Holding QT prolonging  medications such as Zanaflex, Zoloft, and Zofran as needed  Bronchitis: Acute.  Patient with wheezing on physical exam and positive crackles.  Patient does not have a formal diagnosis of COPD.  Previous history of smoking for at least 30 years. - Prednisone 40 mg  - DuoNeb's 3 times daily for shortness of breath/wheezing  Bilateral hilar and peribronchiolar lymphadenopathy: Patient had recent CT angiogram of the chest on 9/6 showing bilateral hilar and peribronchial lymphadenopathy with uncertain cytology and shotty mediastinal lymph nodes with suspected mild interstitial pulmonary edema.  Unclear etiology of symptoms, but review of records shows a persistent leukocytosis. - Check ESR, CRP - May warrant pulmonology consult for possible need of biopsy  Hypokalemia: Acute. Initial potassium noted be 3.4 on admission. - Give 40 mEq of potassium chloride x1 dose now - Continue to monitor and replace as  History of tobacco abuse: Patient previously has a history of smoking up until 1 month ago when she quit.  Estimated 30 smoking pack years. - Encouraged continued cessation of smoking  DVT prophylaxis: Lovenox Code Status: Full Family Communication: No family present at bedside Disposition Plan: To be determined Consults called: None Admission status: Inpatient  Norval Morton MD [facility] Pager [phone]   If 7PM-7AM, please contact night-coverage [email or web address] Password [facility]  02/27/2018, 3:39 AM

## 2018-02-27 NOTE — Care Management Note (Signed)
Case Management Note  Patient Details  Name: Kelsey Morgan MRN: [id number] Date of Birth: September 13, 1970  Subjective/Objective: Pt admitted with CHF                   Action/Plan: Pt plan to discharge home with follow up appointments with PCP and Dr. Antoine Poche (Cards) Oct 21st. Pt understand CHF and repeated that she will weight herself daily.    Expected Discharge Date:                  Expected Discharge Plan:  Home/Self Care  In-House Referral:     Discharge planning Services  CM Consult, Other - See comment(CHF)  Post Acute Care Choice:    Choice offered to:     DME Arranged:    DME Agency:     HH Arranged:    HH Agency:     Status of Service:  Completed, signed off  If discussed at Long Length of Stay Meetings, dates discussed:    Additional CommentsGeni Bers, RN 02/27/2018, 12:48 PM

## 2018-02-27 NOTE — ED Notes (Signed)
Pt ambulated from room to around the department and back to the room 88%-84% return to the room

## 2018-02-27 NOTE — Consult Note (Addendum)
Cardiology Consultation:   Patient ID: DEVLYNN KNOFF; [id number]; [birth year]   Admit date: 02/26/2018 Date of Consult: 02/27/2018  Primary Care Provider: Deatra Jakita Dutkiewicz, MD Primary Cardiologist: Rollene Rotunda, MD  Azalee Course, [facility], 11/08/2016 Primary Electrophysiologist:  none   Patient Profile:   Kelsey Morgan is a 47 y.o. female with a hx of QTc>500, nl EF w/ grade 1 dd 11/2016, low risk MV 11/2016, who is being seen today for the evaluation of D-CHF at the request of Dr Katrinka Blazing.  History of Present Illness:   Kelsey Morgan has done well in general since last seen by cards.  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  Lower extremity edema 9/6, ER visit but no DVT. She then developed an upper respiratory infection, but was out at work and did not get back to [place] till 9/16. She was coughing and short of breath but no fevers.  She saw her PCP and got antibiotics and cough medicine. At follow-up appointment with PCP with no improvement, she got Lasix.  Symptoms still did not improve, so she went to a walk-in clinic yesterday.  When a breathing treatment did not help, they sent her to the emergency room. Initial oxygen saturation was 87% on room air.  BMP was normal, but chest x-ray showed mild edema.  She was given nebulizers, Decadron, Benadryl and symptoms began to improve.  Currently, she is still on O2, but O2 sats are 95%.  She does not add salt to food but admits that eating out all the time, there may be hidden salt in foods.  She has not had chest pain.    She has had some lower extremity edema, but mostly daytime.  She has had definite dyspnea on exertion and a rough productive cough.  She has orthopnea and possibly PND, she woke coughing.  Some fevers prior to admission, but those have resolved.  She definitely does not feel back to baseline, but is improved.   Past Medical History:  Diagnosis Date  . Arthritis   . Back pain   .  Bronchitis   . Gout   . Pneumonia     Past Surgical History:  Procedure Laterality Date  . ABDOMINAL HYSTERECTOMY    . ANKLE SURGERY    . CHOLECYSTECTOMY    . EXCISION MASS NECK N/A 11/12/2017   Procedure: EXCISION POSTERIOR  NECK CYST;  Surgeon: Axel Filler, MD;  Location: [facility] OR;  Service: General;  Laterality: N/A;  . FOOT SURGERY    . FRACTURE SURGERY    . KNEE SURGERY       Prior to Admission medications   Medication Sig Start Date End Date Taking? Authorizing Provider  albuterol (PROVENTIL) (2.5 MG/3ML) 0.083% nebulizer solution Inhale 3 mLs into the lungs every 4 (four) hours as needed for shortness of breath.  02/22/18  Yes [provider]  atorvastatin (LIPITOR) 40 MG tablet Take 1 tablet (40 mg total) by mouth daily at 6 PM. Patient taking differently: Take 40 mg by mouth at bedtime.  10/19/16  Yes Philip Aspen, Limmie Patricia, MD  Dextromethorphan-guaiFENesin [facility] DM) 30-600 MG TB12 Take 1 tablet by mouth 2 (two) times daily.   Yes [provider]  fluticasone (FLONASE) 50 MCG/ACT nasal spray Place 2 sprays into both nostrils daily.   Yes [provider]  furosemide (LASIX) 20 MG tablet Take 20 mg by mouth daily. 02/16/18  Yes [provider]  guaiFENesin-dextromethorphan (ROBITUSSIN DM)  100-10 MG/5ML syrup Take 5 mLs by mouth every 4 (four) hours as needed for cough.   Yes [provider]  HYDROMET 5-1.5 MG/5ML syrup Take 5 mLs by mouth every 6 (six) hours as needed for cough.  02/22/18  Yes [provider]  ibuprofen (ADVIL,MOTRIN) 800 MG tablet Take 1 tablet (800 mg total) by mouth 3 (three) times daily. 02/06/18  Yes Fayrene Helper, PA-C  Multiple Vitamin (MULTIVITAMIN WITH MINERALS) TABS tablet Take 1 tablet by mouth daily.   Yes [provider]  pseudoephedrine-acetaminophen (TYLENOL SINUS) 30-500 MG TABS tablet Take 1 tablet by mouth every morning.   Yes [provider]  ranitidine (ZANTAC) 150 MG  tablet Take 150 mg by mouth 2 (two) times daily as needed for heartburn.    Yes [provider]  sertraline (ZOLOFT) 100 MG tablet Take 100 mg by mouth at bedtime.    Yes [provider]  tiZANidine (ZANAFLEX) 4 MG tablet Take 4 mg by mouth at bedtime.  12/01/15  Yes [provider]  traMADol (ULTRAM) 50 MG tablet Take 1 tablet (50 mg total) by mouth every 6 (six) hours as needed. Patient not taking: Reported on 02/06/2018 11/12/17 11/12/18  Axel Filler, MD    Inpatient Medications: Scheduled Meds: . atorvastatin  40 mg Oral QHS  . enoxaparin (LOVENOX) injection  60 mg Subcutaneous Q24H  . fluticasone  2 spray Each Nare Daily  . furosemide  40 mg Intravenous BID  . guaiFENesin  600 mg Oral BID  . [START ON 02/28/2018] Influenza vac split quadrivalent PF  0.5 mL Intramuscular Tomorrow-1000  . ipratropium-albuterol  3 mL Nebulization TID  . [START ON 02/28/2018] pneumococcal 23 valent vaccine  0.5 mL Intramuscular Tomorrow-1000  . predniSONE  40 mg Oral Q breakfast  . sodium chloride flush  3 mL Intravenous Q12H   Continuous Infusions: . sodium chloride     PRN Meds: sodium chloride, HYDROcodone-homatropine, sodium chloride flush  Allergies:    Allergies  Allergen Reactions  . Hydrocodone Itching  . Percocet [Oxycodone-Acetaminophen] Itching  . Vicodin [Hydrocodone-Acetaminophen] Itching    Tolerates with Benadryl. Tolerates acetaminophen alone.    Social History:   Social History   Socioeconomic History  . Marital status: Single    Spouse name: Not on file  . Number of children: Not on file  . Years of education: Not on file  . Highest education level: Not on file  Occupational History  . Occupation: Truck Runner, broadcasting/film/video  . Financial resource strain: Not on file  . Food insecurity:    Worry: Not on file    Inability: Not on file  . Transportation needs:    Medical: Not on file    Non-medical: Not on file  Tobacco Use  . Smoking  status: Current Every Day Smoker    Packs/day: 0.25    Years: 25.00    Pack years: 6.25    Types: Cigarettes  . Smokeless tobacco: Never Used  Substance and Sexual Activity  . Alcohol use: No  . Drug use: No  . Sexual activity: Not Currently  Lifestyle  . Physical activity:    Days per week: Not on file    Minutes per session: Not on file  . Stress: Not on file  Relationships  . Social connections:    Talks on phone: Not on file    Gets together: Not on file    Attends religious service: Not on file    Active member of  club or organization: Not on file    Attends meetings of clubs or organizations: Not on file    Relationship status: Not on file  . Intimate partner violence:    Fear of current or ex partner: Not on file    Emotionally abused: Not on file    Physically abused: Not on file    Forced sexual activity: Not on file  Other Topics Concern  . Not on file  Social History Narrative   Lives with partner.      Family History:   Family History  Problem Relation Age of Onset  . Hypertension Mother   . Kidney failure Mother   . Heart disease Mother        Onset in her 63s   Family Status:  Family Status  Relation Name Status  . Mother  Alive  . Father  Alive    ROS:  Please see the history of present illness.  All other ROS reviewed and negative.     Physical Exam/Data:   Vitals:   02/27/18 0300 02/27/18 0330 02/27/18 0507 02/27/18 0824  BP: (!) 136/53 (!) 156/81 133/67   Pulse: 84 81 81   Resp:   20   Temp:   98.8 F (37.1 C)   TempSrc:   Oral   SpO2: 94% 93% 95% 93%  Weight:      Height:        Intake/Output Summary (Last 24 hours) at 02/27/2018 0846 Last data filed at 02/27/2018 0318 Gross per 24 hour  Intake 50 ml  Output -  Net 50 ml   Filed Weights   02/26/18 2040 02/27/18 0227  Weight: 131.1 kg 126.3 kg   Body mass index is 44.28 kg/m.  General:  Well nourished, well developed, in no acute distress HEENT: normal Lymph: no  adenopathy Neck: no JVD seen but difficult to assess secondary to body habitus Endocrine:  No thryomegaly Vascular: No carotid bruits; 4/4 extremity pulses 2+, without bruits  Cardiac:  normal S1, S2; RRR; soft murmur  Lungs: Positive for rhonchi and rales  Abd: soft, nontender, no hepatomegaly  Ext: no edema Musculoskeletal:  No deformities, BUE and BLE strength normal and equal Skin: warm and dry  Neuro:  CNs 2-12 intact, no focal abnormalities noted Psych:  Normal affect   EKG:  The EKG was personally reviewed and demonstrates: 9/27 ECG, sinus rhythm, QT/QTc 418/482 ms Admit ECG 9/26 sinus rhythm, PACs noted, QT/QTc 512/557 milliseconds, my reading is shorter. 11/03/2017 ECG, QT/QTc 398/423 ms QT/QTc from 10/2016 ECG 453/510 ms  Telemetry:  Telemetry was personally reviewed and demonstrates: Sinus rhythm  Relevant CV Studies:  ECHO: 10/18/2016 - Left ventricle: The cavity size was normal. Wall thickness was   normal. Systolic function was normal. The estimated ejection   fraction was in the range of 55% to 60%. Wall motion was normal;   there were no regional wall motion abnormalities. Doppler   parameters are consistent with abnormal left ventricular   relaxation (grade 1 diastolic dysfunction).  Impressions:  - Normal LV systolic function; mild diastolic dysfunction.   Laboratory Data:  Chemistry Recent Labs  Lab 02/26/18 2136  NA 141  K 3.4*  CL 108  CO2 22  GLUCOSE 124*  BUN 7  CREATININE 1.02*  CALCIUM 8.9  GFRNONAA >60  GFRAA >60  ANIONGAP 11    Lab Results  Component Value Date   ALT 21 10/18/2016   AST 20 10/18/2016   ALKPHOS 65 10/18/2016  BILITOT 0.6 10/18/2016   Hematology Recent Labs  Lab 02/26/18 2136 02/27/18 0647  WBC 18.4* 11.0*  RBC 5.06 5.04  HGB 13.9 13.8  HCT 42.8 42.4  MCV 84.6 84.1  MCH 27.5 27.4  MCHC 32.5 32.5  RDW 14.6 14.6  PLT 367 393   Cardiac Enzymes Recent Labs  Lab 02/27/18 0647  TROPONINI 0.05*      Recent Labs  Lab 02/26/18 2156  TROPIPOC 0.00    BNP Recent Labs  Lab 02/26/18 2136  BNP 40.3    TSH:  Lab Results  Component Value Date   TSH 0.898 02/27/2018   Lipids: Lab Results  Component Value Date   CHOL 129 11/08/2016   HDL 36 (L) 11/08/2016   LDLCALC 61 11/08/2016   TRIG 162 (H) 11/08/2016   CHOLHDL 3.6 11/08/2016   HgbA1c:No results found for: HGBA1C Magnesium:  Magnesium  Date Value Ref Range Status  02/27/2018 1.7 1.7 - 2.4 mg/dL Final    Comment:    Performed at [facility], [address] [place], [place] [address]     Radiology/Studies:  Dg Chest 2 View  Result Date: 02/26/2018 CLINICAL DATA:  Chest congestion, body ache and fever EXAM: CHEST - 2 VIEW COMPARISON:  02/06/2018 FINDINGS: Crowding of interstitial lung markings due to low lung volumes. Mild pulmonary vascular congestion and interstitial edema. No alveolar consolidation, effusion or pneumothorax. Stable mild cardiomegaly. No aortic aneurysm. No acute osseous abnormality. IMPRESSION: Stable mild cardiomegaly with mild interstitial edema. Electronically Signed   By: Tollie Eth M.D.   On: 02/26/2018 21:08    Assessment and Plan:   1.  Acute diastolic CHF - She has not had a problem with volume overload chronically, but her intrinsic sodium consumption is very high. -She will pay more attention to the amount of sodium in foods and try to make better choices when traveling. - Volume overload by exam is not severe, should be able to change to p.o. Lasix later today or tomorrow - She would benefit from a low-dose of daily Lasix -She already has a follow-up appointment with Dr. Antoine Poche -Supplement potassium  2.  Prolonged QT/QTc: -My measurement of her QT/QTc is normal, consistent with the ECG from today. - Her T waves are small chronically and difficult to see so that may lead to some inaccuracies with the machine read. -No significant ectopy  Otherwise, per IM,  suspect she has a component of asthmatic bronchitis Principal Problem:   Acute exacerbation of CHF (congestive heart failure) (HCC) Active Problems:   Leucocytosis   Prolonged QT interval   Bronchitis   Lymphadenopathy, hilar   Hypokalemia     For questions or updates, please contact [facility] Please consult [email or web address] for contact info under Cardiology/STEMI.   Signed, Theodore Demark, PA-C  02/27/2018 8:46 AM  History and all data above reviewed.  Patient examined.  I agree with the findings as above.  The patient has had a few days of increased dyspnea not responding to treatment for URI.  She does feel better after diuresis.  BNP was CXR with mild edema.  I did look at the echo just done and she has well preserved EF without a particularly thick LV wall.   She has had only grade 1 diastolic dysfunction previously.   The patient exam reveals COR:RRR  ,  Lungs: Decreased breath sounds slightly at the bases.  No crackles  ,  Abd: Obese, Ext No edema  .  All available labs,  radiology testing, previous records reviewed. Agree with documented assessment and plan. Acute SOB:  I do think that there is an element of diastolic dysfunction and she and I talked about this.  I do not think that there is an infiltrative process although she does have some lymphadenopathy and further evaluation of this per the primary team is warranted.  Lifestyle and weight loss are critical and we discussed strategies again today.  Agree with IV Lasix today and then would increase home PO to 40 mg daily.   Slightly elevated troponin is non specific.    Fayrene Fearing Cristol Engdahl  12:14 PM  02/27/2018

## 2018-02-27 NOTE — Progress Notes (Signed)
The patient was admitted early this morning after midnight and H&P has been reviewed and I am in current agreement with assessment and plan done by Dr. Shearon Balo.  Additional changes to the plan of care been made accordingly.  Patient is a 47 year old morbidly obese African-American female with past medical history significant for hypertension, hyperlipidemia, arthritis, back pain, depression, GERD and other comorbidities including chronic diastolic CHF who presented to the emergency room with chief complaint of shortness of breath for the last 3 weeks.  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  She presented to the emergency room on 9 6 at that time she was evaluated for DVT and PE given her cardiac history with a CT and CTA showed bilateral hilar and peribronchial lymphadenopathy without clear etiology and shotty mediastinal lymph nodes with suspected mild interstitial pulmonary edema and crowding of the interstitium.  She was discharged diagnosis of bronchitis and told to follow-up with her primary care provider and given amoxicillin however because symptoms persisted and changed her to Augmentin.  After taking the antibiotics and Lasix that was provided to her by her PCP her leg swelling improved and her weight was down however she still complained of shortness of breath so then she subsequently went to a walk-in clinic and was evaluated and thought to have a pneumonia and given azithromycin.  Symptoms were still persistent so she presented to [facility] for further evaluation recommendations.  Patient did smoke but quit approximately 1 month ago.  She was diagnosed with acute respiratory failure with hypoxia likely secondary to a diastolic CHF exacerbation and cardiology was consulted and patient was started on diuresis and echocardiogram was checked.  I ran the case by the pulmonary PA who recommends diuresing the patient and  drying her out before repeating a CT scan and patient is to follow-up with pulmonary in the outpatient setting for further evaluation for her bilateral hilar and peribronchial lymphadenopathy.  At this time we will continue to monitor patient's clinical response to intervention and repeat blood work in the a.m.

## 2018-02-28 ENCOUNTER — Inpatient Hospital Stay (HOSPITAL_COMMUNITY): Payer: Managed Care, Other (non HMO)

## 2018-02-28 DIAGNOSIS — J40 Bronchitis, not specified as acute or chronic: Secondary | ICD-10-CM

## 2018-02-28 DIAGNOSIS — I5033 Acute on chronic diastolic (congestive) heart failure: Secondary | ICD-10-CM

## 2018-02-28 DIAGNOSIS — R9431 Abnormal electrocardiogram [ECG] [EKG]: Secondary | ICD-10-CM

## 2018-02-28 LAB — COMPREHENSIVE METABOLIC PANEL
ALBUMIN: 3.5 g/dL (ref 3.5–5.0)
ALT: 20 U/L (ref 0–44)
ANION GAP: 8 (ref 5–15)
AST: 31 U/L (ref 15–41)
Alkaline Phosphatase: 55 U/L (ref 38–126)
BUN: 11 mg/dL (ref 6–20)
CHLORIDE: 108 mmol/L (ref 98–111)
CO2: 24 mmol/L (ref 22–32)
Calcium: 9 mg/dL (ref 8.9–10.3)
Creatinine, Ser: 0.88 mg/dL (ref 0.44–1.00)
GFR calc non Af Amer: >60 mL/min (ref >60)
GLUCOSE: 149 mg/dL — AB (ref 70–99)
POTASSIUM: 3.7 mmol/L (ref 3.5–5.1)
SODIUM: 140 mmol/L (ref 135–145)
Total Bilirubin: 0.5 mg/dL (ref 0.3–1.2)
Total Protein: 7.4 g/dL (ref 6.5–8.1)

## 2018-02-28 LAB — CBC WITH DIFFERENTIAL/PLATELET
BASOS PCT: 0 %
Basophils Absolute: 0 10*3/uL (ref 0.0–0.1)
EOS ABS: 0 10*3/uL (ref 0.0–0.7)
EOS PCT: 0 %
HCT: 41.2 % (ref 36.0–46.0)
HEMOGLOBIN: 13.3 g/dL (ref 12.0–15.0)
Lymphocytes Relative: 10 %
Lymphs Abs: 1.6 10*3/uL (ref 0.7–4.0)
MCH: 27.1 pg (ref 26.0–34.0)
MCHC: 32.3 g/dL (ref 30.0–36.0)
MCV: 84.1 fL (ref 78.0–100.0)
MONO ABS: 1.2 10*3/uL — AB (ref 0.1–1.0)
MONOS PCT: 8 %
Neutro Abs: 12.5 10*3/uL — ABNORMAL HIGH (ref 1.7–7.7)
Neutrophils Relative %: 82 %
PLATELETS: 391 10*3/uL (ref 150–400)
RBC: 4.9 MIL/uL (ref 3.87–5.11)
RDW: 14.8 % (ref 11.5–15.5)
WBC: 15.2 10*3/uL — ABNORMAL HIGH (ref 4.0–10.5)

## 2018-02-28 LAB — MAGNESIUM: Magnesium: 2.1 mg/dL (ref 1.7–2.4)

## 2018-02-28 LAB — PHOSPHORUS: PHOSPHORUS: 2.3 mg/dL — AB (ref 2.5–4.6)

## 2018-02-28 MED ORDER — LIDOCAINE 5 % EX PTCH
1.0000 | MEDICATED_PATCH | CUTANEOUS | Status: DC
  Administered 2018-02-28 – 2018-03-01 (×2): 1 via TRANSDERMAL
  Filled 2018-02-28 (×2): qty 1

## 2018-02-28 MED ORDER — GUAIFENESIN ER 600 MG PO TB12
1200.0000 mg | ORAL_TABLET | Freq: Two times a day (BID) | ORAL | Status: DC
  Administered 2018-02-28 – 2018-03-01 (×3): 1200 mg via ORAL
  Filled 2018-02-28 (×3): qty 2

## 2018-02-28 MED ORDER — K PHOS MONO-SOD PHOS DI & MONO 155-852-130 MG PO TABS
500.0000 mg | ORAL_TABLET | Freq: Two times a day (BID) | ORAL | Status: AC
  Administered 2018-02-28 (×2): 500 mg via ORAL
  Filled 2018-02-28 (×2): qty 2

## 2018-02-28 MED ORDER — TRAZODONE HCL 50 MG PO TABS
50.0000 mg | ORAL_TABLET | Freq: Once | ORAL | Status: AC
  Administered 2018-02-28: 50 mg via ORAL
  Filled 2018-02-28: qty 1

## 2018-02-28 MED ORDER — ACETAMINOPHEN 325 MG PO TABS
650.0000 mg | ORAL_TABLET | Freq: Four times a day (QID) | ORAL | Status: DC | PRN
  Administered 2018-02-28 – 2018-03-01 (×2): 650 mg via ORAL
  Filled 2018-02-28 (×2): qty 2

## 2018-02-28 MED ORDER — POTASSIUM CHLORIDE CRYS ER 20 MEQ PO TBCR
40.0000 meq | EXTENDED_RELEASE_TABLET | Freq: Once | ORAL | Status: AC
  Administered 2018-02-28: 40 meq via ORAL
  Filled 2018-02-28: qty 2

## 2018-02-28 NOTE — Progress Notes (Addendum)
Pt ambulated 360 feet around 4 West unit, Oxygen sat 90 % on room air. SRP, RN.

## 2018-02-28 NOTE — Progress Notes (Signed)
Progress Note   Subjective   Doing well today, the patient denies CP or SOB.  No new concerns  Inpatient Medications    Scheduled Meds: . atorvastatin  40 mg Oral QHS  . enoxaparin (LOVENOX) injection  60 mg Subcutaneous Q24H  . fluticasone  2 spray Each Nare Daily  . furosemide  40 mg Intravenous BID  . guaiFENesin  600 mg Oral BID  . Influenza vac split quadrivalent PF  0.5 mL Intramuscular Tomorrow-1000  . ipratropium-albuterol  3 mL Nebulization TID  . pneumococcal 23 valent vaccine  0.5 mL Intramuscular Tomorrow-1000  . potassium chloride  40 mEq Oral Daily  . predniSONE  40 mg Oral Q breakfast  . sodium chloride flush  3 mL Intravenous Q12H   Continuous Infusions: . sodium chloride     PRN Meds: sodium chloride, butalbital-acetaminophen-caffeine, HYDROcodone-homatropine, sodium chloride flush   Vital Signs    Vitals:   02/27/18 1330 02/27/18 1928 02/27/18 2148 02/28/18 0527  BP:   138/70 136/72  Pulse:   75 68  Resp:   18 20  Temp:   99.4 F (37.4 C) 98.2 F (36.8 C)  TempSrc:   Oral Oral  SpO2: 93% 95% 98% 96%  Weight:    126.4 kg  Height:        Intake/Output Summary (Last 24 hours) at 02/28/2018 1610 Last data filed at 02/27/2018 2000 Gross per 24 hour  Intake 1400 ml  Output 1200 ml  Net 200 ml   Filed Weights   02/26/18 2040 02/27/18 0227 02/28/18 0527  Weight: 131.1 kg 126.3 kg 126.4 kg    Telemetry    sinus - Personally Reviewed  Physical Exam   GEN- The patient is obese appearing, alert and oriented x 3 today.   Head- normocephalic, atraumatic Eyes-  Sclera clear, conjunctiva pink Ears- hearing intact Oropharynx- clear Neck- supple, Lungs- decreased BS at bases, normal work of breathing Heart- Regular rate and rhythm  GI- soft, NT, ND, + BS Extremities- no clubbing, cyanosis, or edema  MS- no significant deformity or atrophy Skin- no rash or lesion Psych- euthymic mood, full affect Neuro- strength and sensation are  intact   Labs    Chemistry Recent Labs  Lab 02/26/18 2136 02/27/18 0925 02/28/18 0346  NA 141 142 140  K 3.4* 3.8 3.7  CL 108 109 108  CO2 22 21* 24  GLUCOSE 124* 257* 149*  BUN 7 7 11   CREATININE 1.02* 1.09* 0.88  CALCIUM 8.9 9.2 9.0  PROT  --  7.7 7.4  ALBUMIN  --  3.7 3.5  AST  --  42* 31  ALT  --  22 20  ALKPHOS  --  60 55  BILITOT  --  0.7 0.5  GFRNONAA >60 59* >60  GFRAA >60 >60 >60  ANIONGAP 11 12 8      Hematology Recent Labs  Lab 02/26/18 2136 02/27/18 0647 02/28/18 0346  WBC 18.4* 11.0* 15.2*  RBC 5.06 5.04 4.90  HGB 13.9 13.8 13.3  HCT 42.8 42.4 41.2  MCV 84.6 84.1 84.1  MCH 27.5 27.4 27.1  MCHC 32.5 32.5 32.3  RDW 14.6 14.6 14.8  PLT 367 393 391    Cardiac Enzymes Recent Labs  Lab 02/27/18 0647 02/27/18 0925 02/27/18 1540  TROPONINI 0.05* 0.05* 0.05*    Recent Labs  Lab 02/26/18 2156  TROPIPOC 0.00    Echo is reviewed by me this am   Patient Profile:   Kelsey Morgan is a  47 y.o. female with a hx of QTc>500, nl EF w/ grade 1 dd 11/2016, low risk MV 11/2016, who is being seen today for the evaluation of D-CHF at the request of Dr Katrinka Blazing.  Assessment & Plan    1.  Acute diastolic dysfunction Clinically improving Continue diuresis as tolerated Ultimately, compliance with sodium restriction will be very important Wean O2 as able  2. Prolonged qt asymptomatic Avoid qt prolonging medicines Will hold off on beta blockers given concerns for reactive airway disease Keep K > 3.9, Mg >1.9  3. Morbid obesity Body mass index is 44.29 kg/m. Likely the driving cause of her presentation of medical issues Ultimately, weight reduction will be an important part of her clinical therapy.  Will likely need another 24 hours of inpatient therapy while weaning O2.  Once ambulatory and feeling well off of O2, could go home with close outpatient follow-up with Drs Wynelle Link and [facility].  Cardiology to see as needed over the weekend  Hillis Range MD, [facility] 02/28/2018 8:07 AM
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
## 2018-03-01 ENCOUNTER — Inpatient Hospital Stay (HOSPITAL_COMMUNITY): Payer: Managed Care, Other (non HMO)

## 2018-03-01 LAB — COMPREHENSIVE METABOLIC PANEL
ALT: 19 U/L (ref 0–44)
AST: 23 U/L (ref 15–41)
Albumin: 3.3 g/dL — ABNORMAL LOW (ref 3.5–5.0)
Alkaline Phosphatase: 53 U/L (ref 38–126)
Anion gap: 9 (ref 5–15)
BUN: 15 mg/dL (ref 6–20)
CO2: 26 mmol/L (ref 22–32)
Calcium: 9.1 mg/dL (ref 8.9–10.3)
Chloride: 107 mmol/L (ref 98–111)
Creatinine, Ser: 0.92 mg/dL (ref 0.44–1.00)
GFR calc non Af Amer: >60 mL/min (ref >60)
Glucose, Bld: 119 mg/dL — ABNORMAL HIGH (ref 70–99)
Potassium: 3.8 mmol/L (ref 3.5–5.1)
SODIUM: 142 mmol/L (ref 135–145)
Total Bilirubin: 0.5 mg/dL (ref 0.3–1.2)
Total Protein: 6.9 g/dL (ref 6.5–8.1)

## 2018-03-01 LAB — CBC WITH DIFFERENTIAL/PLATELET
Basophils Absolute: 0 10*3/uL (ref 0.0–0.1)
Basophils Relative: 0 %
EOS ABS: 0 10*3/uL (ref 0.0–0.7)
EOS PCT: 0 %
HCT: 42.7 % (ref 36.0–46.0)
Hemoglobin: 13.7 g/dL (ref 12.0–15.0)
LYMPHS ABS: 3.7 10*3/uL (ref 0.7–4.0)
Lymphocytes Relative: 23 %
MCH: 27.3 pg (ref 26.0–34.0)
MCHC: 32.1 g/dL (ref 30.0–36.0)
MCV: 85.1 fL (ref 78.0–100.0)
MONOS PCT: 9 %
Monocytes Absolute: 1.5 10*3/uL — ABNORMAL HIGH (ref 0.1–1.0)
Neutro Abs: 10.8 10*3/uL — ABNORMAL HIGH (ref 1.7–7.7)
Neutrophils Relative %: 68 %
PLATELETS: 388 10*3/uL (ref 150–400)
RBC: 5.02 MIL/uL (ref 3.87–5.11)
RDW: 14.9 % (ref 11.5–15.5)
WBC: 16 10*3/uL — AB (ref 4.0–10.5)

## 2018-03-01 LAB — MAGNESIUM: Magnesium: 1.9 mg/dL (ref 1.7–2.4)

## 2018-03-01 LAB — PHOSPHORUS: PHOSPHORUS: 4 mg/dL (ref 2.5–4.6)

## 2018-03-01 MED ORDER — IPRATROPIUM-ALBUTEROL 0.5-2.5 (3) MG/3ML IN SOLN: 3.0000 mL | Freq: Two times a day (BID) | RESPIRATORY_TRACT | Status: DC

## 2018-03-01 MED ORDER — FUROSEMIDE 40 MG PO TABS: 40.0000 mg | ORAL_TABLET | Freq: Every day | ORAL | 0 refills | Status: DC

## 2018-03-01 MED ORDER — POTASSIUM CHLORIDE CRYS ER 20 MEQ PO TBCR: 20.0000 meq | EXTENDED_RELEASE_TABLET | Freq: Every day | ORAL | 0 refills | Status: DC

## 2018-03-01 MED ORDER — IPRATROPIUM-ALBUTEROL 0.5-2.5 (3) MG/3ML IN SOLN: 3.0000 mL | Freq: Four times a day (QID) | RESPIRATORY_TRACT | 0 refills | Status: DC | PRN

## 2018-03-01 MED ORDER — GUAIFENESIN ER 600 MG PO TB12: 600.0000 mg | ORAL_TABLET | Freq: Two times a day (BID) | ORAL | 0 refills | Status: AC

## 2018-03-01 MED ORDER — FUROSEMIDE 40 MG PO TABS
40.0000 mg | ORAL_TABLET | Freq: Every day | ORAL | Status: DC
  Administered 2018-03-01: 40 mg via ORAL
  Filled 2018-03-01: qty 1

## 2018-03-01 NOTE — Progress Notes (Signed)
SATURATION QUALIFICATIONS: (This note is used to comply with regulatory documentation for home oxygen)  Patient Saturations on Room Air at Rest = 95%  Patient Saturations on Room Air while Ambulating = 91%  Patient Saturations on 0 Liters of oxygen while Ambulating = 91%  Please briefly explain why patient needs home oxygen: Oxygen not needed.

## 2018-03-01 NOTE — Progress Notes (Signed)
Patient discharged home, discharge instructions given and explained to patient. No wound noted, skin intact. Patient denies any pain/distress, accompanied home by family.

## 2018-03-01 NOTE — Discharge Summary (Signed)
Physician Discharge Summary  Kelsey Morgan OJJ:[id number] DOB: 07/12/1970 DOA: 02/26/2018  PCP: Donald Prose, MD  Admit date: 02/26/2018 Discharge date: 03/01/2018  Admitted From: Home Disposition:  Home  Recommendations for Outpatient Follow-up:  1. Follow up with PCP in 1-2 weeks 2. Follow up with Cardiology as an outpatient 3. Follow up with Pulmonary as an outpatient on October 9th 4. Follow up with Hematology as an outpatient  5. Please obtain CMP/CBC, Mag, Phos in one week 6. Please follow up on the following pending results:  Home Health: No Equipment/Devices: None   Discharge Condition: Stable CODE STATUS: FULL CODE Diet recommendation: Heart Healthy Diet   Brief/Interim Summary: HPI per Dr. Fuller Plan on 02/27/18 Kelsey Morgan a 48 y.o.femalewith medical history significant ofHyperlipidemia, arthritis, back pain, depression, and GERD; who presents with complaints of shortness of breath for approximately last 3 weeks. Patient works as a Education administrator. She had come to the emergency department complaining of left leg pain and swelling on 9/6. 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 The CT angiogram showed bilateral hilar and peribronchial lymphadenopathy with uncertain etiology and shotty mediastinal lymph nodes with suspected mild interstitial pulmonary edema or crowding of the interstitium. Patient had been given albuterol inhaler and she will follow-up with her primary care provider. Patient reported having a productive cough and intermittent wheezing. She was evaluated by her primary care provider on 9/16 and diagnosed with bronchitis given amoxicillin or Augmentin, cough medicine, and started on Lasix. She had noticed that her weight was up to 292 pounds but normally she is around 270. After taking the antibiotics and the Lasix she noted that her leg swelling have been improving  and that her weight was down to around 286. However, patient still complained of associated symptoms of orthopnea and shortness of breath with exertion. She went to a walk-in clinic on 9/22,and was evaluated and thought to have signs of pneumonia for which she was given a Z-Pak. Despite all these medication patient still persistently has a cough and is short of breath. She reports a family history of her mother having heart disease in her early 67s along with chronic kidney disease. Patient also reports that she quit smoking approximately 1 month ago but previously smoked for 30 years usually less than a pack of cigarettes per day on average.  **Admitted for acute respiratory failure with hypoxia and acute diastolic CHF and is improving with diuresis.  Cardiology was consulted for further evaluation recommendations and recommended continued diuresis today and transition to p.o. Lasix IV today.  She does have some lymphadenopathy noted on her CT scan this will be worked up as an outpatient with pulmonary at discharge.  In addition she also have her leukocytosis worked up as an outpatient with hematology and I discussed the case with Dr. Marin Olp who states to make a referral to hematology clinic.  Patient was deemed medically stable to be discharged at this time as her respiratory status improved and she did not require any supplemental oxygen via nasal cannula at discharge.  She will need to follow-up with primary care physician, as well as Cardiology, Pulmonology, as well as Hematology in the outpatient setting.  Discharge Diagnoses:  Principal Problem:   Acute exacerbation of CHF (congestive heart failure) (HCC) Active Problems:   Leucocytosis   Prolonged QT interval   Bronchitis   Lymphadenopathy, hilar   Hypokalemia  Acute  Respiratory Failure with Hypoxia in the setting of volume overload from Acute Diastolic CHF, improved  -Improving with diuresis -Continued with supplemental oxygen via  nasal cannula and wean O2 as tolerated -Continuous pulse oximetry and maintain O2 saturations greater than 92% -Repeat chest x-ray pending official read -Home ambulatory screen done prior to discharge and patient did not require any Home O2  Acute Diastolic CHF Exacerbation, improved  -Strict I's and O's, daily weights,  -Troponins were elevated at 0.053 and likely demand ischemia in the setting of CHF -Continued IV Lasix 40 mg twice daily and transitioned to 40 mg p.o. daily per Cardiology recommendations -Echocardiogram May 2018 showed an EF of 55 to 60% with grade 1 diastolic CHF; repeat echo this visit showed an EF of 65 to 70% with normal wall motion and no regional wall motion abnormalities and did show grade 1 diastolic dysfunction with mild aortic valve stenosis -Continue with supplemental potassium with IV Lasix -I's and O's are not accurate however patient is down 12 pounds; Patient is -1600 mL  -Continue monitor volume status -Patient has a follow-up scheduled with Dr. Percival Spanish in outpatient setting -Leg elevation -As above -Recent CTA was negative for DVT and PE this month -Repeat chest x-ray yesterday morning showed pulmonary vascular congestion with slight interstitial edema but no evidence of any consolidation -This AM CXR personally reviewed and seemed improved slightly   Leukocytosis -Acute on chronic -Patient WBC was 18.4 on admission and then trended down to 11 is now back up to 16.0  -Patient has a long history of leukocytosis likely in the setting of Tobacco Abuse  -We will need further investigation as an outpatient with Hematology and an Ambulatory Referral has been made  Prolonged QT -QT was 557 on admission -Repeat EKG showed improving QTC -Continue to correct electrolyte abnormalities as needed -Continue to hold prolonged QT medication such as Zanaflex, Zoloft and Zofran as needed next -Etiology following and recommending holding off beta-blockers given  concerns reactive airway disease -Recommending keeping potassium greater than 3.9 and magnesium greater than 1.9  Recent Bronchitis -Patient was wheezing on physical exam on crackles on admission; Wheezing has significantly improved  -Does not have a formal diagnosis of COPD but has a smoking history of at least 30 years -Continued prednisone 40 mg p.o. daily for 3 days and will stop -Continue with scheduled duo nebs for wheezing and shortness of breath -Chest discomfort from coughing -We will use acetaminophen, heating pad, as well as lidocaine patch -C/w with incentive spirometer and flutter valve, as well as Guaifenesin 600 mg p.o. twice daily x3 days   Bilateral hilar and peribronchial lymphadenopathy -Recent CT scan of the chest on 9 6 showed bilateral hilar and peribronchial lymphadenopathy  -ESR and CRP are elevated -Discussed with pulmonary PA and will follow the patient as an outpatient and appointment scheduled for October 9th  Hypokalemia -Patient's potassium was 3.4 admission is improved now -Patient's potassium is now 3.8 -Continue to monitor and replete as necessary -Repeat CMP as an outpatient   History of Tobacco Abuse -Patient is a history of smoking will up until about a month ago when she quit -Has had estimated smoking 30 pack years -Continued cessation of smoking counseling as needed -Follow up with PCP   Hypophosphatemia -Patient's phosphorus level this morning is 4.0 -Replete with p.o. K-Phos Neutral 500 mg p.o. twice daily yesterday  -Continue monitor and replete phosphorus as necessary -Repeat phosphorus level in a.m.  Morbid Obesity -Estimated body mass index is  44.29 kg/m as calculated from the following:   Height as of this encounter: 5' 6.5" (1.689 m).   Weight as of this encounter: 126.4 kg. -Weight loss counseling given  Discharge Instructions  Discharge Instructions    Ambulatory referral to Hematology   Complete by:  As directed     Needs a referral to Hematology for persistently elevated WBC   Call MD for:  difficulty breathing, headache or visual disturbances   Complete by:  As directed    Call MD for:  extreme fatigue   Complete by:  As directed    Call MD for:  hives   Complete by:  As directed    Call MD for:  persistant dizziness or light-headedness   Complete by:  As directed    Call MD for:  persistant nausea and vomiting   Complete by:  As directed    Call MD for:  redness, tenderness, or signs of infection (pain, swelling, redness, odor or green/yellow discharge around incision site)   Complete by:  As directed    Call MD for:  severe uncontrolled pain   Complete by:  As directed    Call MD for:  temperature >100.4   Complete by:  As directed    Diet - low sodium heart healthy   Complete by:  As directed    Discharge instructions   Complete by:  As directed    You were cared for by a hospitalist during your hospital stay. If you have any questions about your discharge medications or the care you received while you were in the hospital after you are discharged, you can call the unit and ask to speak with the hospitalist on call if the hospitalist that took care of you is not available. Once you are discharged, your primary care physician will handle any further medical issues. Please note that NO REFILLS for any discharge medications will be authorized once you are discharged, as it is imperative that you return to your primary care physician (or establish a relationship with a primary care physician if you do not have one) for your aftercare needs so that they can reassess your need for medications and monitor your lab values.  Follow up with PCP, Pulmonary, Cardiology, and Medical Oncology. Take all medications as prescribed. If symptoms change or worsen please return to the ED for evaluation   Increase activity slowly   Complete by:  As directed      Allergies as of 03/01/2018      Reactions    Hydrocodone Itching   Percocet [oxycodone-acetaminophen] Itching   Vicodin [hydrocodone-acetaminophen] Itching   Tolerates with Benadryl. Tolerates acetaminophen alone.      Medication List    STOP taking these medications   guaiFENesin-dextromethorphan 100-10 MG/5ML syrup Commonly known as:  ROBITUSSIN DM   ibuprofen 800 MG tablet Commonly known as:  ADVIL,MOTRIN   MUCINEX DM 30-600 MG Tb12   pseudoephedrine-acetaminophen 30-500 MG Tabs tablet Commonly known as:  TYLENOL SINUS   traMADol 50 MG tablet Commonly known as:  ULTRAM     TAKE these medications   albuterol (2.5 MG/3ML) 0.083% nebulizer solution Commonly known as:  PROVENTIL Inhale 3 mLs into the lungs every 4 (four) hours as needed for shortness of breath.   atorvastatin 40 MG tablet Commonly known as:  LIPITOR Take 1 tablet (40 mg total) by mouth daily at 6 PM. What changed:  when to take this   fluticasone 50 MCG/ACT nasal spray Commonly known as:  FLONASE Place 2 sprays into both nostrils daily.   furosemide 40 MG tablet Commonly known as:  LASIX Take 1 tablet (40 mg total) by mouth daily. Start taking on:  03/02/2018 What changed:    medication strength  how much to take   guaiFENesin 600 MG 12 hr tablet Commonly known as:  MUCINEX Take 1 tablet (600 mg total) by mouth 2 (two) times daily for 3 days.   HYDROMET 5-1.5 MG/5ML syrup Generic drug:  HYDROcodone-homatropine Take 5 mLs by mouth every 6 (six) hours as needed for cough.   ipratropium-albuterol 0.5-2.5 (3) MG/3ML Soln Commonly known as:  DUONEB Take 3 mLs by nebulization every 6 (six) hours as needed.   multivitamin with minerals Tabs tablet Take 1 tablet by mouth daily.   potassium chloride SA 20 MEQ tablet Commonly known as:  K-DUR,KLOR-CON Take 1 tablet (20 mEq total) by mouth daily. Start taking on:  03/02/2018   ranitidine 150 MG tablet Commonly known as:  ZANTAC Take 150 mg by mouth 2 (two) times daily as needed for  heartburn.   sertraline 100 MG tablet Commonly known as:  ZOLOFT Take 100 mg by mouth at bedtime.   tiZANidine 4 MG tablet Commonly known as:  ZANAFLEX Take 4 mg by mouth at bedtime.      Follow-up Information    Icard, Leory Plowman L, DO Follow up on 03/11/2018.   Specialty:  Pulmonary Disease Why:  11am  Contact information: [place] [address] [phone]        Donald Prose, MD. Call.   Specialty:  Family Medicine Contact information: [address], [place] [facility] [address] [phone]        Minus Breeding, MD .   Specialty:  Cardiology Contact information: [address] [place] [address] [phone]          Allergies  Allergen Reactions  . Hydrocodone Itching  . Percocet [Oxycodone-Acetaminophen] Itching  . Vicodin [Hydrocodone-Acetaminophen] Itching    Tolerates with Benadryl. Tolerates acetaminophen alone.   Consultations:  Cardiology  Discussed Case with Pulmonary PA  Procedures/Studies: Dg Chest 2 View  Result Date: 02/26/2018 CLINICAL DATA:  Chest congestion, body ache and fever EXAM: CHEST - 2 VIEW COMPARISON:  02/06/2018 FINDINGS: Crowding of interstitial lung markings due to low lung volumes. Mild pulmonary vascular congestion and interstitial edema. No alveolar consolidation, effusion or pneumothorax. Stable mild cardiomegaly. No aortic aneurysm. No acute osseous abnormality. IMPRESSION: Stable mild cardiomegaly with mild interstitial edema. Electronically Signed   By: Ashley Royalty M.D.   On: 02/26/2018 21:08   Dg Chest 2 View  Result Date: 02/06/2018 CLINICAL DATA:  47 y/o  F; shortness of breath. EXAM: CHEST - 2 VIEW COMPARISON:  10/17/2016 chest radiograph.  10/18/2016 CTA chest. FINDINGS: Mild cardiomegaly. Hazy and reticular opacities of the lungs. No pleural effusion or pneumothorax. Mild multilevel degenerative changes of the spine. Right upper quadrant cholecystectomy clips. IMPRESSION: Mild  cardiomegaly. Hazy and reticular opacities of lungs probably represents pulmonary edema. Electronically Signed   By: Kristine Garbe M.D.   On: 02/06/2018 19:15   Ct Angio Chest Pe W And/or Wo Contrast  Result Date: 02/06/2018 CLINICAL DATA:  Lower extremity swelling.  Sedentary. EXAM: CT ANGIOGRAPHY CHEST WITH CONTRAST TECHNIQUE: Multidetector CT imaging of the chest was performed using the standard protocol during bolus administration of intravenous contrast. Multiplanar CT image reconstructions and MIPs were obtained to evaluate the vascular anatomy. CONTRAST:  151m ISOVUE-370 IOPAMIDOL (ISOVUE-370) INJECTION  76% COMPARISON:  Chest radiograph 02/06/2018 FINDINGS: Cardiovascular: Satisfactory opacification of the pulmonary arteries to the segmental level. No evidence of pulmonary embolism. Normal heart size. No pericardial effusion. Mediastinum/Nodes: Bilateral hilar and peribronchial lymph nodes. Shotty mediastinal lymph nodes. Normal appearance of the trachea, esophagus and thyroid gland. Lungs/Pleura: Upper lobe predominant centrilobular emphysema. Mild crowding of the interstitium. Small hazy right upper lobe subpleural airspace opacities, likely inflammatory. Upper Abdomen: Post cholecystectomy.  No acute findings. Musculoskeletal: No chest wall abnormality. No acute or significant osseous findings. Review of the MIP images confirms the above findings. IMPRESSION: No evidence of pulmonary embolus. Bilateral hilar and peribronchial lymphadenopathy with uncertain etiology. Associated shotty mediastinal lymph nodes. Upper lobe predominant centrilobular emphysema. Mild crowding of the interstitium which may be due to mild interstitial pulmonary edema or hypoinflation. Electronically Signed   By: Fidela Salisbury M.D.   On: 02/06/2018 20:08   Dg Chest Port 1 View  Result Date: 02/28/2018 CLINICAL DATA:  Shortness of breath EXAM: PORTABLE CHEST 1 VIEW COMPARISON:  February 26, 2018 FINDINGS:  There is a slight degree of interstitial edema, stable. There is no consolidation. There is cardiomegaly with mild pulmonary venous hypertension. No adenopathy. No bone lesions. IMPRESSION: Pulmonary vascular congestion with slight interstitial edema, stable compared to 2 days prior. No consolidation. No new opacity. Electronically Signed   By: Lowella Grip III M.D.   On: 02/28/2018 07:00   ECHOCARDIOGRAM ------------------------------------------------------------------- Study Conclusions  - Left ventricle: The cavity size was normal. Wall thickness was   normal. Systolic function was vigorous. The estimated ejection   fraction was in the range of 65% to 70%. Wall motion was normal;   there were no regional wall motion abnormalities. Doppler   parameters are consistent with abnormal left ventricular   relaxation (grade 1 diastolic dysfunction). - Aortic valve: There was mild stenosis. - Pulmonic valve: Peak gradient (S): 15 mm Hg.  Impressions:  - Vigorous LV systolic function; mild diastolic dysfunction;   elevated mean gradient (13 mmHg) suggests mild AS but may be   related to hyperdynamic LV function.  Subjective: Seen and examined at bedside and patient was doing better.  Denying chest pain, shortness breath, nausea, vomiting.  Ambulating without using oxygen and doing well.  States that she feels better and is no longer short of breath.  No other concerns or complaints at this time and ready to go home.  Discharge Exam: Vitals:   03/01/18 0836 03/01/18 1325  BP:  133/68  Pulse:  63  Resp:  18  Temp:  98 F (36.7 C)  SpO2: 96% 94%   Vitals:   02/28/18 2046 03/01/18 0544 03/01/18 0836 03/01/18 1325  BP: 134/72 132/77  133/68  Pulse: 72 (!) 56  63  Resp: _0 Temp: 98.4 F (36.9 C) 98.7 F (37.1 C)  98 F (36.7 C)  TempSrc: Oral Oral  Oral  SpO2: 91% 96% 96% 94%  Weight:  125.8 kg    Height:       General: Pt is an obese AAF who isalert, awake, not in  acute distress Cardiovascular: RRR, S1/S2 +, no rubs, no gallops Respiratory: Diminished bilaterally, no wheezing, no rhonchi Abdominal: Soft, NT, Distended due to body habitus, bowel sounds + Extremities: Trace edema, no cyanosis  The results of significant diagnostics from this hospitalization (including imaging, microbiology, ancillary and laboratory) are listed below for reference.    Microbiology: No results found for this or any previous visit (from the past  240 hour(s)).   Labs: BNP (last 3 results) Recent Labs    02/26/18 2136  BNP 65.7   Basic Metabolic Panel: Recent Labs  Lab 02/26/18 2136 02/27/18 0204 02/27/18 0925 02/28/18 0346 03/01/18 0443  NA 141  --  142 140 142  K 3.4*  --  3.8 3.7 3.8  CL 108  --  109 108 107  CO2 22  --  21* 24 26  GLUCOSE 124*  --  257* 149* 119*  BUN 7  --  _0 CREATININE 1.02*  --  1.09* 0.88 0.92  CALCIUM 8.9  --  9.2 9.0 9.1  MG  --  1.7  --  2.1 1.9  PHOS  --   --   --  2.3* 4.0   Liver Function Tests: Recent Labs  Lab 02/27/18 0925 02/28/18 0346 03/01/18 0443  AST 42* 31 23  ALT _1 ALKPHOS 60 55 53  BILITOT 0.7 0.5 0.5  PROT 7.7 7.4 6.9  ALBUMIN 3.7 3.5 3.3*   No results for input(s): LIPASE, AMYLASE in the last 168 hours. No results for input(s): AMMONIA in the last 168 hours. CBC: Recent Labs  Lab 02/26/18 2136 02/27/18 0647 02/28/18 0346 03/01/18 0443  WBC 18.4* 11.0* 15.2* 16.0*  NEUTROABS  --  10.3* 12.5* 10.8*  HGB 13.9 13.8 13.3 13.7  HCT 42.8 42.4 41.2 42.7  MCV 84.6 84.1 84.1 85.1  PLT 367 393 391 388   Cardiac Enzymes: Recent Labs  Lab 02/27/18 0647 02/27/18 0925 02/27/18 1540  TROPONINI 0.05* 0.05* 0.05*   BNP: Invalid input(s): POCBNP CBG: No results for input(s): GLUCAP in the last 168 hours. D-Dimer No results for input(s): DDIMER in the last 72 hours. Hgb A1c No results for input(s): HGBA1C in the last 72 hours. Lipid Profile No results for input(s): CHOL, HDL,  LDLCALC, TRIG, CHOLHDL, LDLDIRECT in the last 72 hours. Thyroid function studies Recent Labs    02/27/18 0355  TSH 0.898   Anemia work up No results for input(s): VITAMINB12, FOLATE, FERRITIN, TIBC, IRON, RETICCTPCT in the last 72 hours. Urinalysis    Component Value Date/Time   COLORURINE YELLOW 04/06/2017 0946   APPEARANCEUR CLEAR 04/06/2017 0946   LABSPEC 1.010 04/06/2017 0946   PHURINE 6.0 04/06/2017 0946   GLUCOSEU NEGATIVE 04/06/2017 0946   HGBUR NEGATIVE 04/06/2017 0946   BILIRUBINUR NEGATIVE 04/06/2017 0946   KETONESUR NEGATIVE 04/06/2017 0946   PROTEINUR NEGATIVE 04/06/2017 0946   UROBILINOGEN 0.2 12/03/2014 1850   NITRITE NEGATIVE 04/06/2017 0946   LEUKOCYTESUR NEGATIVE 04/06/2017 0946   Sepsis Labs Invalid input(s): PROCALCITONIN,  WBC,  LACTICIDVEN Microbiology No results found for this or any previous visit (from the past 240 hour(s)).  Time coordinating discharge: 35 minutes  SIGNED:  Kerney Elbe, DO [facility] 03/01/2018, 2:28 PM Pager is on [place]  If 7PM-7AM, please contact night-coverage [email or web address] Password [email or web address]

## 2018-03-04 ENCOUNTER — Encounter: Payer: Self-pay | Admitting: Hematology and Oncology

## 2018-03-04 ENCOUNTER — Telehealth: Payer: Self-pay | Admitting: Hematology and Oncology

## 2018-03-04 NOTE — Telephone Encounter (Signed)
New referral received from Dr. Marland Mcalpine at the ED for leukocytosis. Pt has been cld and scheduled to see Dr. Caron Presume on 10/11 at 1pm. Pt aware to arrive 30 minutes early. Letter mailed.

## 2018-03-05 ENCOUNTER — Emergency Department (HOSPITAL_COMMUNITY): Payer: Managed Care, Other (non HMO)

## 2018-03-05 ENCOUNTER — Emergency Department (HOSPITAL_COMMUNITY)
Admission: EM | Admit: 2018-03-05 | Discharge: 2018-03-05 | Disposition: A | Discharge status: Home / Self Care | Payer: Managed Care, Other (non HMO) | Attending: Emergency Medicine | Admitting: Emergency Medicine

## 2018-03-05 ENCOUNTER — Encounter (HOSPITAL_COMMUNITY): Payer: Self-pay | Admitting: Emergency Medicine

## 2018-03-05 ENCOUNTER — Other Ambulatory Visit: Payer: Self-pay

## 2018-03-05 DIAGNOSIS — Z79899 Other long term (current) drug therapy: Secondary | ICD-10-CM | POA: Insufficient documentation

## 2018-03-05 DIAGNOSIS — F17210 Nicotine dependence, cigarettes, uncomplicated: Secondary | ICD-10-CM | POA: Diagnosis not present

## 2018-03-05 DIAGNOSIS — R0789 Other chest pain: Secondary | ICD-10-CM | POA: Diagnosis not present

## 2018-03-05 DIAGNOSIS — I509 Heart failure, unspecified: Secondary | ICD-10-CM | POA: Diagnosis not present

## 2018-03-05 DIAGNOSIS — J209 Acute bronchitis, unspecified: Secondary | ICD-10-CM | POA: Insufficient documentation

## 2018-03-05 DIAGNOSIS — R079 Chest pain, unspecified: Secondary | ICD-10-CM | POA: Diagnosis present

## 2018-03-05 DIAGNOSIS — J40 Bronchitis, not specified as acute or chronic: Secondary | ICD-10-CM

## 2018-03-05 HISTORY — DX: Heart failure, unspecified: I50.9

## 2018-03-05 LAB — CBC
HCT: 49.3 % — ABNORMAL HIGH (ref 36.0–46.0)
Hemoglobin: 15.3 g/dL — ABNORMAL HIGH (ref 12.0–15.0)
MCH: 26.7 pg (ref 26.0–34.0)
MCHC: 31 g/dL (ref 30.0–36.0)
MCV: 85.9 fL (ref 78.0–100.0)
PLATELETS: 363 10*3/uL (ref 150–400)
RBC: 5.74 MIL/uL — ABNORMAL HIGH (ref 3.87–5.11)
RDW: 14.2 % (ref 11.5–15.5)
WBC: 14.8 10*3/uL — AB (ref 4.0–10.5)

## 2018-03-05 LAB — BASIC METABOLIC PANEL
Anion gap: 10 (ref 5–15)
BUN: 9 mg/dL (ref 6–20)
CO2: 22 mmol/L (ref 22–32)
CREATININE: 1.23 mg/dL — AB (ref 0.44–1.00)
Calcium: 8.9 mg/dL (ref 8.9–10.3)
Chloride: 104 mmol/L (ref 98–111)
GFR calc Af Amer: 59 mL/min — ABNORMAL LOW (ref >60)
GFR, EST NON AFRICAN AMERICAN: 51 mL/min — AB (ref >60)
GLUCOSE: 126 mg/dL — AB (ref 70–99)
Potassium: 3.7 mmol/L (ref 3.5–5.1)
SODIUM: 136 mmol/L (ref 135–145)

## 2018-03-05 LAB — I-STAT TROPONIN, ED: Troponin i, poc: 0 ng/mL (ref 0.00–0.08)

## 2018-03-05 LAB — BRAIN NATRIURETIC PEPTIDE: B NATRIURETIC PEPTIDE 5: 7.7 pg/mL (ref 0.0–100.0)

## 2018-03-05 MED ORDER — ACETAMINOPHEN 325 MG PO TABS
650.0000 mg | ORAL_TABLET | Freq: Once | ORAL | Status: AC
  Administered 2018-03-05: 650 mg via ORAL
  Filled 2018-03-05: qty 2

## 2018-03-05 NOTE — Discharge Instructions (Addendum)
Take Tylenol as directed for pain.  Use your inhalers or nebulizer as directed for cough or shortness of breath your scheduled appointment at the cardiology office on 03/23/2018

## 2018-03-05 NOTE — ED Provider Notes (Signed)
[facility] EMERGENCY DEPARTMENT Provider Note   CSN: [id number] Arrival date & time: 03/05/18  1819     History   Chief Complaint Chief Complaint  Patient presents with  . Chest Pain    HPI Kelsey Morgan is a 47 y.o. female.  HPI planes of anterior chest pain for approximate the past week which is exacerbated by coughing.  She has a nonproductive cough with mild shortness of breath.  She is been treating herself with her albuterol nebulizer with relief of breathing.  She denies any fever.  She also reports intermittent chest pain lasting approximately minute at times sometimes at rest, nonradiating anterior for approximate the past week pain is nonexertional.  She denies fever denies other associated symptoms she feels much improved over March 01, 2018 when she was discharged from the hospital as inpatient respiratory failure.  Past Medical History:  Diagnosis Date  . Arthritis   . Back pain   . Bronchitis   . CHF (congestive heart failure) (HCC)   . Gout   . Pneumonia     Patient Active Problem List   Diagnosis Date Noted  . Acute exacerbation of CHF (congestive heart failure) (HCC) 02/27/2018  . Prolonged QT interval 02/27/2018  . Bronchitis 02/27/2018  . Lymphadenopathy, hilar 02/27/2018  . Hypokalemia 02/27/2018  . Chest pain 10/18/2016  . Leucocytosis 10/18/2016    Past Surgical History:  Procedure Laterality Date  . ABDOMINAL HYSTERECTOMY    . ANKLE SURGERY    . CHOLECYSTECTOMY    . EXCISION MASS NECK N/A 11/12/2017   Procedure: EXCISION POSTERIOR  NECK CYST;  Surgeon: Axel Filler, MD;  Location: [facility] OR;  Service: General;  Laterality: N/A;  . FOOT SURGERY    . FRACTURE SURGERY    . KNEE SURGERY       OB History   None      Home Medications    Prior to Admission medications   Medication Sig Start Date End Date Taking? Authorizing Provider  albuterol (PROVENTIL) (2.5 MG/3ML) 0.083% nebulizer solution Take 3 mLs by  nebulization every 4 (four) hours as needed for wheezing or shortness of breath.  02/22/18  Yes [provider]  atorvastatin (LIPITOR) 40 MG tablet Take 1 tablet (40 mg total) by mouth daily at 6 PM. Patient taking differently: Take 40 mg by mouth at bedtime.  10/19/16  Yes Philip Aspen, Limmie Patricia, MD  benzonatate (TESSALON) 200 MG capsule Take 200 mg by mouth 3 (three) times daily as needed for cough. 03/02/18  Yes [provider]  fluticasone (FLONASE) 50 MCG/ACT nasal spray Place 2 sprays into both nostrils daily.   Yes [provider]  furosemide (LASIX) 40 MG tablet Take 1 tablet (40 mg total) by mouth daily. 03/02/18  Yes Sheikh, Omair Latif, DO  guaiFENesin (MUCINEX) 600 MG 12 hr tablet Take 600 mg by mouth 2 (two) times daily.   Yes [provider]  Multiple Vitamin (MULTIVITAMIN WITH MINERALS) TABS tablet Take 1 tablet by mouth daily.   Yes [provider]  potassium chloride SA (K-DUR,KLOR-CON) 20 MEQ tablet Take 1 tablet (20 mEq total) by mouth daily. 03/02/18  Yes Sheikh, Omair Latif, DO  ranitidine (ZANTAC) 150 MG tablet Take 150 mg by mouth 2 (two) times daily as needed for heartburn.    Yes [provider]  sertraline (ZOLOFT) 100 MG tablet Take 100 mg by mouth at bedtime.    Yes [provider]  HYDROMET 5-1.5 MG/5ML syrup  Take 5 mLs by mouth every 6 (six) hours as needed for cough.  02/22/18   [provider]  ipratropium-albuterol (DUONEB) 0.5-2.5 (3) MG/3ML SOLN Take 3 mLs by nebulization every 6 (six) hours as needed. Patient not taking: Reported on 03/05/2018 03/01/18   Merlene Laughter, DO    Family History Family History  Problem Relation Age of Onset  . Hypertension Mother   . Kidney failure Mother   . Heart disease Mother        Onset in her 47s    Social History Social History   Tobacco Use  . Smoking status: Current Every Day Smoker    Packs/day: 0.25    Years: 25.00    Pack years: 6.25      Types: Cigarettes  . Smokeless tobacco: Never Used  Substance Use Topics  . Alcohol use: No  . Drug use: No   Ex-smoker quit 1.5 months ago  Allergies   Hydrocodone; Percocet [oxycodone-acetaminophen]; and Vicodin [hydrocodone-acetaminophen]   Review of Systems Review of Systems  Constitutional: Negative.   HENT: Negative.   Respiratory: Positive for cough and shortness of breath.   Cardiovascular: Positive for chest pain.  Gastrointestinal: Negative.   Musculoskeletal: Negative.   Skin: Negative.   Neurological: Negative.   Psychiatric/Behavioral: Negative.   All other systems reviewed and are negative.    Physical Exam Updated Vital Signs BP (!) 137/97 (BP Location: Right Arm)   Pulse 92   Temp 98.9 F (37.2 C) (Oral)   Resp 16   SpO2 96%   Physical Exam  Constitutional: She appears well-developed and well-nourished. No distress.  HENT:  Head: Normocephalic and atraumatic.  Eyes: Pupils are equal, round, and reactive to light. Conjunctivae are normal.  Neck: Neck supple. No tracheal deviation present. No thyromegaly present.  Cardiovascular: Normal rate, regular rhythm and normal heart sounds.  No murmur heard. Pulmonary/Chest: Effort normal and breath sounds normal.  Abdominal: Soft. Bowel sounds are normal. She exhibits no distension. There is no tenderness.  obese  Musculoskeletal: Normal range of motion. She exhibits no edema or tenderness.  Neurological: She is alert. Coordination normal.  Skin: Skin is warm and dry. No rash noted.  Psychiatric: She has a normal mood and affect.  Nursing note and vitals reviewed.    ED Treatments / Results  Labs (all labs ordered are listed, but only abnormal results are displayed) Labs Reviewed  BASIC METABOLIC PANEL - Abnormal; Notable for the following components:      Result Value   Glucose, Bld 126 (*)    Creatinine, Ser 1.23 (*)    GFR calc non Af Amer 51 (*)    GFR calc Af Amer 59 (*)    All other  components within normal limits  CBC - Abnormal; Notable for the following components:   WBC 14.8 (*)    RBC 5.74 (*)    Hemoglobin 15.3 (*)    HCT 49.3 (*)    All other components within normal limits  BRAIN NATRIURETIC PEPTIDE  I-STAT TROPONIN, ED    EKG EKG Interpretation  Date/Time:  Thursday March 05 2018 18:22:54 EDT Ventricular Rate:  90 PR Interval:  138 QRS Duration: 72 QT Interval:  390 QTC Calculation: 477 R Axis:   -18 Text Interpretation:  Sinus rhythm with Premature atrial complexes in a pattern of bigeminy Septal infarct , age undetermined Abnormal ECG No significant change since last tracing Confirmed by Doug Sou [phone]) on 03/05/2018 8:36:04 PM   Radiology Dg  Chest 2 View  Result Date: 03/05/2018 CLINICAL DATA:  Chest pain, shortness of breath EXAM: CHEST - 2 VIEW COMPARISON:  03/01/2017 FINDINGS: Lungs are essentially clear. Mild bibasilar atelectasis. No focal consolidation. No pleural effusion or pneumothorax. The heart is normal in size. Visualized osseous structures are within normal limits. IMPRESSION: No evidence of acute cardiopulmonary disease. Electronically Signed   By: Charline Bills M.D.   On: 03/05/2018 19:24   Chest x-ray viewed by me Procedures Procedures (including critical care time)  Medications Ordered in ED Medications  acetaminophen (TYLENOL) tablet 650 mg (has no administration in time range)    Results for orders placed or performed during the hospital encounter of 03/05/18  Basic metabolic panel  Result Value Ref Range   Sodium 136 135 - 145 mmol/L   Potassium 3.7 3.5 - 5.1 mmol/L   Chloride 104 98 - 111 mmol/L   CO2 22 22 - 32 mmol/L   Glucose, Bld 126 (H) 70 - 99 mg/dL   BUN 9 6 - 20 mg/dL   Creatinine, Ser 1.61 (H) 0.44 - 1.00 mg/dL   Calcium 8.9 8.9 - 09.6 mg/dL   GFR calc non Af Amer 51 (L) >60 mL/min   GFR calc Af Amer 59 (L) >60 mL/min   Anion gap 10 5 - 15  CBC  Result Value Ref Range   WBC 14.8 (H) 4.0  - 10.5 K/uL   RBC 5.74 (H) 3.87 - 5.11 MIL/uL   Hemoglobin 15.3 (H) 12.0 - 15.0 g/dL   HCT 04.5 (H) 40.9 - 81.1 %   MCV 85.9 78.0 - 100.0 fL   MCH 26.7 26.0 - 34.0 pg   MCHC 31.0 30.0 - 36.0 g/dL   RDW 91.4 78.2 - 95.6 %   Platelets 363 150 - 400 K/uL  Brain natriuretic peptide  Result Value Ref Range   B Natriuretic Peptide 7.7 0.0 - 100.0 pg/mL  I-stat troponin, ED  Result Value Ref Range   Troponin i, poc 0.00 0.00 - 0.08 ng/mL   Comment 3           Dg Chest 2 View  Result Date: 03/05/2018 CLINICAL DATA:  Chest pain, shortness of breath EXAM: CHEST - 2 VIEW COMPARISON:  03/01/2017 FINDINGS: Lungs are essentially clear. Mild bibasilar atelectasis. No focal consolidation. No pleural effusion or pneumothorax. The heart is normal in size. Visualized osseous structures are within normal limits. IMPRESSION: No evidence of acute cardiopulmonary disease. Electronically Signed   By: Charline Bills M.D.   On: 03/05/2018 19:24   Dg Chest 2 View  Result Date: 03/01/2018 CLINICAL DATA:  Cough and chest pressure EXAM: CHEST - 2 VIEW COMPARISON:  February 28, 2018 chest radiograph and chest CT February 06, 2018 FINDINGS: Interstitium is mildly thickened in a generalized manner. There is no edema or consolidation. Heart is borderline prominent with pulmonary vascularity normal. No adenopathy. No bone lesions. IMPRESSION: Interstitial thickening without frank edema or consolidation. Heart borderline prominent, stable. No adenopathy evident. Electronically Signed   By: Bretta Bang III M.D.   On: 03/01/2018 14:35   Dg Chest 2 View  Result Date: 02/26/2018 CLINICAL DATA:  Chest congestion, body ache and fever EXAM: CHEST - 2 VIEW COMPARISON:  02/06/2018 FINDINGS: Crowding of interstitial lung markings due to low lung volumes. Mild pulmonary vascular congestion and interstitial edema. No alveolar consolidation, effusion or pneumothorax. Stable mild cardiomegaly. No aortic aneurysm. No acute  osseous abnormality. IMPRESSION: Stable mild cardiomegaly with mild interstitial edema. Electronically Signed  By: Tollie Eth M.D.   On: 02/26/2018 21:08   Dg Chest 2 View  Result Date: 02/06/2018 CLINICAL DATA:  47 y/o  F; shortness of breath. EXAM: CHEST - 2 VIEW COMPARISON:  10/17/2016 chest radiograph.  10/18/2016 CTA chest. FINDINGS: Mild cardiomegaly. Hazy and reticular opacities of the lungs. No pleural effusion or pneumothorax. Mild multilevel degenerative changes of the spine. Right upper quadrant cholecystectomy clips. IMPRESSION: Mild cardiomegaly. Hazy and reticular opacities of lungs probably represents pulmonary edema. Electronically Signed   By: Mitzi Hansen M.D.   On: 02/06/2018 19:15   Ct Angio Chest Pe W And/or Wo Contrast  Result Date: 02/06/2018 CLINICAL DATA:  Lower extremity swelling.  Sedentary. EXAM: CT ANGIOGRAPHY CHEST WITH CONTRAST TECHNIQUE: Multidetector CT imaging of the chest was performed using the standard protocol during bolus administration of intravenous contrast. Multiplanar CT image reconstructions and MIPs were obtained to evaluate the vascular anatomy. CONTRAST:  ISOVUE-370 IOPAMIDOL (ISOVUE-370) INJECTION 76% COMPARISON:  Chest radiograph 02/06/2018 FINDINGS: Cardiovascular: Satisfactory opacification of the pulmonary arteries to the segmental level. No evidence of pulmonary embolism. Normal heart size. No pericardial effusion. Mediastinum/Nodes: Bilateral hilar and peribronchial lymph nodes. Shotty mediastinal lymph nodes. Normal appearance of the trachea, esophagus and thyroid gland. Lungs/Pleura: Upper lobe predominant centrilobular emphysema. Mild crowding of the interstitium. Small hazy right upper lobe subpleural airspace opacities, likely inflammatory. Upper Abdomen: Post cholecystectomy.  No acute findings. Musculoskeletal: No chest wall abnormality. No acute or significant osseous findings. Review of the MIP images confirms the above  findings. IMPRESSION: No evidence of pulmonary embolus. Bilateral hilar and peribronchial lymphadenopathy with uncertain etiology. Associated shotty mediastinal lymph nodes. Upper lobe predominant centrilobular emphysema. Mild crowding of the interstitium which may be due to mild interstitial pulmonary edema or hypoinflation. Electronically Signed   By: Ted Mcalpine M.D.   On: 02/06/2018 20:08   Dg Chest Port 1 View  Result Date: 02/28/2018 CLINICAL DATA:  Shortness of breath EXAM: PORTABLE CHEST 1 VIEW COMPARISON:  February 26, 2018 FINDINGS: There is a slight degree of interstitial edema, stable. There is no consolidation. There is cardiomegaly with mild pulmonary venous hypertension. No adenopathy. No bone lesions. IMPRESSION: Pulmonary vascular congestion with slight interstitial edema, stable compared to 2 days prior. No consolidation. No new opacity. Electronically Signed   By: Bretta Bang III M.D.   On: 02/28/2018 07:00   Initial Impression / Assessment and Plan / ED Course  I have reviewed the triage vital signs and the nursing notes.  Pertinent labs & imaging results that were available during my care of the patient were reviewed by me and considered in my medical decision making (see chart for details).     At 11 PM patient is resting comfortably treatment with Tylenol.  She speaks in paragraphs she is in no respiratory distress.  Comes consistent with acute bronchitis Heart score equals 3.  Plan keep scheduled appointment with cardiologist 03/23/2018.  Work remarkable for leukocytosis which is improving over previous and mild renal insufficiency Final Clinical Impressions(s) / ED Diagnoses  #1 atypical chest pain #2 acute bronchitis Final diagnoses:  None    ED Discharge Orders    None       Doug Sou, MD 03/05/18 2313

## 2018-03-05 NOTE — ED Triage Notes (Addendum)
Patient to ED c/o worsening central, non-radiating CP and shortness of breath today since being discharged from [facility] for bronchitis/pneumonia/CHF. Denies fevers/chills or cough. Resp e/u, skin warm/dry.

## 2018-03-05 NOTE — ED Provider Notes (Signed)
Patient placed in Quick Look pathway, seen and evaluated   Chief Complaint: chest pain  HPI: Kelsey Morgan is a 47 y.o. female who presents to the ED with chest pain. The pain is worsening central, non-radiating CP and shortness of breath today since being discharged from [facility] for bronchitis/pneumonia and acute respiratory failure and CHF. Denies fevers/chills.   ROS: CV: chest pain  Physical Exam:  BP (!) 140/97 (BP Location: Right Arm)   Pulse 84   Temp 98.9 F (37.2 C) (Oral)   Resp 16   SpO2 96%    Gen: No distress  Neuro: Awake and Alert  Skin: Warm and dry  Lungs: decreased breath sounds with rales on the right lower heard  Initiation of care has begun. The patient has been counseled on the process, plan, and necessity for staying for the completion/evaluation, and the remainder of the medical screening examination    Janne Napoleon, NP 03/05/18 Clyda Hurdle, MD 03/05/18 2244

## 2018-03-09 ENCOUNTER — Encounter: Payer: Managed Care, Other (non HMO) | Admitting: Physical Medicine & Rehabilitation

## 2018-03-10 ENCOUNTER — Encounter: Payer: Self-pay | Admitting: Physical Medicine & Rehabilitation

## 2018-03-10 ENCOUNTER — Encounter
Payer: Managed Care, Other (non HMO) | Attending: Physical Medicine & Rehabilitation | Admitting: Physical Medicine & Rehabilitation

## 2018-03-10 ENCOUNTER — Ambulatory Visit
Admission: RE | Admit: 2018-03-10 | Discharge: 2018-03-10 | Disposition: A | Discharge status: Home / Self Care | Payer: Managed Care, Other (non HMO) | Source: Ambulatory Visit | Attending: Physical Medicine & Rehabilitation | Admitting: Physical Medicine & Rehabilitation

## 2018-03-10 DIAGNOSIS — Z9049 Acquired absence of other specified parts of digestive tract: Secondary | ICD-10-CM | POA: Diagnosis not present

## 2018-03-10 DIAGNOSIS — F17210 Nicotine dependence, cigarettes, uncomplicated: Secondary | ICD-10-CM | POA: Diagnosis not present

## 2018-03-10 DIAGNOSIS — M1712 Unilateral primary osteoarthritis, left knee: Secondary | ICD-10-CM

## 2018-03-10 DIAGNOSIS — M25562 Pain in left knee: Secondary | ICD-10-CM | POA: Diagnosis present

## 2018-03-10 DIAGNOSIS — G8929 Other chronic pain: Secondary | ICD-10-CM | POA: Insufficient documentation

## 2018-03-10 DIAGNOSIS — I509 Heart failure, unspecified: Secondary | ICD-10-CM | POA: Insufficient documentation

## 2018-03-10 DIAGNOSIS — Z90710 Acquired absence of both cervix and uterus: Secondary | ICD-10-CM | POA: Insufficient documentation

## 2018-03-10 DIAGNOSIS — Z9889 Other specified postprocedural states: Secondary | ICD-10-CM | POA: Diagnosis not present

## 2018-03-10 MED ORDER — DICLOFENAC SODIUM 1 % TD GEL: 1.0000 "application " | Freq: Three times a day (TID) | TRANSDERMAL | 4 refills | Status: DC

## 2018-03-10 NOTE — Patient Instructions (Addendum)
Thank you for visiting Dr. Tonia Brooms at [facility]. Today we recommend the following: Orders Placed This Encounter  Procedures  . CT Chest Wo Contrast  . CBC w/Diff  . Resp Allergy Profile Regn2DC DE MD [facility]  . Magnesium  . Potassium  . Ambulatory Referral for DME  . Pulmonary function test   Meds ordered this encounter  Medications  . ranitidine (ZANTAC) 150 MG tablet    Sig: Take 1 tablet (150 mg total) by mouth at bedtime.    Dispense:  30 tablet    Refill:  5  . omeprazole (PRILOSEC) 20 MG capsule    Sig: Take 1 capsule (20 mg total) by mouth daily.    Dispense:  30 capsule    Refill:  5  . fluticasone furoate-vilanterol (BREO ELLIPTA) 200-25 MCG/INH AEPB    Sig: Inhale 1 puff into the lungs daily.    Dispense:  28 each    Refill:  5  . fexofenadine (ALLEGRA) 180 MG tablet    Sig: Take 1 tablet (180 mg total) by mouth daily.    Dispense:  30 tablet    Refill:  5   We will start Breo inhaler, once daily Would start over-the-counter Allegra for allergy symptoms.  Return in about 5 weeks (around 04/15/2018), or if symptoms worsen or fail to improve.

## 2018-03-10 NOTE — Patient Instructions (Signed)
PLEASE FEEL FREE TO CALL OUR OFFICE WITH ANY PROBLEMS OR QUESTIONS [phone])     Use ICE over heat for left knee pain
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
## 2018-03-10 NOTE — Progress Notes (Signed)
Subjective:    Patient ID: Kelsey Morgan, female    DOB: June 02, 1971, 47 y.o.   MRN: [id number]  HPI   Mrs. Smestad is here in regard to left knee pain which has been present for about 2 months. She first noticed it when she got out of her truck (she's a long dx truck driver) when she put her foot on the ground while her knee was twisted. She has been having to use a cane or off load the leg to deal with the pain.  She has used a heating pad, ice, topicals, lidocaine patches, voltaren gel helped a little. She has also used tizanidine, tylenol, and occasionally ibuprofen (which bothers her stomach)  She had left knee surgery in 1995 which apparently was arthroscopic. She did fairly well until about 2017 when she saw orthopedic surgery and received a shot. She did well again until recently.   She has been in and out of the hospital with pulmonary and cardiac issues over the last few weeks.  Her mobility has further decreased because of those issues..     Pain Inventory Average Pain 10 Pain Right Now 10 My pain is sharp, stabbing and aching  In the last 24 hours, has pain interfered with the following? General activity 8 Relation with others 8 Enjoyment of life 8 What TIME of day is your pain at its worst? 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 Hypertension Mother   . Kidney failure Mother   . Heart disease Mother        Onset in her 34s   Social History   Socioeconomic History  . Marital status: Single    Spouse name: Not on file  . Number of children: Not on file  . Years of education: Not on file  .  Highest education level: Not on file  Occupational History  . Occupation: Truck Runner, broadcasting/film/video  . Financial resource strain: Not on file  . Food insecurity:    Worry: Not on file    Inability: Not on file  . Transportation needs:    Medical: Not on file    Non-medical: Not on file  Tobacco Use  . Smoking status: Current Every Day Smoker    Packs/day: 0.25    Years: 25.00    Pack years: 6.25    Types: Cigarettes  . Smokeless tobacco: Never Used  Substance and Sexual Activity  . Alcohol use: No  . Drug use: No  . Sexual activity: Not Currently  Lifestyle  . Physical activity:    Days per week: Not on file    Minutes per session: Not on file  . Stress: Not on file  Relationships  . Social connections:    Talks on phone: Not on file    Gets together: Not on file    Attends religious service: Not on file    Active member of club or organization: Not on file    Attends meetings of clubs or organizations: Not on file    Relationship status: Not on file  Other Topics Concern  . Not on file  Social  History Narrative   Lives with partner.     Past Surgical History:  Procedure Laterality Date  . ABDOMINAL HYSTERECTOMY    . ANKLE SURGERY    . CHOLECYSTECTOMY    . EXCISION MASS NECK N/A 11/12/2017   Procedure: EXCISION POSTERIOR  NECK CYST;  Surgeon: Axel Filler, MD;  Location: [facility] OR;  Service: General;  Laterality: N/A;  . FOOT SURGERY    . FRACTURE SURGERY    . KNEE SURGERY     Past Medical History:  Diagnosis Date  . Arthritis   . Back pain   . Bronchitis   . CHF (congestive heart failure) (HCC)   . Gout   . Pneumonia    BP 124/81   Pulse 80   Resp 14   Ht 5' 6.5" (1.689 m)   Wt 284 lb (128.8 kg)   SpO2 91%   BMI 45.15 kg/m   Opioid Risk Score:   Fall Risk Score:  `1  Depression screen PHQ 2/9  Depression screen PHQ 2/9 03/10/2018  Decreased Interest 0  Down, Depressed, Hopeless 0  PHQ - 2 Score 0  Altered sleeping 3  Tired, decreased  energy 3  Change in appetite 1  Feeling bad or failure about yourself  0  Trouble concentrating 0  Moving slowly or fidgety/restless 0  Suicidal thoughts 0  PHQ-9 Score 7  Difficult doing work/chores Not difficult at all    Review of Systems  Constitutional: Negative.   HENT: Negative.   Eyes: Negative.   Respiratory: Positive for cough, shortness of breath and wheezing.   Cardiovascular: Positive for leg swelling.  Gastrointestinal: Positive for nausea.  Endocrine: Negative.   Genitourinary: Negative.   Musculoskeletal: Positive for arthralgias and gait problem.  Skin: Negative.   Allergic/Immunologic: Negative.   Hematological: Negative.   Psychiatric/Behavioral: Negative.   All other systems reviewed and are negative.      Objective:   Physical Exam  General: Alert and oriented x 3, No apparent distress.  Obese HEENT: Head is normocephalic, atraumatic, PERRLA, EOMI, sclera anicteric, oral mucosa pink and moist, dentition intact, ext ear canals clear,  Neck: Supple without JVD or lymphadenopathy Heart: Reg rate and rhythm. No murmurs rubs or gallops Chest: CTA bilaterally without wheezes, rales, or rhonchi; no distress Abdomen: Soft, non-tender, non-distended, bowel sounds positive. Extremities: No clubbing, cyanosis, or edema. Pulses are 2+ Skin: Clean and intact without signs of breakdown Neuro: Pt is cognitively appropriate with normal insight, memory, and awareness. Cranial nerves 2-12 are intact. Sensory exam is normal. Reflexes are 2+ in all 4's. Fine motor coordination is intact. No tremors. Motor function is grossly 5/5.  Musculoskeletal: Patient with antalgia upon weightbearing on the left leg.  Medial and lateral joint line tenderness with palpation.  Patella with minimal tenderness.  Minimal crepitus with extension and flexion.  McMurray's test positive medially and laterally today.  No ligamentous instability seen.  Mild varus deformity at the knee.  Psych:  Pt's affect is appropriate. Pt is cooperative         Assessment & Plan:  1. Acute on chronic left knee pain. Likely degenerative joint disease with meniscus injury  Plan: 1. xrays of left knee to assess joint space and alignment 2. After informed consent and preparation of the skin with betadine and isopropyl alcohol, I injected 6mg  (1cc) of celestone and 4cc of 1% lidocaine into the left knee via lateral approach. Additionally, aspiration was performed prior to injection. The patient tolerated well, and no  complications were encountered. Afterward the area was cleaned and dressed. Post- injection instructions were provided.  3. No Oral NSAID's given cardiac issues. Will try voltaren gel in its place    Follow up in a month. Thirty minutes of face to face patient care time were spent during this visit. All questions were encouraged and answered.

## 2018-03-11 ENCOUNTER — Ambulatory Visit: Payer: Managed Care, Other (non HMO) | Admitting: Pulmonary Disease

## 2018-03-11 ENCOUNTER — Telehealth: Payer: Self-pay | Admitting: Pulmonary Disease

## 2018-03-11 ENCOUNTER — Other Ambulatory Visit (INDEPENDENT_AMBULATORY_CARE_PROVIDER_SITE_OTHER): Payer: Managed Care, Other (non HMO)

## 2018-03-11 ENCOUNTER — Encounter: Payer: Self-pay | Admitting: Pulmonary Disease

## 2018-03-11 VITALS — BP 138/90 | HR 93 | Ht 66.0 in | Wt 286.6 lb

## 2018-03-11 DIAGNOSIS — R590 Localized enlarged lymph nodes: Secondary | ICD-10-CM | POA: Diagnosis not present

## 2018-03-11 DIAGNOSIS — Z6841 Body Mass Index (BMI) 40.0 and over, adult: Secondary | ICD-10-CM

## 2018-03-11 DIAGNOSIS — I5032 Chronic diastolic (congestive) heart failure: Secondary | ICD-10-CM | POA: Diagnosis not present

## 2018-03-11 DIAGNOSIS — R05 Cough: Secondary | ICD-10-CM

## 2018-03-11 DIAGNOSIS — R058 Other specified cough: Secondary | ICD-10-CM

## 2018-03-11 DIAGNOSIS — R059 Cough, unspecified: Secondary | ICD-10-CM

## 2018-03-11 DIAGNOSIS — R0602 Shortness of breath: Secondary | ICD-10-CM

## 2018-03-11 LAB — CBC WITH DIFFERENTIAL/PLATELET
Basophils Absolute: 0.1 10*3/uL (ref 0.0–0.1)
Basophils Relative: 0.7 % (ref 0.0–3.0)
EOS ABS: 0 10*3/uL (ref 0.0–0.7)
Eosinophils Relative: 0.1 % (ref 0.0–5.0)
HCT: 42.3 % (ref 36.0–46.0)
Hemoglobin: 13.7 g/dL (ref 12.0–15.0)
LYMPHS ABS: 1.9 10*3/uL (ref 0.7–4.0)
Lymphocytes Relative: 11.1 % — ABNORMAL LOW (ref 12.0–46.0)
MCHC: 32.4 g/dL (ref 30.0–36.0)
MCV: 83 fl (ref 78.0–100.0)
Monocytes Absolute: 1.1 10*3/uL — ABNORMAL HIGH (ref 0.1–1.0)
Monocytes Relative: 6.6 % (ref 3.0–12.0)
NEUTROS ABS: 14.1 10*3/uL — AB (ref 1.4–7.7)
NEUTROS PCT: 81.5 % — AB (ref 43.0–77.0)
PLATELETS: 379 10*3/uL (ref 150.0–400.0)
RBC: 5.1 Mil/uL (ref 3.87–5.11)
RDW: 14.7 % (ref 11.5–15.5)
WBC: 17.3 10*3/uL — ABNORMAL HIGH (ref 4.0–10.5)

## 2018-03-11 LAB — MAGNESIUM: Magnesium: 1.6 mg/dL (ref 1.5–2.5)

## 2018-03-11 LAB — POTASSIUM: POTASSIUM: 3.9 meq/L (ref 3.5–5.1)

## 2018-03-11 MED ORDER — ALBUTEROL SULFATE (2.5 MG/3ML) 0.083% IN NEBU: 3.0000 mL | INHALATION_SOLUTION | RESPIRATORY_TRACT | 5 refills | Status: DC | PRN

## 2018-03-11 MED ORDER — OMEPRAZOLE 20 MG PO CPDR: 20.0000 mg | DELAYED_RELEASE_CAPSULE | Freq: Every day | ORAL | 5 refills | Status: DC

## 2018-03-11 MED ORDER — FEXOFENADINE HCL 180 MG PO TABS: 180.0000 mg | ORAL_TABLET | Freq: Every day | ORAL | 5 refills | Status: DC

## 2018-03-11 MED ORDER — FLUTICASONE FUROATE-VILANTEROL 200-25 MCG/INH IN AEPB: 1.0000 | INHALATION_SPRAY | Freq: Every day | RESPIRATORY_TRACT | 5 refills | Status: DC

## 2018-03-11 MED ORDER — RANITIDINE HCL 150 MG PO TABS: 150.0000 mg | ORAL_TABLET | Freq: Every day | ORAL | 5 refills | Status: DC

## 2018-03-11 NOTE — Telephone Encounter (Signed)
I routed pt's OV from today to Dr. Wynelle Link through epic. I have also faxed the OV to her as well.  Called and spoke with pt letting her know this had been done. Pt expressed understanding. nothing further needed.

## 2018-03-11 NOTE — Progress Notes (Signed)
Entered in error

## 2018-03-12 LAB — RESPIRATORY ALLERGY PROFILE REGION II ~~LOC~~
ALLERGEN, CEDAR TREE, T6: 0.13 kU/L — AB
ALLERGEN, COTTONWOOD, T14: 0.12 kU/L — AB
ALLERGEN, D PTERNOYSSINUS, D1: 0.15 kU/L — AB
ALLERGEN, MULBERRY, T70: 0.14 kU/L — AB
ALLERGEN, P. NOTATUM, M1: 0.11 kU/L — AB
Allergen, A. alternata, m6: <0.1 kU/L
Allergen, Comm Silver Birch, t9: <0.1 kU/L
Bermuda Grass: 0.1 kU/L — ABNORMAL HIGH
Box Elder IgE: 0.11 kU/L — ABNORMAL HIGH
CLADOSPORIUM HERBARUM (M2) IGE: 0.11 kU/L — ABNORMAL HIGH
CLASS: 0
CLASS: 0
CLASS: 0
CLASS: 0
CLASS: 0
CLASS: 0
CLASS: 0
CLASS: 0
CLASS: 0
CLASS: 0
CLASS: 0
CLASS: 0
CLASS: 0
CLASS: 0
CLASS: 0
CLASS: 1
COCKROACH: 0.16 kU/L — AB
COMMON RAGWEED (SHORT) (W1) IGE: <0.1 kU/L
Class: 0
Class: 0
Class: 0
Class: 0
Class: 0
Class: 0
Class: 0
Class: 0
D. FARINAE: 0.23 kU/L — AB
DOG DANDER: 0.43 kU/L — AB
Elm IgE: 0.17 kU/L — ABNORMAL HIGH
IgE (Immunoglobulin E), Serum: 4023 kU/L — ABNORMAL HIGH (ref <=114)
Johnson Grass: 0.14 kU/L — ABNORMAL HIGH
Pecan/Hickory Tree IgE: 0.11 kU/L — ABNORMAL HIGH
Sheep Sorrel IgE: 0.1 kU/L — ABNORMAL HIGH
TIMOTHY GRASS: 0.11 kU/L — AB

## 2018-03-12 LAB — INTERPRETATION:

## 2018-03-13 ENCOUNTER — Inpatient Hospital Stay: Payer: Managed Care, Other (non HMO) | Attending: Hematology and Oncology | Admitting: Hematology and Oncology

## 2018-03-13 ENCOUNTER — Inpatient Hospital Stay: Payer: Managed Care, Other (non HMO)

## 2018-03-13 ENCOUNTER — Encounter: Payer: Self-pay | Admitting: Hematology and Oncology

## 2018-03-13 ENCOUNTER — Telehealth: Payer: Self-pay | Admitting: Hematology and Oncology

## 2018-03-13 VITALS — BP 134/74 | HR 76 | Temp 97.7°F | Resp 18 | Ht 66.0 in | Wt 285.0 lb

## 2018-03-13 DIAGNOSIS — F17210 Nicotine dependence, cigarettes, uncomplicated: Secondary | ICD-10-CM

## 2018-03-13 DIAGNOSIS — Z79899 Other long term (current) drug therapy: Secondary | ICD-10-CM | POA: Insufficient documentation

## 2018-03-13 DIAGNOSIS — K219 Gastro-esophageal reflux disease without esophagitis: Secondary | ICD-10-CM

## 2018-03-13 DIAGNOSIS — E785 Hyperlipidemia, unspecified: Secondary | ICD-10-CM | POA: Diagnosis not present

## 2018-03-13 DIAGNOSIS — Z87891 Personal history of nicotine dependence: Secondary | ICD-10-CM

## 2018-03-13 DIAGNOSIS — K30 Functional dyspepsia: Secondary | ICD-10-CM | POA: Diagnosis not present

## 2018-03-13 DIAGNOSIS — J432 Centrilobular emphysema: Secondary | ICD-10-CM | POA: Insufficient documentation

## 2018-03-13 DIAGNOSIS — M1990 Unspecified osteoarthritis, unspecified site: Secondary | ICD-10-CM | POA: Diagnosis not present

## 2018-03-13 DIAGNOSIS — R0602 Shortness of breath: Secondary | ICD-10-CM | POA: Insufficient documentation

## 2018-03-13 DIAGNOSIS — F329 Major depressive disorder, single episode, unspecified: Secondary | ICD-10-CM | POA: Insufficient documentation

## 2018-03-13 DIAGNOSIS — Z8711 Personal history of peptic ulcer disease: Secondary | ICD-10-CM | POA: Insufficient documentation

## 2018-03-13 DIAGNOSIS — D72829 Elevated white blood cell count, unspecified: Secondary | ICD-10-CM

## 2018-03-13 DIAGNOSIS — I509 Heart failure, unspecified: Secondary | ICD-10-CM | POA: Insufficient documentation

## 2018-03-13 LAB — CBC WITH DIFFERENTIAL (CANCER CENTER ONLY)
Abs Immature Granulocytes: 0.07 10*3/uL (ref 0.00–0.07)
Basophils Absolute: 0.1 10*3/uL (ref 0.0–0.1)
Basophils Relative: 0 %
EOS ABS: 0.4 10*3/uL (ref 0.0–0.5)
Eosinophils Relative: 2 %
HCT: 42.5 % (ref 36.0–46.0)
HEMOGLOBIN: 13.2 g/dL (ref 12.0–15.0)
IMMATURE GRANULOCYTES: 0 %
Lymphocytes Relative: 25 %
Lymphs Abs: 4.3 10*3/uL — ABNORMAL HIGH (ref 0.7–4.0)
MCH: 26.7 pg (ref 26.0–34.0)
MCHC: 31.1 g/dL (ref 30.0–36.0)
MCV: 85.9 fL (ref 80.0–100.0)
MONOS PCT: 6 %
Monocytes Absolute: 1 10*3/uL (ref 0.1–1.0)
NEUTROS PCT: 67 %
Neutro Abs: 11.5 10*3/uL — ABNORMAL HIGH (ref 1.7–7.7)
Platelet Count: 347 10*3/uL (ref 150–400)
RBC: 4.95 MIL/uL (ref 3.87–5.11)
RDW: 14.3 % (ref 11.5–15.5)
WBC Count: 17.3 10*3/uL — ABNORMAL HIGH (ref 4.0–10.5)
nRBC: 0 % (ref 0.0–0.2)

## 2018-03-13 LAB — COMPREHENSIVE METABOLIC PANEL
ALBUMIN: 3.4 g/dL — AB (ref 3.5–5.0)
ALK PHOS: 65 U/L (ref 38–126)
ALT: 13 U/L (ref 0–44)
ANION GAP: 11 (ref 5–15)
AST: 15 U/L (ref 15–41)
BUN: 10 mg/dL (ref 6–20)
CALCIUM: 9.6 mg/dL (ref 8.9–10.3)
CO2: 28 mmol/L (ref 22–32)
CREATININE: 1 mg/dL (ref 0.44–1.00)
Chloride: 105 mmol/L (ref 98–111)
GFR calc Af Amer: >60 mL/min (ref >60)
GFR calc non Af Amer: >60 mL/min (ref >60)
Glucose, Bld: 93 mg/dL (ref 70–99)
Potassium: 3.6 mmol/L (ref 3.5–5.1)
SODIUM: 144 mmol/L (ref 135–145)
Total Bilirubin: 0.4 mg/dL (ref 0.3–1.2)
Total Protein: 7.2 g/dL (ref 6.5–8.1)

## 2018-03-13 LAB — C-REACTIVE PROTEIN: CRP: 1.2 mg/dL — ABNORMAL HIGH (ref <1.0)

## 2018-03-13 LAB — TECHNOLOGIST SMEAR REVIEW

## 2018-03-13 LAB — URIC ACID: Uric Acid, Serum: 7.1 mg/dL (ref 2.5–7.1)

## 2018-03-13 NOTE — Progress Notes (Signed)
[facility] Initial Consultation  Patient Name:  Kelsey Morgan  DOB: 20-Jul-1970   Date of Service: March 13, 2018  Referring Provider: Donald Prose, [address], [place] [address]   Consulting Physician: Henreitta Leber, MD Hematology/Oncology  Reason for Referral: In the setting of leukocytosis/neutrophilia over over an indeterminate period without obvious explanation, she presents now for further diagnostic and therapeutic recommendations.  History Present Illness: Kelsey Morgan is a 47 year old resident of [place] whose past medical history is significant for degenerative joint disease involving the left knee and lower back; gouty arthritis involving the left foot; elevated BMI; chronic depression; long-standing tobacco dependence, discontinued 2 weeks earlier; dyslipidemia; chronic sinusitis, now quiescent; gastroesophageal reflux disease; and most recently was hospitalized on September 26 with bronchial pneumonia.   For nearly 3 weeks she experienced progressive dyspnea and productive cough, fever, and wheezing with green sputum.  She was seen on several occasions in the "walk-in clinic" at [facility] and outpatient antibiotics were prescribed.  When she failed to improve, she was sent to the emergency department for further evaluation and admission.  On September 26, a complete blood count showed hemoglobin 14.9 hematocrit 42.8 WBC 18.4; platelets 367,000. TSH 0.898 ESR 39 CRP 4.6 serum glucose 257 BUN 7 creatinine 1.09.  On broad-spectrum antibiotics and corticosteroids, from September 28 a complete blood count showed hemoglobin 13.3 hematocrit 41.2 WBC 15.2 with 82% neutrophils 10% lymphocytes 8% monocytes; platelets 391,000.  On March 01, 2018 a complete blood count showed hemoglobin 13.7 hematocrit 42.7 WBC 16.0; platelets 388,000.  Because of increasing pain, swelling, and tenderness of her left knee, she  now wears a brace.  A steroid injection was given several days prior to this visit.  She was told that the x-rays of the left knee showed no evidence of "bone destruction."  There is been no improvement in her pain.  A CT thoracic angiogram on September 26 failed to reveal any evidence of pulmonary embolism.  Likewise a left lower extremity duplex venous Doppler showed no thrombosis.  The CT scan however, showed bilateral hilar and peribronchial lymphadenopathy along with shotty mediastinal lymph nodes and suspected mild interstitial pulmonary edema.  She was seen and evaluated by Dr. June Leap, pulmonary medicine.  It is suspected that these represent reactive lymph nodes, likely associated with her acute and active bronchial pneumonia.  See Dr. Juline Patch consultation for details.  A repeat CT scan is planned in December.  On Oct 17, 2016 she was admitted through the emergency department for chest pain.  Her troponin level was mildly elevated with an EKG showing nonspecific changes.  She was referred to cardiology and an outpatient stress test was recommended. At that time a complete blood count showed hemoglobin 12.9 hematocrit 39.9 MCV 83.6 MCH 27 RDW 14.6 WBC 15.9 with 63% neutrophils 28% lymphocytes 5% monocytes 4% eosinophils; platelets 361,000.    On March 28, 2017 a complete blood count showed hemoglobin 14.3 hematocrit 43.0 WBC 13.2; platelets 339,000.  On November 03, 2017 a complete blood count showed hemoglobin 13.7 hematocrit 43.6 WBC 11.9; platelets 326,000.  A CT thoracic angiogram at that time failed to reveal any evidence of pulmonary embolism.  She continued to smoke 1 pack of cigarettes daily.   Her energy level has slowly improved since being discharged from the hospital.  Over the past 6 months her appetite is stable while her weight is decreased 20 pounds.  She denies any rash or itching.  She has no unusual headaches, dizziness, lightheadedness, syncope, or near syncopal episodes.  She  has no visual changes or hearing deficit.  She reports no chest or abdominal pain.  She continues to have a productive cough with green sputum decreased since her hospitalization.    Over the last several months she has had increasing exertional dyspnea.  She denies pain or difficulty in swallowing.  No fever, shaking chills, sweats, or flulike symptoms are evident.  She still has occasional heartburn and indigestion.  There is no nausea, vomiting, or constipation.  She has over the past 4 days loose to watery stool numbering 2 times daily.  She denies melena or bright red blood per rectum.  There is no urinary frequency, urgency, hematuria, dysuria.  She has no swelling of the ankles.  She denies numbness or tingling in the fingers or toes.  She has no diabetes mellitus, primary hypertension, or cardiac dysrhythmia.  She has been treated for congestive heart failure and is followed by Gwynneth Macleod, cardiology.  She reports no seizure disorder or stroke syndrome.  She has peptic ulcer disease identified in the 1990s and more recently is being treated for gastroesophageal reflux disease.  She has chronic degenerative joint disease involving the lower back and left knee.  At one time she had pain in the left foot and was told she has gouty arthritis.  She has no rheumatoid arthritis.  She has no peripheral arterial venous thromboembolic disease.  It is with this background she presents now for further diagnostic and therapeutic recommendations in the setting of leukocytosis/neutrophilia over an indeterminate period of time without obvious explanation.  Past Medical History:  Diagnosis Date  . Arthritis   . Back pain   . Bronchitis   . CHF (congestive heart failure) ([place])   . Gout   . Pneumonia    Past Surgical History:  Procedure Laterality Date  . ABDOMINAL HYSTERECTOMY    . ANKLE SURGERY    . CHOLECYSTECTOMY    . EXCISION MASS NECK N/A 11/12/2017   Procedure: EXCISION POSTERIOR  NECK CYST;   Surgeon: Ralene Ok, MD;  Location: [facility];  Service: General;  Laterality: N/A;  . FOOT SURGERY    . FRACTURE SURGERY    . KNEE SURGERY     Gynecologic History: Her menarche was at age 78 years. Her last menses was at age 9 years. She is nulliparous. She was on oral contraception from ages 81 to 72 years. Her most recent screening mammogram was earlier this year, reportedly normal. She has never had a breast biopsy. Her last Pap smear was 4 years ago.  Family History  Problem Relation Age of Onset  . Hypertension Mother   . Kidney failure Mother   . Heart disease Mother        Onset in her 50s   Social History   Socioeconomic History  . Marital status: Single    Spouse name: Not on file  . Number of children: Not on file  . Years of education: Not on file  . Highest education level: Not on file  Occupational History  . Occupation: [employer] operational officer  . Financial resource strain: Not on file  . Food insecurity:    Worry: Not on file    Inability: Not on file  . Transportation needs:    Medical: Not on file    Non-medical: Not on file  Tobacco Use  . Smoking status: Former Smoker    Packs/day: 0.25  Years: 25.00    Pack years: 6.25    Types: Cigarettes    Last attempt to quit: 01/01/2018    Years since quitting: 0.1  . Smokeless tobacco: Never Used  Substance and Sexual Activity  . Alcohol use: No  . Drug use: No  . Sexual activity: Not Currently  Lifestyle  . Physical activity:    Days per week: Not on file    Minutes per session: Not on file  . Stress: Not on file  Relationships  . Social connections:    Talks on phone: Not on file    Gets together: Not on file    Attends religious service: Not on file    Active member of club or organization: Not on file    Attends meetings of clubs or organizations: Not on file    Relationship status: Not on file  . Intimate partner violence:    Fear of current or ex partner: Not on file     Emotionally abused: Not on file    Physically abused: Not on file    Forced sexual activity: Not on file  Other Topics Concern  . Not on file  Social History Narrative   Lives with partner.    Fraida is single, never married. She works as a Programmer, systems. She began smoking at the age of 1 years. She smoked 1 pack of cigarettes daily. She stopped smoking 2 months ago. Her alcohol intake is rare, never heavy.  Transfusion History: No prior transfusion  Exposure History: No known exposure to toxic chemicals, radiation, or pesticides.  Allergies  Allergen Reactions  . Hydrocodone Itching  . Percocet [Oxycodone-Acetaminophen] Itching  . Vicodin [Hydrocodone-Acetaminophen] Itching    Tolerates with Benadryl. Tolerates acetaminophen alone.  Yellow dye 5& 6 cause hives She has nonspecific seasonal allergies  Current Outpatient Medications on File Prior to Visit  Medication Sig  . albuterol (PROVENTIL) (2.5 MG/3ML) 0.083% nebulizer solution Take 3 mLs by nebulization every 4 (four) hours as needed for wheezing or shortness of breath.  Marland Kitchen atorvastatin (LIPITOR) 40 MG tablet Take 1 tablet (40 mg total) by mouth daily at 6 PM. (Patient taking differently: Take 40 mg by mouth at bedtime. )  . benzonatate (TESSALON) 200 MG capsule Take 200 mg by mouth 3 (three) times daily as needed for cough.  . diclofenac sodium (VOLTAREN) 1 % GEL Apply 1 application topically 3 (three) times daily. To left knee  . fexofenadine (ALLEGRA) 180 MG tablet Take 1 tablet (180 mg total) by mouth daily.  . fluticasone (FLONASE) 50 MCG/ACT nasal spray Place 2 sprays into both nostrils daily.  . fluticasone furoate-vilanterol (BREO ELLIPTA) 200-25 MCG/INH AEPB Inhale 1 puff into the lungs daily.  . furosemide (LASIX) 40 MG tablet Take 1 tablet (40 mg total) by mouth daily.  Marland Kitchen guaiFENesin (MUCINEX) 600 MG 12 hr tablet Take 600 mg by mouth 2 (two) times daily.  Marland Kitchen HYDROMET 5-1.5 MG/5ML syrup Take 5 mLs  by mouth every 6 (six) hours as needed for cough.   . Multiple Vitamin (MULTIVITAMIN WITH MINERALS) TABS tablet Take 1 tablet by mouth daily.  Marland Kitchen omeprazole (PRILOSEC) 20 MG capsule Take 1 capsule (20 mg total) by mouth daily.  . potassium chloride SA (K-DUR,KLOR-CON) 20 MEQ tablet Take 1 tablet (20 mEq total) by mouth daily.  . ranitidine (ZANTAC) 150 MG tablet Take 1 tablet (150 mg total) by mouth at bedtime.  . sertraline (ZOLOFT) 100 MG tablet Take 100 mg by mouth at  bedtime.    No current facility-administered medications on file prior to visit.     Review of Systems: Constitutional: No fever, sweats, or shaking chills.  Her appetite is fair; 20 pound weight loss over the past 6 months. Skin: No rash, scaling, sores, lumps, or jaundice. HEENT: No visual changes or hearing deficit; chronic sinusitis now quiescent. Pulmonary: Persistent cough with green productive sputum.  No sore throat.she sleeps in a recliner (orthopnea); DOE; COPD. Breasts: No complaints. Cardiovascular: No angina or myocardial infarction.  Flash congestive heart failure.  No cardiac dysrhythmia; no essential hypertension; dyslipidemia. Gastrointestinal: No indigestion, dysphagia, abdominal pain, or constipation.  No change in bowel habits.  Loose stool over the past 3 days.  No nausea or vomiting.  No melena or bright red blood per rectum; GERD. Genitourinary: No urinary frequency, urgency, hematuria, or dysuria. Musculoskeletal: Painful and swollen left knee.  Chronic lower back pain.  No new arthralgias or myalgias; no instability. Hematologic: No bleeding tendency or easy bruisability. Endocrine: No intolerance to heat or cold; no thyroid disease or diabetes mellitus. Vascular: No peripheral arterial or venous thromboembolic disease. Psychological: Chronic depression; no mood changes. Neurological: No dizziness, lightheadedness, syncope, or near syncopal episodes; no numbness or tingling in the fingers or  toes.  Physical Examination: Vital Signs: Body surface area is 2.45 meters squared.  Vitals:   03/13/18 1241  BP: 134/74  Pulse: 76  Resp: 18  Temp: 97.7 F (36.5 C)  SpO2: 98%    Filed Weights   03/13/18 1241  Weight: 285 lb (129.3 kg)  ECOG PERFORMANCE STATUS: 1 Constitutional:  Kelsey Morgan is a fully nourished and developed albeit overweight African-American.  She looks age appropriate.  She is friendly and cooperative without respiratory compromise at rest. Skin: No rashes, scaling, dryness, jaundice, or itching. HEENT: Head is normocephalic and atraumatic.  Pupils are equal round and reactive to light and accommodation.  Sclerae are anicteric.  Conjunctivae are pink.  No sinus tenderness nor oropharyngeal lesions.  Lips without cracking or peeling; tongue without mass, inflammation, or nodularity.  Mucous membranes are moist. Neck: Supple and symmetric.  No jugular venous distention or thyromegaly.  Trachea is midline. Lymphatics: No cervical or supraclavicular lymphadenopathy.  No epitrochlear, axillary, or inguinal lymphadenopathy is appreciated. Respiratory/chest: Thorax is symmetrical.  She has inspiratory coughing.  Breath sounds are clear to auscultation and percussion.  Normal excursion and respiratory effort. Back: Symmetric without deformity or tenderness. Cardiovascular: Heart rate and rhythm are regular without murmurs or gallops. Gastrointestinal: Abdomen is soft, nontender; no organomegaly.  Bowel sounds are normoactive.  No masses are appreciated. Rectal examination: Not performed. Extremities: The left knee is swollen and tender.  In the ankles, there is no asymmetric swelling, erythema, tenderness, or cord formation.  No clubbing, cyanosis, nor edema. Hematologic: No petechiae, hematomas, or ecchymoses. Psychological:  She is oriented to person, place, and time; normal affect. Neurological: There are no gross neurologic deficits.  Laboratory Results: I have  reviewed the data as listed: CBC Latest Ref Rng & Units 03/13/2018 03/11/2018 03/05/2018  WBC 4.0 - 10.5 K/uL 17.3(H) 17.3(H) 14.8(H)  Hemoglobin 12.0 - 15.0 g/dL 13.2 13.7 15.3(H)  Hematocrit 36.0 - 46.0 % 42.5 42.3 49.3(H)  Platelets 150 - 400 K/uL 347 379.0 363    CMP Latest Ref Rng & Units 03/13/2018 03/11/2018 03/05/2018  Glucose 70 - 99 mg/dL 93 - 126(H)  BUN 6 - 20 mg/dL 10 - 9  Creatinine 0.44 - 1.00 mg/dL 1.00 - 1.23(H)  Sodium 135 - 145 mmol/L 144 - 136  Potassium 3.5 - 5.1 mmol/L 3.6 3.9 3.7  Chloride 98 - 111 mmol/L 105 - 104  CO2 22 - 32 mmol/L 28 - 22  Calcium 8.9 - 10.3 mg/dL 9.6 - 8.9  Total Protein 6.5 - 8.1 g/dL 7.2 - -  Total Bilirubin 0.3 - 1.2 mg/dL 0.4 - -  Alkaline Phos 38 - 126 U/L 65 - -  AST 15 - 41 U/L 15 - -  ALT 0 - 44 U/L 13 - -   Diagnostic/Imaging Studies: February 06, 2018 CT ANGIOGRAPHY CHEST WITH CONTRAST  TECHNIQUE: Multidetector CT imaging of the chest was performed using the standard protocol during bolus administration of intravenous contrast. Multiplanar CT image reconstructions and MIPs were obtained to evaluate the vascular anatomy.  CONTRAST:  156m ISOVUE-370 IOPAMIDOL (ISOVUE-370) INJECTION 76%  COMPARISON:  Chest radiograph 02/06/2018  FINDINGS: Cardiovascular: Satisfactory opacification of the pulmonary arteries to the segmental level. No evidence of pulmonary embolism. Normal heart size. No pericardial effusion.  Mediastinum/Nodes: Bilateral hilar and peribronchial lymph nodes. Shotty mediastinal lymph nodes. Normal appearance of the trachea, esophagus and thyroid gland.  Lungs/Pleura: Upper lobe predominant centrilobular emphysema. Mild crowding of the interstitium. Small hazy right upper lobe subpleural airspace opacities, likely inflammatory.  Upper Abdomen: Post cholecystectomy.  No acute findings.  Musculoskeletal: No chest wall abnormality. No acute or significant osseous findings.  Review of the MIP  images confirms the above findings.  IMPRESSION: No evidence of pulmonary embolus.  Bilateral hilar and peribronchial lymphadenopathy with uncertain etiology. Associated shotty mediastinal lymph nodes.  Upper lobe predominant centrilobular emphysema.  Mild crowding of the interstitium which may be due to mild interstitial pulmonary edema or hypoinflation.  DFidela SalisburyM.D. 02/06/2018 20:08  Summary/Assessment: In the setting of leukocytosis/neutrophilia over over an indeterminate period without obvious explanation, she presents now for further diagnostic and therapeutic recommendations.  PCarlettewas most recently hospitalized on September 26 with bronchial pneumonia. For nearly 3 weeks she experienced progressive dyspnea and productive cough, fever, and wheezing with green sputum.  [facility].  When she failed to improve, [facility].  On September 26, a complete blood count showed hemoglobin 14.9 hematocrit 42.8 WBC 18.4; platelets 367,000. TSH 0.898 ESR 39 CRP 4.6 serum glucose 257 BUN 7 creatinine 1.09.  On broad-spectrum antibiotics and corticosteroids, from September 28 a complete blood count showed hemoglobin 13.3 hematocrit 41.2 WBC 15.2 with 82% neutrophils 10% lymphocytes 8% monocytes; platelets 391,000.  On March 01, 2018 a complete blood count showed hemoglobin 13.7 hematocrit 42.7 WBC 16.0; platelets 388,000.    Because of increasing pain, swelling, and tenderness of her left knee, she now wears a brace.  A steroid injection was given several days prior to this visit.  She was told that the x-rays of the left knee showed no evidence of "bone destruction."  There has been no improvement in her pain.  A CT thoracic angiogram on September 26 failed to reveal any evidence of pulmonary  embolism.  Likewise a left lower extremity duplex venous Doppler showed no thrombosis.  The CT scan however, showed bilateral hilar and peribronchial lymphadenopathy along with shotty mediastinal lymph nodes and suspected mild interstitial pulmonary edema.  She was seen and evaluated by Dr. BJune Leap pulmonary medicine.  It is suspected that these represent reactive  lymph nodes associated with her acute and active bronchial pneumonia.  See Dr. Juline Patch consultation for details.  A repeat CT scan is planned in December.  By way of previous history, on Oct 17, 2016 she was admitted through the emergency department for chest pain.  Her troponin level was mildly elevated with an EKG showing nonspecific changes.  She was referred to cardiology and an outpatient stress test was recommended. At that time a complete blood count showed hemoglobin 12.9 hematocrit 39.9 MCV 83.6 MCH 27 RDW 14.6 WBC 15.9 with 63% neutrophils 28% lymphocytes 5% monocytes 4% eosinophils; platelets 361,000.  On March 28, 2017 a complete blood count showed hemoglobin 14.3 hematocrit 43.0 WBC 13.2; platelets 339,000.  On November 03, 2017 a complete blood count showed hemoglobin 13.7 hematocrit 43.6 WBC 11.9; platelets 326,000.  A CT thoracic angiogram at that time failed to reveal any evidence of pulmonary embolism.   Her energy level has slowly improved since being discharged from the hospital.  Over the past 6 months her appetite is stable while her weight is decreased 20 pounds.  She denies any rash or itching.  She has no unusual headaches, dizziness, lightheadedness, syncope, or near syncopal episodes.  She has no visual changes or hearing deficit.  She reports no chest or abdominal pain.  She continues to have a productive cough with green sputum decreased since her hospitalization.  She still gets easily winded with exertion.  She sleeps in a recliner due to orthopnea.  All very good very good I can appreciate that myself I appreciate  you calling is a busy good  Over the last several months she has had increasing exertional dyspnea.  She denies pain or difficulty in swallowing.  No fever, shaking chills, sweats, or flulike symptoms are evident.  She still has occasional heartburn and indigestion.  There is no nausea, vomiting, or constipation.  She has over the past 4 days loose to watery stool numbering 2 times daily.  She denies melena or bright red blood per rectum.  There is no urinary frequency, urgency, hematuria, dysuria.  She has no swelling of the ankles.  She denies numbness or tingling in the fingers or toes.  She has no diabetes mellitus, primary hypertension, or cardiac dysrhythmia.  She has been treated for congestive heart failure and is followed by Gwynneth Macleod, cardiology.  She reports no seizure disorder or stroke syndrome.  She has peptic ulcer disease identified in the 1990s and more recently is being treated for gastroesophageal reflux disease.  She has chronic degenerative joint disease involving the lower back and left knee.  At one time she had pain in the left foot and was told she has gouty arthritis.  She has no rheumatoid arthritis.  She has no peripheral arterial or venous thromboembolic disease.   Her other comorbid problems include degenerative joint disease involving the left knee and lower back; elevated BMI; chronic depression; long-standing tobacco dependence, discontinued 2 weeks earlier; dyslipidemia; chronic sinusitis, now quiescent; and gastroesophageal reflux disease.  Recommendation/Plan: The results of today's laboratory studies are not available at the time of discharge.  Some of those results are outlined above.  The leukocytosis without anemia or thrombocytosis/thrombocytopenia persists.  Those results will be discussed in detail the time of her next visit.  Laboratory studies were done today to exclude a primary versus a reactive leukocytosis. She continues to cough with green  productive sputum now improved since her hospital discharge.  Because of her recent bronchial pneumonia, she  was on corticosteroids both during her hospital admission and received a steroid injection just 2 days prior to today's visit.  These drugs and interventions can affect her white blood cell count.  We did review in lengthy detail both her history and prior laboratory studies which suggest leukocytosis over lengthy period.  Certainly a recent infection can be contributing to her persistent leukocytosis.  Either her leukocytosis is secondary to an underlying infectious/inflammatory process or she may have an underlying myeloproliferative process.  Laboratory studies were obtained today to exclude an underlying myeloproliferative disease.    Not uncommonly cigarette smoking can cause mild leukocytosis.  It is my suspicion that there exists a combination of factors, not cigarette smoking alone contributing to her leukocytosis and left shift.  She stopped smoking 2 weeks earlier.  Barring any unforeseen complications, her next scheduled doctor visit with laboratory studies will be on October 28.  She was advised to call us in the interim should any new or untoward problems arise.  The total time spent discussing her previous laboratory studies, recent hospitalization in September and May, role, rationale, and methodology for evaluating an elevated white blood cell count,  preliminary considerations and recommendations was 50 minutes.  At least 50% of the time was spent in counseling, and answering questions.   This note was dictated using voice activated technology/software.  Unfortunately, typographical errors are not uncommon, and transcription is subject to mistakes and regrettably misinterpretation.  If necessary, clarification of the above information can be discussed with me at any time.  Thank you Drs. Nancy Fetter and Icard for allowing my participation in the care of Kelsey Morgan. I will keep you  closely informed as the results of her preliminary laboratory data become available.  Please do not hesitate to call should any questions arise regarding this initial consultation and discussion.  FOLLOW UP: AS DIRECTED   cc:    Donald Prose MD          June Leap MD          Minus Breeding, MD  Henreitta Leber, MD  Hematology/Oncology [facility] [address] [place], [place] [address] Office: [phone] NUUV: [phone]

## 2018-03-13 NOTE — Telephone Encounter (Signed)
Scheduled appt per 10/11 los - gave patient AVS and calender per los.

## 2018-03-13 NOTE — Patient Instructions (Signed)
We discussed in detail your recent hospital history with pneumonia, and a painful left knee which required a recent steroid injection.  It is strongly recommended that should continue to abstain from cigarette smoking since this can affect your white blood cell count.  The results of your prior laboratory studies were also discussed in detail.  Although your white blood cell count can be elevated because of recent infection/inflammation, it does not appear to have decreased as one would expect following an acute bronchial pneumonia.  To exclude an underlying primary explanation for your elevated white blood cell count, laboratory studies will be obtained today to exclude an underlying primary versus reactive problem.  If your cough does not significantly improve within the next 7-10 days, a follow-up should be scheduled with your pulmonary doctor.  If your cough or breathing worsens, you should obviously call him sooner.  Barring any unforeseen complications, your next scheduled doctor visit with blood test (CBC) before that visit is on October 28.  Please come about 30 minutes prior to your scheduled visit.  Please do not hesitate to call should any questions or problems arise in the interim.  Thank you! Debbe Odea, MD Hematology/Oncology [phone]

## 2018-03-17 ENCOUNTER — Ambulatory Visit: Payer: Managed Care, Other (non HMO) | Admitting: Pulmonary Disease
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
## 2018-03-19 LAB — LEUKOCYTE ALKALINE PHOSPHATASE: Leukocyte Alkaline  Phos Stain: 26 (ref 25–130)

## 2018-03-22 NOTE — Progress Notes (Signed)
Cardiology Office Note   Date:  03/23/2018   ID:  Kelsey Morgan, DOB June 26, 1970, MRN [id number]  PCP:  Deatra Moe Graca, MD  Cardiologist:   Rollene Rotunda, MD    Chief Complaint  Patient presents with  . Shortness of Breath      History of Present Illness: Kelsey Morgan is a 47 y.o. female with no prior cardiac history who recently presented on 11/08/2016 with chest pain. D-dimer was elevated, however CT angiogram was negative for PE. EKG showed nonspecific ST changes and prolonged QTC. Troponin was borderline elevated at 0.04. She reported recent history of bronchitis and was finishing a course of steroid taper. Her symptom was very atypical for angina. Echocardiogram obtained on 10/18/2016 showed EF 55-60%, grade 1 diastolic dysfunction. She was discharged to follow-up with cardiology as outpatient and for outpatient Myoview. She underwent Myoview on 10/31/2016, this showed EF 64%, no reversible ischemia, overall considered low risk study.   Last month she had increased SOB.  She was in the hospital.  I saw her in consultation at that time.   She was treated for acute diastolic HF.  She was again in the hospital for atypical chest pain in early October. I reviewed these records for this visit.    This was not thought to be cardiac.   Of note she has been seen by pulmonology for her SOB and is being managed for possible reflux and with bronchodilators.   She says that since she stopped smoking and has been treated for some lung problems she feels much better.  She thinks her breathing is much better than it has been in a long time.  She is not having any shortness of breath, PND or orthopnea.  She has no palpitations, presyncope or syncope.  She is had a little weight gain as she is eating a bit more as she is off cigarettes.  She is not having any new edema.   She presents today for follow-up.  Patient presents today for cardiology office visit. She drives a truck. She says chest pain  first started roughly 2 weeks prior to the ED presentation, since then, she has not had any chest discomfort. The initial chest pain never correlated with exertion. She says occasionally she has lower extremity edema if she sits in the truck for too long, otherwise the edema goes away by itself with leg elevation. She does not appear to have any sign of heart failure on physical exam. I do not think she needed a scheduled diuretic medication. No further workup this is expected in this case. She can follow-up with cardiology on an as-needed basis.   Past Medical History:  Diagnosis Date  . Arthritis   . Back pain   . Bronchitis   . CHF (congestive heart failure) (HCC)   . Gout   . Pneumonia     Past Surgical History:  Procedure Laterality Date  . ABDOMINAL HYSTERECTOMY    . ANKLE SURGERY    . CHOLECYSTECTOMY    . EXCISION MASS NECK N/A 11/12/2017   Procedure: EXCISION POSTERIOR  NECK CYST;  Surgeon: Axel Filler, MD;  Location: [facility] OR;  Service: General;  Laterality: N/A;  . FOOT SURGERY    . FRACTURE SURGERY    . KNEE SURGERY       Current Outpatient Medications  Medication Sig Dispense Refill  . atorvastatin (LIPITOR) 40 MG tablet Take 1 tablet (40 mg total) by mouth daily at 6 PM. (Patient taking  differently: Take 40 mg by mouth at bedtime. ) 30 tablet 2  . diclofenac sodium (VOLTAREN) 1 % GEL Apply 1 application topically 3 (three) times daily. To left knee 3 Tube 4  . fexofenadine (ALLEGRA) 180 MG tablet Take 1 tablet (180 mg total) by mouth daily. 30 tablet 5  . fluticasone (FLONASE) 50 MCG/ACT nasal spray Place 2 sprays into both nostrils daily.    . fluticasone furoate-vilanterol (BREO ELLIPTA) 200-25 MCG/INH AEPB Inhale 1 puff into the lungs daily. 28 each 5  . furosemide (LASIX) 40 MG tablet Take 1 tablet (40 mg total) by mouth daily. 90 tablet 3  . Multiple Vitamin (MULTIVITAMIN WITH MINERALS) TABS tablet Take 1 tablet by mouth daily.    Marland Kitchen omeprazole (PRILOSEC) 20 MG  capsule Take 1 capsule (20 mg total) by mouth daily. 30 capsule 5  . potassium chloride SA (K-DUR,KLOR-CON) 20 MEQ tablet Take 1 tablet (20 mEq total) by mouth daily. 90 tablet 3  . ranitidine (ZANTAC) 150 MG tablet Take 1 tablet (150 mg total) by mouth at bedtime. 30 tablet 5  . sertraline (ZOLOFT) 100 MG tablet Take 100 mg by mouth at bedtime.      No current facility-administered medications for this visit.     Allergies:   Hydrocodone; Percocet [oxycodone-acetaminophen]; and Vicodin [hydrocodone-acetaminophen]     ROS:  Please see the history of present illness.   Otherwise, review of systems are positive for none.   All other systems are reviewed and negative.    PHYSICAL EXAM: VS:  BP (!) 141/81   Pulse 72   Ht 5\' 6"  (1.676 m)   Wt 287 lb 3.2 oz (130.3 kg)   SpO2 96%   BMI 46.36 kg/m  , BMI Body mass index is 46.36 kg/m. GENERAL:  Well appearing NECK:  No jugular venous distention, waveform within normal limits, carotid upstroke brisk and symmetric, no bruits, no thyromegaly LUNGS:  Clear to auscultation bilaterally CHEST:  Unremarkable HEART:  PMI not displaced or sustained,S1 and S2 within normal limits, no S3, no S4, no clicks, no rubs, no murmurs ABD:  Flat, positive bowel sounds normal in frequency in pitch, no bruits, no rebound, no guarding, no midline pulsatile mass, no hepatomegaly, no splenomegaly EXT:  2 plus pulses throughout, no edema, no cyanosis no clubbing     EKG:  EKG is ordered today. The ekg ordered 03/23/2018 demonstrates sinus rhythm, rate 67 , left axis deviation, poor anterior R wave progression   Recent Labs: 02/27/2018: TSH 0.898 03/05/2018: B Natriuretic Peptide 7.7 03/11/2018: Magnesium 1.6 03/13/2018: ALT 13; BUN 10; Creatinine, Ser 1.00; Hemoglobin 13.2; Platelet Count 347; Potassium 3.6; Sodium 144    Lipid Panel    Component Value Date/Time   CHOL 129 11/08/2016 0947   TRIG 162 (H) 11/08/2016 0947   HDL 36 (L) 11/08/2016 0947    CHOLHDL 3.6 11/08/2016 0947   LDLCALC 61 11/08/2016 0947      Wt Readings from Last 3 Encounters:  03/23/18 287 lb 3.2 oz (130.3 kg)  03/13/18 285 lb (129.3 kg)  03/11/18 286 lb 9.6 oz (130 kg)      Other studies Reviewed: Additional studies/ records that were reviewed today include: ED records, Pulmonary records. Review of the above records demonstrates:  Please see elsewhere in the note.     ASSESSMENT AND PLAN:  SOB:   This seems to be multifactorial and much improved.  There may be some slight component of diastolic dysfunction and that this was noted  on echo although her BNP was 7 when she was in the emergency room.  We talked about salt and fluid restriction.   I think she does well with the current dose of diuretic and potassium although as she loses weight we talked about cutting back to half that dose of both medications and seeing how she does.  She does have some swelling in her legs which may be related to the weight and the fact that she keeps her feet down driving quite a bit.  Given the negative cardiac work-up no other evaluation is warranted.  No change in therapy.  She is okay to return to her driving without cardiac restrictions.  LYMPHADENOPATHY:   She has been seen by oncology and pulmonology.  I have reviewed these records for this appt.   No cardiac work up is indicated.    PROLONGED QT:   This was borderline previously but normal now.  No change in therapy.  She has no symptoms.  No further testing.  OBESITY: We talked about a gradual weight loss.  Current medicines are reviewed at length with the patient today.  The patient does not have concerns regarding medicines.  The following changes have been made:  no change  Labs/ tests ordered today include: None No orders of the defined types were placed in this encounter.    Disposition:   FU with me as needed.     Signed, Rollene Rotunda, MD  03/23/2018 10:23 AM    [facility]

## 2018-03-23 ENCOUNTER — Ambulatory Visit: Payer: Managed Care, Other (non HMO) | Admitting: Cardiology

## 2018-03-23 ENCOUNTER — Encounter: Payer: Self-pay | Admitting: Cardiology

## 2018-03-23 VITALS — BP 141/81 | HR 72 | Ht 66.0 in | Wt 287.2 lb

## 2018-03-23 DIAGNOSIS — R0602 Shortness of breath: Secondary | ICD-10-CM | POA: Diagnosis not present

## 2018-03-23 DIAGNOSIS — R9431 Abnormal electrocardiogram [ECG] [EKG]: Secondary | ICD-10-CM

## 2018-03-23 MED ORDER — FUROSEMIDE 40 MG PO TABS: 40.0000 mg | ORAL_TABLET | Freq: Every day | ORAL | 3 refills | Status: DC

## 2018-03-23 MED ORDER — POTASSIUM CHLORIDE CRYS ER 20 MEQ PO TBCR: 20.0000 meq | EXTENDED_RELEASE_TABLET | Freq: Every day | ORAL | 3 refills | Status: AC

## 2018-03-23 NOTE — Patient Instructions (Signed)

## 2018-03-24 NOTE — Addendum Note (Signed)
Addended by: Chana Bode on: 03/24/2018 01:34 PM   Modules accepted: Orders

## 2018-03-25 LAB — BCR ABL1 FISH (GENPATH)

## 2018-03-26 ENCOUNTER — Telehealth: Payer: Self-pay | Admitting: Pulmonary Disease

## 2018-03-26 NOTE — Telephone Encounter (Signed)
Called and spoke with pt who stated there is a recall on zantac/ranitidine at pt's pharmacy.  Per pt, she was told that the recall was on for the capsules and pt stated they told her she was fine to stay on the zantac due to hers being the tablets.  Nothing further needed.

## 2018-03-30 ENCOUNTER — Inpatient Hospital Stay: Payer: Managed Care, Other (non HMO) | Admitting: Hematology and Oncology

## 2018-03-30 ENCOUNTER — Ambulatory Visit: Payer: Managed Care, Other (non HMO) | Admitting: Physical Medicine & Rehabilitation

## 2018-03-30 ENCOUNTER — Inpatient Hospital Stay: Payer: Managed Care, Other (non HMO)

## 2018-04-06 ENCOUNTER — Encounter: Payer: Managed Care, Other (non HMO) | Admitting: Physical Medicine & Rehabilitation

## 2018-04-09 ENCOUNTER — Other Ambulatory Visit: Payer: Self-pay | Admitting: Physical Medicine & Rehabilitation

## 2018-04-09 MED ORDER — DICLOFENAC SODIUM 75 MG PO TBEC: 75.0000 mg | DELAYED_RELEASE_TABLET | Freq: Two times a day (BID) | ORAL | 3 refills | Status: DC

## 2018-04-13 ENCOUNTER — Inpatient Hospital Stay: Payer: Managed Care, Other (non HMO) | Attending: Hematology and Oncology

## 2018-04-13 ENCOUNTER — Inpatient Hospital Stay: Payer: Managed Care, Other (non HMO) | Admitting: Hematology and Oncology

## 2018-04-14 ENCOUNTER — Telehealth: Payer: Self-pay | Admitting: Hematology and Oncology

## 2018-04-14 NOTE — Telephone Encounter (Signed)
Rescheduled appointment per schedule message.  Mailed Calendar for Nov 2019

## 2018-04-22 ENCOUNTER — Inpatient Hospital Stay: Payer: Managed Care, Other (non HMO)

## 2018-04-22 ENCOUNTER — Inpatient Hospital Stay: Payer: Managed Care, Other (non HMO) | Admitting: Hematology and Oncology

## 2018-04-28 ENCOUNTER — Ambulatory Visit: Payer: Managed Care, Other (non HMO) | Admitting: Pulmonary Disease

## 2018-05-04 ENCOUNTER — Encounter: Payer: Managed Care, Other (non HMO) | Admitting: Physical Medicine & Rehabilitation

## 2018-05-18 ENCOUNTER — Ambulatory Visit: Payer: Managed Care, Other (non HMO) | Admitting: Pulmonary Disease

## 2018-05-18 ENCOUNTER — Other Ambulatory Visit: Payer: Managed Care, Other (non HMO)

## 2018-06-08 ENCOUNTER — Encounter: Payer: Self-pay | Admitting: Physical Medicine & Rehabilitation

## 2018-06-08 ENCOUNTER — Encounter
Payer: Managed Care, Other (non HMO) | Attending: Physical Medicine & Rehabilitation | Admitting: Physical Medicine & Rehabilitation

## 2018-06-08 VITALS — BP 127/80 | HR 75 | Ht 66.0 in | Wt 297.0 lb

## 2018-06-08 DIAGNOSIS — Z9889 Other specified postprocedural states: Secondary | ICD-10-CM | POA: Diagnosis not present

## 2018-06-08 DIAGNOSIS — M25562 Pain in left knee: Secondary | ICD-10-CM | POA: Diagnosis present

## 2018-06-08 DIAGNOSIS — F17210 Nicotine dependence, cigarettes, uncomplicated: Secondary | ICD-10-CM | POA: Diagnosis not present

## 2018-06-08 DIAGNOSIS — G8929 Other chronic pain: Secondary | ICD-10-CM | POA: Insufficient documentation

## 2018-06-08 DIAGNOSIS — M1712 Unilateral primary osteoarthritis, left knee: Secondary | ICD-10-CM

## 2018-06-08 DIAGNOSIS — Z9049 Acquired absence of other specified parts of digestive tract: Secondary | ICD-10-CM | POA: Insufficient documentation

## 2018-06-08 DIAGNOSIS — I509 Heart failure, unspecified: Secondary | ICD-10-CM | POA: Insufficient documentation

## 2018-06-08 DIAGNOSIS — Z90710 Acquired absence of both cervix and uterus: Secondary | ICD-10-CM | POA: Diagnosis not present

## 2018-06-08 MED ORDER — TRAMADOL HCL 50 MG PO TABS: 50.0000 mg | ORAL_TABLET | Freq: Two times a day (BID) | ORAL | 1 refills | Status: DC | PRN

## 2018-06-08 NOTE — Progress Notes (Signed)
Subjective:    Patient ID: Kelsey Morgan, female    DOB: [birth year], 48 y.o.   MRN: [id number]  HPI   Kelsey Morgan is here in follow-up of her chronic left knee pain.  I last saw her on October 8.  At that time I ordered x-rays of her knee and injected the knee with Celestone.  Voltaren gel was ordered.  Left knee x-ray was unremarkable.  She has continued to have pain in the left knee when she drives, particularly when she's sitting for long periods and then has to get up and walk. It bothers her initiially in the anterior knee and then spreads posteriorly and along the inside of the leg and calf.   She denies pain elsewhere.     Pain Inventory Average Pain 9 Pain Right Now 8 My pain is sharp, burning, stabbing and aching  In the last 24 hours, has pain interfered with the following? General activity 8 Relation with others 8 Enjoyment of life 8 What TIME of day is your pain at its worst? daytime Sleep (in general) Fair  Pain is worse with: walking, bending, sitting, standing and some activites Pain improves with: heat/ice Relief from Meds: na  Mobility walk without assistance  Function employed # of hrs/week 70  Neuro/Psych weakness numbness tingling spasms  Prior Studies Any changes since last visit?  no  Physicians involved in your care Any changes since last visit?  no   Family History  Problem Relation Age of Onset  . Hypertension Mother   . Kidney failure Mother   . Heart disease Mother        Onset in her 5750s   Social History   Socioeconomic History  . Marital status: Single    Spouse name: Not on file  . Number of children: Not on file  . Years of education: Not on file  . Highest education level: Not on file  Occupational History  . Occupation: Truck Runner, broadcasting/film/videodriver  Social Needs  . Financial resource strain: Not on file  . Food insecurity:    Worry: Not on file    Inability: Not on file  . Transportation needs:    Medical: Not on file   Non-medical: Not on file  Tobacco Use  . Smoking status: Former Smoker    Packs/day: 0.25    Years: 25.00    Pack years: 6.25    Types: Cigarettes    Last attempt to quit: 01/01/2018    Years since quitting: 0.4  . Smokeless tobacco: Never Used  Substance and Sexual Activity  . Alcohol use: No  . Drug use: No  . Sexual activity: Not Currently  Lifestyle  . Physical activity:    Days per week: Not on file    Minutes per session: Not on file  . Stress: Not on file  Relationships  . Social connections:    Talks on phone: Not on file    Gets together: Not on file    Attends religious service: Not on file    Active member of club or organization: Not on file    Attends meetings of clubs or organizations: Not on file    Relationship status: Not on file  Other Topics Concern  . Not on file  Social History Narrative   Lives with partner.     Past Surgical History:  Procedure Laterality Date  . ABDOMINAL HYSTERECTOMY    . ANKLE SURGERY    . CHOLECYSTECTOMY    . EXCISION MASS  NECK N/A 11/12/2017   Procedure: EXCISION POSTERIOR  NECK CYST;  Surgeon: Axel Filleramirez, Armando, MD;  [facility];  Service: General;  Laterality: N/A;  . FOOT SURGERY    . FRACTURE SURGERY    . KNEE SURGERY     Past Medical History:  Diagnosis Date  . Arthritis   . Back pain   . Bronchitis   . CHF (congestive heart failure) (HCC)   . Gout   . Pneumonia    BP 127/80   Pulse 75   Ht 5\' 6"  (1.676 m)   Wt 297 lb (134.7 kg)   SpO2 95%   BMI 47.94 kg/m   Opioid Risk Score:   Fall Risk Score:  `1  Depression screen PHQ 2/9  Depression screen PHQ 2/9 03/10/2018  Decreased Interest 0  Down, Depressed, Hopeless 0  PHQ - 2 Score 0  Altered sleeping 3  Tired, decreased energy 3  Change in appetite 1  Feeling bad or failure about yourself  0  Trouble concentrating 0  Moving slowly or fidgety/restless 0  Suicidal thoughts 0  PHQ-9 Score 7  Difficult doing work/chores Not difficult at all      Review of Systems  Constitutional: Negative.   HENT: Negative.   Eyes: Negative.   Respiratory: Negative.   Cardiovascular: Negative.   Gastrointestinal: Negative.   Endocrine: Negative.   Genitourinary: Negative.   Musculoskeletal: Positive for arthralgias and myalgias.  Skin: Negative.   Allergic/Immunologic: Negative.   Neurological: Positive for weakness and numbness.  Hematological: Negative.   Psychiatric/Behavioral: Negative.   All other systems reviewed and are negative.      Objective:   Physical Exam  General: No acute distress, no obese.  HEENT: EOMI, oral membranes moist Cards: reg rate  Chest: normal effort Abdomen: Soft, NT, ND Skin: dry, intact Extremities: no edema Neuro: Pt is cognitively appropriate with normal insight, memory, and awareness. Cranial nerves 2-12 are intact. Sensory exam is normal. Reflexes are 2+ in all 4's. Fine motor coordination is intact. No tremors. Motor function is grossly 5/5.  Musculoskeletal: antalgia LLE. + mcmurray's left knee medial and lateral. Mild lateral effusion. No crepitus.  Psych: Pt's affect is appropriate. Pt is cooperative         Assessment & Plan:  1. Acute on chronic left knee pain. Likely degenerative joint disease with meniscus injury  Plan: 1. xrays of left knee to assess joint space and alignment 2. After informed consent and preparation of the skin with betadine and isopropyl alcohol, I injected 6mg  (1cc) of celestone and 4cc of 1% lidocaine into the left knee via anterolateral approach. Additionally, aspiration was performed prior to injection. The patient tolerated well, and no complications were encountered. Afterward the area was cleaned and dressed. Post- injection instructions were provided.   3.voltaren gel 4. Tramadol trial 50mg  q12 prn #60    Follow up in one month. 15 minutes of face to face patient care time were spent during this visit. All questions were encouraged and  answered.

## 2018-06-08 NOTE — Patient Instructions (Signed)
PLEASE FEEL FREE TO CALL OUR OFFICE WITH ANY PROBLEMS OR QUESTIONS ([phone])      

## 2018-06-10 ENCOUNTER — Telehealth: Payer: Self-pay | Admitting: *Deleted

## 2018-06-10 NOTE — Telephone Encounter (Signed)
"  Reba with [facility] [phone]).  We received orders March 13, 2018 from Dr. Caron Presume for this patient.  We need the patient's address."   Informed provider reassignment pending for future communications.  EMR reads [address] [place], [place]., [phone] per [facility]  Provided address as requested.  Denies further questions or needs at this time.

## 2018-06-12 ENCOUNTER — Other Ambulatory Visit: Payer: Self-pay | Admitting: Physical Medicine & Rehabilitation

## 2018-06-15 ENCOUNTER — Other Ambulatory Visit: Payer: Self-pay

## 2018-06-15 ENCOUNTER — Encounter (HOSPITAL_COMMUNITY): Payer: Self-pay

## 2018-06-15 ENCOUNTER — Emergency Department (HOSPITAL_BASED_OUTPATIENT_CLINIC_OR_DEPARTMENT_OTHER)
Admission: EM | Admit: 2018-06-15 | Discharge: 2018-06-15 | Disposition: A | Discharge status: Home / Self Care | Payer: Managed Care, Other (non HMO) | Source: Home / Self Care | Attending: Emergency Medicine | Admitting: Emergency Medicine

## 2018-06-15 ENCOUNTER — Emergency Department (HOSPITAL_COMMUNITY)
Admission: EM | Admit: 2018-06-15 | Discharge: 2018-06-15 | Disposition: A | Discharge status: Home / Self Care | Payer: Managed Care, Other (non HMO) | Attending: Emergency Medicine | Admitting: Emergency Medicine

## 2018-06-15 ENCOUNTER — Other Ambulatory Visit: Payer: Managed Care, Other (non HMO)

## 2018-06-15 DIAGNOSIS — I509 Heart failure, unspecified: Secondary | ICD-10-CM | POA: Diagnosis not present

## 2018-06-15 DIAGNOSIS — M609 Myositis, unspecified: Secondary | ICD-10-CM

## 2018-06-15 DIAGNOSIS — Z9049 Acquired absence of other specified parts of digestive tract: Secondary | ICD-10-CM | POA: Insufficient documentation

## 2018-06-15 DIAGNOSIS — R52 Pain, unspecified: Secondary | ICD-10-CM | POA: Diagnosis not present

## 2018-06-15 DIAGNOSIS — Z79899 Other long term (current) drug therapy: Secondary | ICD-10-CM | POA: Diagnosis not present

## 2018-06-15 DIAGNOSIS — M79605 Pain in left leg: Secondary | ICD-10-CM

## 2018-06-15 DIAGNOSIS — M60862 Other myositis, left lower leg: Secondary | ICD-10-CM | POA: Insufficient documentation

## 2018-06-15 DIAGNOSIS — Z87891 Personal history of nicotine dependence: Secondary | ICD-10-CM | POA: Insufficient documentation

## 2018-06-15 LAB — BASIC METABOLIC PANEL
Anion gap: 9 (ref 5–15)
BUN: 9 mg/dL (ref 6–20)
CALCIUM: 8.7 mg/dL — AB (ref 8.9–10.3)
CHLORIDE: 108 mmol/L (ref 98–111)
CO2: 23 mmol/L (ref 22–32)
Creatinine, Ser: 1.01 mg/dL — ABNORMAL HIGH (ref 0.44–1.00)
GFR calc Af Amer: >60 mL/min (ref >60)
GFR calc non Af Amer: >60 mL/min (ref >60)
Glucose, Bld: 82 mg/dL (ref 70–99)
Potassium: 3.6 mmol/L (ref 3.5–5.1)
Sodium: 140 mmol/L (ref 135–145)

## 2018-06-15 NOTE — ED Provider Notes (Signed)
[facility]-EMERGENCY DEPT Provider Note   CSN: [id number] Arrival date & time: 06/15/18  1252     History   Chief Complaint Chief Complaint  Patient presents with  . Leg Pain    left    HPI Kelsey Morgan is a 48 y.o. female.  HPI  48 year old female with history of CHF, gout comes in with chief complaint of left leg pain.  She reports that over the past 2 months she has been having calf pain and she has noticed increased swelling.  Her pain is fairly constant.  She is a Naval architect.  Pt has no hx of PE, DVT and denies any exogenous hormone (testosterone / estrogen) use, surgery in the past 6 weeks, active cancer, recent immobilization.   Past Medical History:  Diagnosis Date  . Arthritis   . Back pain   . Bronchitis   . CHF (congestive heart failure) (HCC)   . Gout   . Pneumonia     Patient Active Problem List   Diagnosis Date Noted  . SOB (shortness of breath) 03/23/2018  . Primary localized osteoarthritis of left knee 03/10/2018  . Acute exacerbation of CHF (congestive heart failure) (HCC) 02/27/2018  . Prolonged QT interval 02/27/2018  . Bronchitis 02/27/2018  . Lymphadenopathy, hilar 02/27/2018  . Hypokalemia 02/27/2018  . Chest pain 10/18/2016  . Leucocytosis 10/18/2016    Past Surgical History:  Procedure Laterality Date  . ABDOMINAL HYSTERECTOMY    . ANKLE SURGERY    . CHOLECYSTECTOMY    . EXCISION MASS NECK N/A 11/12/2017   Procedure: EXCISION POSTERIOR  NECK CYST;  Surgeon: Axel Filler, MD;  Location: [facility] OR;  Service: General;  Laterality: N/A;  . FOOT SURGERY    . FRACTURE SURGERY    . KNEE SURGERY       OB History   No obstetric history on file.      Home Medications    Prior to Admission medications   Medication Sig Start Date End Date Taking? Authorizing Provider  acetaminophen (TYLENOL) 325 MG tablet Take 650 mg by mouth every 6 (six) hours as needed for mild pain or headache.   Yes [provider]  atorvastatin (LIPITOR) 40 MG tablet Take 1 tablet (40 mg total) by mouth daily at 6 PM. Patient taking differently: Take 40 mg by mouth at bedtime.  10/19/16  Yes Philip Aspen, Limmie Patricia, MD  diclofenac sodium (VOLTAREN) 1 % GEL Apply 1 application topically 3 (three) times daily. To left knee 03/10/18  Yes Ranelle Oyster, MD  fexofenadine (ALLEGRA) 180 MG tablet Take 1 tablet (180 mg total) by mouth daily. 03/11/18  Yes Icard, Bradley L, DO  fluticasone (FLONASE) 50 MCG/ACT nasal spray Place 2 sprays into both nostrils daily.   Yes [provider]  fluticasone furoate-vilanterol (BREO ELLIPTA) 200-25 MCG/INH AEPB Inhale 1 puff into the lungs daily. Patient taking differently: Inhale 1 puff into the lungs daily as needed (asthma symptoms).  03/11/18  Yes Icard, Bradley L, DO  furosemide (LASIX) 40 MG tablet Take 1 tablet (40 mg total) by mouth daily. 03/23/18  Yes Rollene Rotunda, MD  ibuprofen (ADVIL,MOTRIN) 800 MG tablet Take 800 mg by mouth every 8 (eight) hours as needed for headache or mild pain.   Yes [provider]  Multiple Vitamin (MULTIVITAMIN WITH MINERALS) TABS tablet Take 1 tablet by mouth daily.   Yes [provider]  potassium chloride SA (K-DUR,KLOR-CON) 20 MEQ tablet Take 1 tablet (20  mEq total) by mouth daily. 03/23/18  Yes Rollene RotundaHochrein, James, MD  ranitidine (ZANTAC) 150 MG tablet Take 1 tablet (150 mg total) by mouth at bedtime. 03/11/18  Yes Icard, Bradley L, DO  sertraline (ZOLOFT) 100 MG tablet Take 100 mg by mouth at bedtime.    Yes [provider]  tiZANidine (ZANAFLEX) 4 MG tablet Take 4 mg by mouth at bedtime as needed for muscle spasms.   Yes [provider]  traMADol (ULTRAM) 50 MG tablet Take 1 tablet (50 mg total) by mouth every 12 (twelve) hours as needed. 06/08/18  Yes Ranelle OysterSwartz, Zachary T, MD  diclofenac (VOLTAREN) 75 MG EC tablet Take 1 tablet (75 mg total) by mouth 2 (two) times daily with a meal. Patient not taking:  Reported on 06/15/2018 04/09/18   Ranelle OysterSwartz, Zachary T, MD  omeprazole (PRILOSEC) 20 MG capsule Take 1 capsule (20 mg total) by mouth daily. 03/11/18   Josephine IgoIcard, Bradley L, DO    Family History Family History  Problem Relation Age of Onset  . Hypertension Mother   . Kidney failure Mother   . Heart disease Mother        Onset in her 450s    Social History Social History   Tobacco Use  . Smoking status: Former Smoker    Packs/day: 0.25    Years: 25.00    Pack years: 6.25    Types: Cigarettes    Last attempt to quit: 01/01/2018    Years since quitting: 0.4  . Smokeless tobacco: Never Used  Substance Use Topics  . Alcohol use: No  . Drug use: No     Allergies   Hydrocodone; Percocet [oxycodone-acetaminophen]; and Vicodin [hydrocodone-acetaminophen]   Review of Systems Review of Systems  Constitutional: Positive for activity change.  Musculoskeletal: Positive for myalgias.  Allergic/Immunologic: Negative for immunocompromised state.  Hematological: Does not bruise/bleed easily.     Physical Exam Updated Vital Signs BP (!) 126/106 (BP Location: Right Arm)   Pulse 79   Temp 97.6 F (36.4 C) (Oral)   Resp 16   Ht 5\' 6"  (1.676 m)   Wt 135 kg   SpO2 98%   BMI 48.04 kg/m   Physical Exam Vitals signs and nursing note reviewed.  Constitutional:      Appearance: She is well-developed.  HENT:     Head: Normocephalic and atraumatic.  Neck:     Musculoskeletal: Normal range of motion and neck supple.  Cardiovascular:     Rate and Rhythm: Normal rate.  Pulmonary:     Effort: Pulmonary effort is normal.  Abdominal:     General: Bowel sounds are normal.  Musculoskeletal:        General: Tenderness present.     Left lower leg: Edema present.  Skin:    General: Skin is warm and dry.  Neurological:     Mental Status: She is alert and oriented to person, place, and time.      ED Treatments / Results  Labs (all labs ordered are listed, but only abnormal results are  displayed) Labs Reviewed  BASIC METABOLIC PANEL - Abnormal; Notable for the following components:      Result Value   Creatinine, Ser 1.01 (*)    Calcium 8.7 (*)    All other components within normal limits    EKG None  Radiology Vas Koreas Lower Extremity Venous (dvt) (only [facility])  Result Date: 06/15/2018  Lower Venous Study Indications: Pain.  Limitations: Body habitus and poor ultrasound/tissue interface.  Performing Technologist: Chanda BusingGregory Collins RVT  Examination Guidelines: A complete evaluation includes B-mode imaging, spectral Doppler, color Doppler, and power Doppler as needed of all accessible portions of each vessel. Bilateral testing is considered an integral part of a complete examination. Limited examinations for reoccurring indications may be performed as noted.  Right Venous Findings: +---+---------------+---------+-----------+----------+-------+    CompressibilityPhasicitySpontaneityPropertiesSummary +---+---------------+---------+-----------+----------+-------+ CFVFull           Yes      Yes                          +---+---------------+---------+-----------+----------+-------+  Left Venous Findings: +---------+---------------+---------+-----------+----------+-------+          CompressibilityPhasicitySpontaneityPropertiesSummary +---------+---------------+---------+-----------+----------+-------+ CFV      Full           Yes      Yes                          +---------+---------------+---------+-----------+----------+-------+ SFJ      Full                                                 +---------+---------------+---------+-----------+----------+-------+ FV Prox  Full                                                 +---------+---------------+---------+-----------+----------+-------+ FV Mid   Full                                                 +---------+---------------+---------+-----------+----------+-------+ FV DistalFull                                                  +---------+---------------+---------+-----------+----------+-------+ PFV      Full                                                 +---------+---------------+---------+-----------+----------+-------+ POP      Full           Yes      Yes                          +---------+---------------+---------+-----------+----------+-------+ PTV      Full                                                 +---------+---------------+---------+-----------+----------+-------+ PERO     Full                                                 +---------+---------------+---------+-----------+----------+-------+  Summary: Right: No evidence of common femoral vein obstruction. Left: There is no evidence of deep vein thrombosis in the lower extremity. However, portions of this examination were limited- see technologist comments above. No cystic structure found in the popliteal fossa.  *See table(s) above for measurements and observations.    Preliminary     Procedures Procedures (including critical care time)  Medications Ordered in ED Medications - No data to display   Initial Impression / Assessment and Plan / ED Course  I have reviewed the triage vital signs and the nursing notes.  Pertinent labs & imaging results that were available during my care of the patient were reviewed by me and considered in my medical decision making (see chart for details).    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  Patient is noted to have slight calf tenderness along with edema.  Ultrasound DVT is negative for any acute findings. There is no clinical concerns for cellulitis.  Other possibilities include myositis -but based on my evaluation there is no concerns for any acute pathology.  I have advised patient to follow-up with the PCP.   Final Clinical Impressions(s) / ED Diagnoses   Final diagnoses:  Left leg pain  Myositis of  left lower extremity, unspecified myositis type    ED Discharge Orders    None       Derwood Kaplan, MD 06/15/18 1629

## 2018-06-15 NOTE — Discharge Instructions (Signed)
We saw in the ER for left leg pain and swelling.  Ultrasound results are normal.  There could be other causes for isolated muscle pain, and at this time we recommend that you follow-up with your primary care doctor for further evaluation.

## 2018-06-15 NOTE — ED Triage Notes (Signed)
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 Pt describes pain in left leg as a constant charley horse in calf, as well as red, and warmer than the other leg. Pt also states she has noticed edema as well. Pain is not relieved by ice or heat. Pt sent by MRI to rule out blood clot. Pt was scheduled for MRI originally, as they thought it may have been knee problems

## 2018-06-15 NOTE — Progress Notes (Signed)
Left lower extremity venous duplex has been completed. Negative for DVT. Results were given to Dr. Rhunette Croft.  06/15/18 3:03 PM Olen Cordial RVT

## 2018-07-13 ENCOUNTER — Encounter: Payer: Managed Care, Other (non HMO) | Admitting: Physical Medicine & Rehabilitation

## 2019-02-14 ENCOUNTER — Encounter (HOSPITAL_COMMUNITY): Payer: Self-pay

## 2019-02-14 ENCOUNTER — Other Ambulatory Visit: Payer: Self-pay

## 2019-02-14 ENCOUNTER — Emergency Department (HOSPITAL_COMMUNITY)
Admission: EM | Admit: 2019-02-14 | Discharge: 2019-02-15 | Disposition: A | Discharge status: Home / Self Care | Payer: Managed Care, Other (non HMO) | Attending: Emergency Medicine | Admitting: Emergency Medicine

## 2019-02-14 DIAGNOSIS — W06XXXA Fall from bed, initial encounter: Secondary | ICD-10-CM | POA: Diagnosis not present

## 2019-02-14 DIAGNOSIS — I509 Heart failure, unspecified: Secondary | ICD-10-CM | POA: Insufficient documentation

## 2019-02-14 DIAGNOSIS — S91115A Laceration without foreign body of left lesser toe(s) without damage to nail, initial encounter: Secondary | ICD-10-CM | POA: Diagnosis not present

## 2019-02-14 DIAGNOSIS — Z23 Encounter for immunization: Secondary | ICD-10-CM | POA: Insufficient documentation

## 2019-02-14 DIAGNOSIS — Y999 Unspecified external cause status: Secondary | ICD-10-CM | POA: Diagnosis not present

## 2019-02-14 DIAGNOSIS — Y9389 Activity, other specified: Secondary | ICD-10-CM | POA: Diagnosis not present

## 2019-02-14 DIAGNOSIS — Z79899 Other long term (current) drug therapy: Secondary | ICD-10-CM | POA: Insufficient documentation

## 2019-02-14 DIAGNOSIS — Z87891 Personal history of nicotine dependence: Secondary | ICD-10-CM | POA: Diagnosis not present

## 2019-02-14 DIAGNOSIS — S91119A Laceration without foreign body of unspecified toe without damage to nail, initial encounter: Secondary | ICD-10-CM

## 2019-02-14 DIAGNOSIS — Y929 Unspecified place or not applicable: Secondary | ICD-10-CM | POA: Diagnosis not present

## 2019-02-14 DIAGNOSIS — S99922A Unspecified injury of left foot, initial encounter: Secondary | ICD-10-CM | POA: Diagnosis present

## 2019-02-14 MED ORDER — ACETAMINOPHEN 500 MG PO TABS
1000.0000 mg | ORAL_TABLET | Freq: Once | ORAL | Status: AC
  Administered 2019-02-14: 23:00:00 1000 mg via ORAL
  Filled 2019-02-14: qty 2

## 2019-02-14 MED ORDER — TETANUS-DIPHTH-ACELL PERTUSSIS 5-2.5-18.5 LF-MCG/0.5 IM SUSP
0.5000 mL | Freq: Once | INTRAMUSCULAR | Status: AC
  Administered 2019-02-14: 0.5 mL via INTRAMUSCULAR
  Filled 2019-02-14: qty 0.5

## 2019-02-14 MED ORDER — LIDOCAINE HCL (PF) 1 % IJ SOLN
10.0000 mL | Freq: Once | INTRAMUSCULAR | Status: AC
  Administered 2019-02-14: 23:00:00 10 mL via INTRADERMAL

## 2019-02-14 NOTE — ED Provider Notes (Signed)
[facility]-EMERGENCY DEPT Provider Note   CSN: [id number] Arrival date & time: 02/14/19  1936     History   Chief Complaint Chief Complaint  Patient presents with  . Laceration    HPI Kelsey Morgan is a 48 y.o. female who presents for evaluation of laceration noted to left second, third, fourth toes.  Patient reports that she was hanging curtains and was standing on the bed and slipped and landed on a metal bed frame.  She states that this caused laceration to her toes.  No head injury, LOC.  She does not know when her last tetanus shot was.  She denies any numbness/weakness.     The history is provided by the patient.    Past Medical History:  Diagnosis Date  . Arthritis   . Back pain   . Bronchitis   . CHF (congestive heart failure) (HCC)   . Gout   . Pneumonia     Patient Active Problem List   Diagnosis Date Noted  . SOB (shortness of breath) 03/23/2018  . Primary localized osteoarthritis of left knee 03/10/2018  . Acute exacerbation of CHF (congestive heart failure) (HCC) 02/27/2018  . Prolonged QT interval 02/27/2018  . Bronchitis 02/27/2018  . Lymphadenopathy, hilar 02/27/2018  . Hypokalemia 02/27/2018  . Chest pain 10/18/2016  . Leucocytosis 10/18/2016    Past Surgical History:  Procedure Laterality Date  . ABDOMINAL HYSTERECTOMY    . ANKLE SURGERY    . CHOLECYSTECTOMY    . EXCISION MASS NECK N/A 11/12/2017   Procedure: EXCISION POSTERIOR  NECK CYST;  Surgeon: Axel Filler, MD;  Location: [facility] OR;  Service: General;  Laterality: N/A;  . FOOT SURGERY    . FRACTURE SURGERY    . KNEE SURGERY       OB History   No obstetric history on file.      Home Medications    Prior to Admission medications   Medication Sig Start Date End Date Taking? Authorizing Provider  acetaminophen (TYLENOL) 325 MG tablet Take 650 mg by mouth every 6 (six) hours as needed for mild pain or headache.    [provider]  atorvastatin  (LIPITOR) 40 MG tablet Take 1 tablet (40 mg total) by mouth daily at 6 PM. Patient taking differently: Take 40 mg by mouth at bedtime.  10/19/16   Philip Aspen, Limmie Patricia, MD  cephALEXin (KEFLEX) 500 MG capsule Take 1 capsule (500 mg total) by mouth 4 (four) times daily for 7 days. 02/15/19 02/22/19  Maxwell Caul, PA-C  diclofenac (VOLTAREN) 75 MG EC tablet Take 1 tablet (75 mg total) by mouth 2 (two) times daily with a meal. Patient not taking: Reported on 06/15/2018 04/09/18   Ranelle Oyster, MD  diclofenac sodium (VOLTAREN) 1 % GEL Apply 1 application topically 3 (three) times daily. To left knee 03/10/18   Ranelle Oyster, MD  fexofenadine (ALLEGRA) 180 MG tablet Take 1 tablet (180 mg total) by mouth daily. 03/11/18   Icard, Rachel Bo, DO  fluticasone (FLONASE) 50 MCG/ACT nasal spray Place 2 sprays into both nostrils daily.    [provider]  fluticasone furoate-vilanterol (BREO ELLIPTA) 200-25 MCG/INH AEPB Inhale 1 puff into the lungs daily. Patient taking differently: Inhale 1 puff into the lungs daily as needed (asthma symptoms).  03/11/18   Icard, Rachel Bo, DO  furosemide (LASIX) 40 MG tablet Take 1 tablet (40 mg total) by mouth daily. 03/23/18   Rollene Rotunda, MD  HYDROcodone-acetaminophen (  NORCO/VICODIN) 5-325 MG tablet Take 1-2 tablets by mouth every 6 (six) hours as needed. 02/15/19   Volanda Napoleon, PA-C  ibuprofen (ADVIL,MOTRIN) 800 MG tablet Take 800 mg by mouth every 8 (eight) hours as needed for headache or mild pain.    [provider]  Multiple Vitamin (MULTIVITAMIN WITH MINERALS) TABS tablet Take 1 tablet by mouth daily.    [provider]  omeprazole (PRILOSEC) 20 MG capsule Take 1 capsule (20 mg total) by mouth daily. 03/11/18   Icard, Octavio Graves, DO  potassium chloride SA (K-DUR,KLOR-CON) 20 MEQ tablet Take 1 tablet (20 mEq total) by mouth daily. 03/23/18   Minus Breeding, MD  ranitidine (ZANTAC) 150 MG tablet Take 1 tablet (150 mg total) by  mouth at bedtime. 03/11/18   Icard, Octavio Graves, DO  sertraline (ZOLOFT) 100 MG tablet Take 100 mg by mouth at bedtime.     [provider]  tiZANidine (ZANAFLEX) 4 MG tablet Take 4 mg by mouth at bedtime as needed for muscle spasms.    [provider]  traMADol (ULTRAM) 50 MG tablet Take 1 tablet (50 mg total) by mouth every 12 (twelve) hours as needed. 06/08/18   Meredith Staggers, MD    Family History Family History  Problem Relation Age of Onset  . Hypertension Mother   . Kidney failure Mother   . Heart disease Mother        Onset in her 20s    Social History Social History   Tobacco Use  . Smoking status: Former Smoker    Packs/day: 0.25    Years: 25.00    Pack years: 6.25    Types: Cigarettes    Quit date: 01/01/2018    Years since quitting: 1.1  . Smokeless tobacco: Never Used  Substance Use Topics  . Alcohol use: No  . Drug use: No     Allergies   Hydrocodone, Percocet [oxycodone-acetaminophen], and Vicodin [hydrocodone-acetaminophen]   Review of Systems Review of Systems  Skin: Positive for wound.  Neurological: Negative for weakness and numbness.  All other systems reviewed and are negative.    Physical Exam Updated Vital Signs BP (!) 150/77 (BP Location: Right Arm)   Pulse (!) 58   Temp 98.3 F (36.8 C) (Oral)   Resp 18   Ht 5\' 7"  (1.702 m)   Wt 124.3 kg   SpO2 97%   BMI 42.91 kg/m   Physical Exam Vitals signs and nursing note reviewed.  Constitutional:      Appearance: She is well-developed.  HENT:     Head: Normocephalic and atraumatic.  Eyes:     General: No scleral icterus.       Right eye: No discharge.        Left eye: No discharge.     Conjunctiva/sclera: Conjunctivae normal.  Cardiovascular:     Pulses:          Dorsalis pedis pulses are 2+ on the right side and 2+ on the left side.  Pulmonary:     Effort: Pulmonary effort is normal.  Skin:    General: Skin is warm and dry.     Capillary Refill: Capillary refill  takes less than 2 seconds.     Comments: Lacerations noted to the plantar surface of the second, third, fourth toe overlying the DIP joint line. Good distal cap refill. LLE is not dusky in appearance or cool to touch.  Neurological:     Mental Status: She is alert.  Comments: Sensation intact along major nerve distributions of BLE  Psychiatric:        Speech: Speech normal.        Behavior: Behavior normal.      ED Treatments / Results  Labs (all labs ordered are listed, but only abnormal results are displayed) Labs Reviewed - No data to display  EKG None  Radiology Dg Foot Complete Left  Result Date: 02/15/2019 CLINICAL DATA:  Recent fall with left pain, initial encounter EXAM: LEFT FOOT - COMPLETE 3+ VIEW COMPARISON:  None. FINDINGS: There is no evidence of fracture or dislocation. No soft tissue abnormality is seen. Mild tarsal degenerative changes are noted. IMPRESSION: No acute abnormality noted. Electronically Signed   By: Alcide CleverMark  Lukens M.D.   On: 02/15/2019 00:35    Procedures .Marland Kitchen.Laceration Repair  Date/Time: 02/15/2019 1:55 AM Performed by: Maxwell CaulLayden, Jaice Digioia A, PA-C Authorized by: Maxwell CaulLayden, Ji Fairburn A, PA-C   Consent:    Consent obtained:  Verbal   Consent given by:  Patient   Risks discussed:  Infection, need for additional repair, pain, poor cosmetic result and poor wound healing   Alternatives discussed:  No treatment and delayed treatment Universal protocol:    Procedure explained and questions answered to patient or proxy's satisfaction: yes     Relevant documents present and verified: yes     Test results available and properly labeled: yes     Imaging studies available: yes     Required blood products, implants, devices, and special equipment available: yes     Site/side marked: yes     Immediately prior to procedure, a time out was called: yes     Patient identity confirmed:  Verbally with patient Anesthesia (see MAR for exact dosages):    Anesthesia method:   Nerve block   Block needle gauge:  25 G   Block anesthetic:  Lidocaine 1% w/o epi   Block injection procedure:  Anatomic landmarks identified, introduced needle and incremental injection Laceration details:    Location:  Toe   Toe location:  L second toe   Length (cm):  2 Repair type:    Repair type:  Intermediate Pre-procedure details:    Preparation:  Patient was prepped and draped in usual sterile fashion Exploration:    Hemostasis achieved with:  Direct pressure   Wound exploration: wound explored through full range of motion     Wound extent: no foreign bodies/material noted   Treatment:    Area cleansed with:  Betadine   Amount of cleaning:  Extensive   Irrigation solution:  Sterile saline   Irrigation method:  Syringe   Visualized foreign bodies/material removed: no   Skin repair:    Repair method:  Sutures   Suture size:  4-0   Suture material:  Prolene   Suture technique:  Simple interrupted   Number of sutures:  6 Approximation:    Approximation:  Close Post-procedure details:    Dressing:  Non-adherent dressing   Patient tolerance of procedure:  Tolerated well, no immediate complications .Marland Kitchen.Laceration Repair  Date/Time: 02/15/2019 1:56 AM Performed by: Maxwell CaulLayden, Tell Rozelle A, PA-C Authorized by: Maxwell CaulLayden, Roxene Alviar A, PA-C   Consent:    Consent obtained:  Verbal   Consent given by:  Patient   Risks discussed:  Infection, need for additional repair, pain, poor cosmetic result and poor wound healing   Alternatives discussed:  No treatment and delayed treatment Universal protocol:    Procedure explained and questions answered to patient or proxy's satisfaction: yes  Relevant documents present and verified: yes     Test results available and properly labeled: yes     Imaging studies available: yes     Required blood products, implants, devices, and special equipment available: yes     Site/side marked: yes     Immediately prior to procedure, a time out was called:  yes     Patient identity confirmed:  Verbally with patient Anesthesia (see MAR for exact dosages):    Anesthesia method:  Nerve block   Block needle gauge:  25 G   Block anesthetic:  Lidocaine 1% w/o epi   Block injection procedure:  Anatomic landmarks identified, introduced needle, incremental injection and negative aspiration for blood   Block outcome:  Anesthesia achieved Laceration details:    Location:  Toe   Toe location:  L third toe   Length (cm):  2 Repair type:    Repair type:  Intermediate Pre-procedure details:    Preparation:  Patient was prepped and draped in usual sterile fashion Exploration:    Hemostasis achieved with:  Direct pressure   Wound exploration: wound explored through full range of motion     Wound extent: no foreign bodies/material noted   Treatment:    Area cleansed with:  Betadine   Amount of cleaning:  Extensive   Irrigation solution:  Sterile saline   Irrigation method:  Syringe   Visualized foreign bodies/material removed: no   Skin repair:    Repair method:  Sutures and tissue adhesive   Suture size:  5-0   Suture material:  Prolene   Suture technique:  Simple interrupted   Number of sutures:  1 Approximation:    Approximation:  Close Post-procedure details:    Dressing:  Non-adherent dressing   Patient tolerance of procedure:  Tolerated well, no immediate complications Comments:     Once the wound was anesthetized, was thoroughly extensively irrigated with sterile saline.  One midline suture was able to be placed but due to the positioning of her toe, I difficulty placing the rest of the sutures.  I was unable to get any other sutures in place.  Ultimately, I discussed with patient the decision was made to apply Dermabond over the laceration to help with closing. Marland Kitchen..Laceration Repair  Date/Time: 02/15/2019 1:57 AM Performed by: Maxwell CaulLayden, Shterna Laramee A, PA-C Authorized by: Maxwell CaulLayden, Andreal Vultaggio A, PA-C   Consent:    Consent obtained:  Verbal    Consent given by:  Patient   Risks discussed:  Infection, need for additional repair, pain, poor cosmetic result and poor wound healing   Alternatives discussed:  No treatment and delayed treatment Universal protocol:    Procedure explained and questions answered to patient or proxy's satisfaction: yes     Relevant documents present and verified: yes     Test results available and properly labeled: yes     Imaging studies available: yes     Required blood products, implants, devices, and special equipment available: yes     Site/side marked: yes     Immediately prior to procedure, a time out was called: yes     Patient identity confirmed:  Verbally with patient Anesthesia (see MAR for exact dosages):    Anesthesia method:  Nerve block   Block needle gauge:  25 G   Block anesthetic:  Lidocaine 1% w/o epi   Block injection procedure:  Anatomic landmarks identified, introduced needle and incremental injection   Block outcome:  Anesthesia achieved Laceration details:    Location:  Toe   Toe location:  L fourth toe   Length (cm):  1.5 Repair type:    Repair type:  Intermediate Pre-procedure details:    Preparation:  Patient was prepped and draped in usual sterile fashion Exploration:    Hemostasis achieved with:  Direct pressure   Contaminated: no   Treatment:    Area cleansed with:  Betadine   Amount of cleaning:  Extensive   Irrigation solution:  Sterile saline   Irrigation method:  Syringe   Visualized foreign bodies/material removed: no   Skin repair:    Repair method:  Sutures   Suture size:  5-0   Suture material:  Prolene   Number of sutures:  1 Approximation:    Approximation:  Close Post-procedure details:    Dressing:  Non-adherent dressing   Patient tolerance of procedure:  Tolerated well, no immediate complications Comments:     Once the wound was anesthetized, was thoroughly extensively irrigated with sterile saline.  One midline suture was able to be placed but  due to the positioning of her toe, I difficulty placing the rest of the sutures.  I was unable to get any other sutures in place.  Ultimately, I discussed with patient the decision was made to apply Dermabond over the laceration to help with closing.   (including critical care time)  Medications Ordered in ED Medications  Tdap (BOOSTRIX) injection 0.5 mL (0.5 mLs Intramuscular Given 02/14/19 2259)  lidocaine (PF) (XYLOCAINE) 1 % injection 10 mL (10 mLs Intradermal Given 02/14/19 2259)  acetaminophen (TYLENOL) tablet 1,000 mg (1,000 mg Oral Given 02/14/19 2259)  HYDROcodone-acetaminophen (NORCO/VICODIN) 5-325 MG per tablet 1 tablet (1 tablet Oral Given 02/15/19 0110)  diphenhydrAMINE (BENADRYL) capsule 25 mg (25 mg Oral Given 02/15/19 0110)     Initial Impression / Assessment and Plan / ED Course  I have reviewed the triage vital signs and the nursing notes.  Pertinent labs & imaging results that were available during my care of the patient were reviewed by me and considered in my medical decision making (see chart for details).        48 year old female who presents for evaluation of lacerations to left second, third, fourth toe.  She was standing on a bed trying to hang curtains and slipped and landed on a metal bed frame causing lacerations noted to the DIP joints of the left second, third, fourth toe.  No head injury, LOC.  Does not know when last tetanus was. Patient is afebrile, non-toxic appearing, sitting comfortably on examination table. Vital signs reviewed and stable. Patient is neurovascularly intact.  On exam, she has lacerations noted to the DIPs of the left second, third, fourth.  Plan for wound care, tetanus update.  X-ray reviewed.  Negative for any acute bony abnormality.    Lacerations repaired as documented above.  There was great difficulty repairing the third and fourth toes given the position of her toes.  She does have slightly contracted toes given pre-existing injury  that she had to the foot.  We tried multiple attempts and could not get stitches placed accurately.  I discussed extensively with patient and we agreed to do Dermabond overlying.  Will give antibiotics and prolonged exposure as well as repair. Patient with no known drug allergies.  We will also give short course of pain medication.  She has Vicodin, Percocet listed as allergies but she states that she can take them as she takes Benadryl.  Patient given outpatient follow-up with podiatry. At this  time, patient exhibits no emergent life-threatening condition that require further evaluation in ED or admission. Patient had ample opportunity for questions and discussion. All patient's questions were answered with full understanding. Strict return precautions discussed. Patient expresses understanding and agreement to plan.   Portions of this note were generated with Scientist, clinical (histocompatibility and immunogenetics). Dictation errors may occur despite best attempts at proofreading.   Final Clinical Impressions(s) / ED Diagnoses   Final diagnoses:  Laceration of toe of left foot without foreign body present or damage to nail, unspecified toe, initial encounter    ED Discharge Orders         Ordered    cephALEXin (KEFLEX) 500 MG capsule  4 times daily     02/15/19 0130    HYDROcodone-acetaminophen (NORCO/VICODIN) 5-325 MG tablet  Every 6 hours PRN     02/15/19 0130           Maxwell Caul, PA-C 02/15/19 Phoebe Perch, MD 02/15/19 2336

## 2019-02-14 NOTE — ED Notes (Signed)
Pt foot placed in warm saline/ water mixture to soak.

## 2019-02-14 NOTE — ED Triage Notes (Signed)
Pt reports that she was hanging curtains and slipped. She presents with a laceration on the dorsal aspect of her last 4 toes. Bleeding controlled. Ambulatory.

## 2019-02-15 ENCOUNTER — Emergency Department (HOSPITAL_COMMUNITY): Payer: Managed Care, Other (non HMO)

## 2019-02-15 MED ORDER — HYDROCODONE-ACETAMINOPHEN 5-325 MG PO TABS: 1.0000 | ORAL_TABLET | Freq: Four times a day (QID) | ORAL | 0 refills | Status: DC | PRN

## 2019-02-15 MED ORDER — CEPHALEXIN 500 MG PO CAPS: 500.0000 mg | ORAL_CAPSULE | Freq: Four times a day (QID) | ORAL | 0 refills | Status: AC

## 2019-02-15 MED ORDER — HYDROCODONE-ACETAMINOPHEN 5-325 MG PO TABS
1.0000 | ORAL_TABLET | Freq: Once | ORAL | Status: AC
  Administered 2019-02-15: 1 via ORAL
  Filled 2019-02-15: qty 1

## 2019-02-15 MED ORDER — DIPHENHYDRAMINE HCL 25 MG PO CAPS
25.0000 mg | ORAL_CAPSULE | Freq: Once | ORAL | Status: AC
  Administered 2019-02-15: 25 mg via ORAL
  Filled 2019-02-15: qty 1

## 2019-02-15 NOTE — ED Notes (Signed)
Pt's laceration was cleaned and dressed. Pt was provided with a post op shoe and crutches.

## 2019-02-15 NOTE — ED Notes (Signed)
Pt verbalizes did not receive prescription for keflex; this Probation officer called in prescription for pt by Roslynn Amble MD to [facility] as listed on discharge at 1345 02/15/19.

## 2019-02-15 NOTE — Discharge Instructions (Signed)
Keep the wound clean and dry for the first 24 hours. After that you may gently clean the wound with soap and water. Make sure to pat dry the wound before covering it with any dressing. You can use topical antibiotic ointment and bandage. Ice and elevate for pain relief.   You can take Tylenol or Ibuprofen as directed for pain. You can alternate Tylenol and Ibuprofen every 4 hours for additional pain relief.   Take pain medications as directed for break through pain. Do not drive or operate machinery while taking this medication.   Wear the postop shoe for support and stabilization.  Use the crutches so that you are not putting any weight on your toes.  Take antibiotics as directed. Please take all of your antibiotics until finished.  Return to the Emergency Department, your primary care doctor, or the [facility] [place] in 7 days for suture removal.   Monitor closely for any signs of infection. Return to the Emergency Department for any worsening redness/swelling of the area that begins to spread, drainage from the site, worsening pain, fever or any other worsening or concerning symptoms.

## 2019-02-22 ENCOUNTER — Other Ambulatory Visit: Payer: Self-pay

## 2019-02-22 ENCOUNTER — Ambulatory Visit: Payer: Managed Care, Other (non HMO) | Admitting: Podiatry

## 2019-02-22 ENCOUNTER — Encounter: Payer: Self-pay | Admitting: Podiatry

## 2019-02-22 DIAGNOSIS — S91114S Laceration without foreign body of right lesser toe(s) without damage to nail, sequela: Secondary | ICD-10-CM

## 2019-02-22 MED ORDER — DOXYCYCLINE HYCLATE 100 MG PO TABS: 100.0000 mg | ORAL_TABLET | Freq: Two times a day (BID) | ORAL | 0 refills | Status: DC

## 2019-02-22 MED ORDER — GENTAMICIN SULFATE 0.1 % EX CREA: 1.0000 "application " | TOPICAL_CREAM | Freq: Two times a day (BID) | CUTANEOUS | 1 refills | Status: DC

## 2019-02-22 MED ORDER — HYDROCODONE-ACETAMINOPHEN 5-325 MG PO TABS: 1.0000 | ORAL_TABLET | Freq: Four times a day (QID) | ORAL | 0 refills | Status: DC | PRN

## 2019-02-24 ENCOUNTER — Telehealth: Payer: Self-pay | Admitting: *Deleted

## 2019-02-24 NOTE — Telephone Encounter (Signed)
Pt states the pain medication is not helping.

## 2019-02-24 NOTE — Telephone Encounter (Signed)
I asked pt how she was taking the hydrocodone and she stated one before the pain kicks in. I told pt Dr. Amalia Hailey instructions stated she could take 2 tablets every 6 hours and that she could take ibuprofen 800mg  3 times a day in between the doses of the pain medication. Pt states understanding.

## 2019-02-27 NOTE — Progress Notes (Signed)
   HPI: 48 y.o. female presenting today as a new patient with a chief complaint of a laceration of toes 2, 3, 4 of the left foot that occurred 8 days ago. She states she was seen in the ED where she received sutures. She states she sustained the injury from slipping off a bed she was standing on and landing on a metal bed frame. Applying pressure to the area increases the pain. She was prescribed Keflex and Vicodin from the ED. Patient is here for further evaluation and treatment.   Past Medical History:  Diagnosis Date  . Arthritis   . Back pain   . Bronchitis   . CHF (congestive heart failure) ([place])   . Gout   . Pneumonia      Physical Exam: General: The patient is alert and oriented x3 in no acute distress.  Dermatology: Lacerations noted to the plantar sulcus of digits 2, 3 and 4 of the left foot with sutures intact. Skin is warm, dry and supple bilateral lower extremities.  Vascular: Palpable pedal pulses bilaterally. No edema or erythema noted. Capillary refill within normal limits.  Neurological: Epicritic and protective threshold grossly intact bilaterally.   Musculoskeletal Exam: Range of motion within normal limits to all pedal and ankle joints bilateral. Muscle strength 5/5 in all groups bilateral.   Assessment: 1. Lacerations digits 2, 3, 4 left    Plan of Care:  1. Patient evaluated. X-Rays from ED reviewed.  2. Dressing applied.  3. Note for work applied.  4. Prescription for Gentamicin cream provided to patient to use daily with a bandage.  5. Prescription for Doxycycline provided to patient.  6. Post op shoe dispensed.  7. Prescription for Vicodin provided to patient.  8. Return to clinic in 10 days.   Truck driver, long haul.       Edrick Kins, DPM [facility]  Dr. Edrick Kins, DPM    [address], [place] [address]                Office [phone]  Fax [phone]

## 2019-03-03 ENCOUNTER — Encounter: Payer: Self-pay | Admitting: Podiatry

## 2019-03-03 ENCOUNTER — Other Ambulatory Visit: Payer: Self-pay

## 2019-03-03 ENCOUNTER — Ambulatory Visit: Payer: Managed Care, Other (non HMO) | Admitting: Podiatry

## 2019-03-03 DIAGNOSIS — M79676 Pain in unspecified toe(s): Secondary | ICD-10-CM

## 2019-03-03 DIAGNOSIS — S91114S Laceration without foreign body of right lesser toe(s) without damage to nail, sequela: Secondary | ICD-10-CM

## 2019-03-03 MED ORDER — OXYCODONE-ACETAMINOPHEN 5-325 MG PO TABS: 1.0000 | ORAL_TABLET | Freq: Four times a day (QID) | ORAL | 0 refills | Status: DC | PRN

## 2019-03-04 NOTE — Progress Notes (Signed)
   HPI: 48 y.o. female presenting today for follow up evaluation of lacerations of toes 2-4 of the left foot. She states the wounds are healing well. She reports some continued pain and states the Vicodin is not helping to alleviate it. She denies any drainage or any other complaints or concerns. Patient is here for further evaluation and treatment.   Past Medical History:  Diagnosis Date  . Arthritis   . Back pain   . Bronchitis   . CHF (congestive heart failure) ([place])   . Gout   . Pneumonia      Physical Exam: General: The patient is alert and oriented x3 in no acute distress.  Dermatology: Skin lacerations noted to the plantar sulcus of digits 2, 3 and 4 of the left foot have healed. Skin is warm, dry and supple bilateral lower extremities.  Vascular: Palpable pedal pulses bilaterally. No edema or erythema noted. Capillary refill within normal limits.  Neurological: Epicritic and protective threshold grossly intact bilaterally.   Musculoskeletal Exam: Range of motion within normal limits to all pedal and ankle joints bilateral. Muscle strength 5/5 in all groups bilateral.   Assessment: 1. Lacerations digits 2, 3, 4 left - healed    Plan of Care:  1. Patient evaluated.  2. Sutures removed.  3. May resume full activity with no restrictions.  4. Discontinue using post op shoe.  5. Return to clinic as needed.   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       Edrick Kins, DPM [facility]  Dr. Edrick Kins, DPM    [address], [place] [address]                Office [phone]  Fax [phone]

## 2019-03-30 ENCOUNTER — Telehealth: Payer: Self-pay | Admitting: *Deleted

## 2019-03-30 NOTE — Telephone Encounter (Signed)
Left message informing pt that for her to have spasms and pain in the foot this far from her last appt and suture removal, she may be having a different problem that needed to be evaluated, call for an appt.

## 2019-03-30 NOTE — Telephone Encounter (Signed)
Pt called states she is having spasms, burning and pain in the foot that the sutures were removed.

## 2019-03-31 ENCOUNTER — Encounter: Payer: Managed Care, Other (non HMO) | Admitting: Physical Medicine & Rehabilitation

## 2019-04-12 ENCOUNTER — Other Ambulatory Visit: Payer: Self-pay

## 2019-04-12 ENCOUNTER — Ambulatory Visit: Payer: Managed Care, Other (non HMO) | Admitting: Podiatry

## 2019-04-12 DIAGNOSIS — R252 Cramp and spasm: Secondary | ICD-10-CM

## 2019-04-12 DIAGNOSIS — G5792 Unspecified mononeuropathy of left lower limb: Secondary | ICD-10-CM | POA: Diagnosis not present

## 2019-04-12 MED ORDER — GABAPENTIN 100 MG PO CAPS: 100.0000 mg | ORAL_CAPSULE | Freq: Three times a day (TID) | ORAL | 1 refills | Status: DC

## 2019-04-12 MED ORDER — CYCLOBENZAPRINE HCL 5 MG PO TABS: 5.0000 mg | ORAL_TABLET | Freq: Three times a day (TID) | ORAL | 1 refills | Status: DC | PRN

## 2019-04-15 NOTE — Progress Notes (Signed)
   HPI: 48 y.o. female presenting today with a chief complaint of left midfoot pain that began a few weeks ago. She states the pain radiates to the medial aspect of the foot. She reports associated cramping spasms and burning. She states the pain is worse when she is driving but also occur sometimes at rest. She has not done anything for treatment. Patient is here for further evaluation and treatment.   Past Medical History:  Diagnosis Date  . Arthritis   . Back pain   . Bronchitis   . CHF (congestive heart failure) ([place])   . Gout   . Pneumonia      Physical Exam: General: The patient is alert and oriented x3 in no acute distress.  Dermatology: Skin is warm, dry and supple bilateral lower extremities. Negative for open lesions or macerations.  Vascular: Palpable pedal pulses bilaterally. No edema or erythema noted. Capillary refill within normal limits.  Neurological: Epicritic and protective threshold intact.  Paresthesia with burning, shooting pain noted to digits 2-4 left.   Musculoskeletal Exam: Range of motion within normal limits to all pedal and ankle joints bilateral. Muscle strength 5/5 in all groups bilateral.    Assessment: 1. Neuritis digits 2, 3, 4 left  2. Intermittent cramping left foot    Plan of Care:  1. Patient evaluated. 2. Prescription for Gabapentin 100 mg TID provided to patient.  3. Prescription for Flexeril 5 mg provided to patient.  4. Return to clinic as needed.   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       Edrick Kins, DPM [facility]  Dr. Edrick Kins, DPM    [address], [place] [address]                Office [phone]  Fax [phone]

## 2019-05-12 ENCOUNTER — Other Ambulatory Visit: Payer: Self-pay

## 2019-05-12 MED ORDER — GABAPENTIN 100 MG PO CAPS: 100.0000 mg | ORAL_CAPSULE | Freq: Three times a day (TID) | ORAL | 1 refills | Status: DC

## 2019-05-12 NOTE — Progress Notes (Signed)
Refill of Gabapentin 100mg  sent to pharmacy [facility] [address]

## 2019-05-30 ENCOUNTER — Other Ambulatory Visit: Payer: Self-pay | Admitting: Cardiology

## 2019-06-07 ENCOUNTER — Ambulatory Visit: Payer: Managed Care, Other (non HMO) | Attending: Internal Medicine

## 2019-06-07 DIAGNOSIS — Z20822 Contact with and (suspected) exposure to covid-19: Secondary | ICD-10-CM

## 2019-06-08 LAB — NOVEL CORONAVIRUS, NAA: SARS-CoV-2, NAA: DETECTED — AB

## 2019-07-06 ENCOUNTER — Telehealth: Payer: Self-pay | Admitting: Podiatry

## 2019-07-06 MED ORDER — CYCLOBENZAPRINE HCL 5 MG PO TABS: 5.0000 mg | ORAL_TABLET | Freq: Three times a day (TID) | ORAL | 1 refills | Status: DC | PRN

## 2019-07-06 NOTE — Addendum Note (Signed)
Addended by: Geraldine Contras D on: 07/06/2019 04:19 PM   Modules accepted: Orders

## 2019-07-06 NOTE — Telephone Encounter (Signed)
Pt called and stated that she needs a refill on (FLEXERIL) 5 MG tablet

## 2019-07-06 NOTE — Telephone Encounter (Signed)
Per Dr. Logan Bores verbal order, ok to refill    Script has been sent to pharmacy

## 2019-08-16 ENCOUNTER — Ambulatory Visit: Payer: Managed Care, Other (non HMO) | Attending: Internal Medicine

## 2019-08-16 DIAGNOSIS — Z23 Encounter for immunization: Secondary | ICD-10-CM

## 2019-08-16 NOTE — Progress Notes (Signed)
   Covid-19 Vaccination Clinic  Name:  DESTENI PISCOPO    MRN: [id number] DOB: Nov 02, 1970  08/16/2019  Ms. Delgreco was observed post Covid-19 immunization for 15 minutes without incident. She was provided with Vaccine Information Sheet and instruction to access the V-Safe system.   Ms. Forinash was instructed to call 911 with any severe reactions post vaccine: Marland Kitchen Difficulty breathing  . Swelling of face and throat  . A fast heartbeat  . A bad rash all over body  . Dizziness and weakness   Immunizations Administered    Name Date Dose VIS Date Route   Pfizer COVID-19 Vaccine 08/16/2019  1:58 PM 0.3 mL 05/14/2019 Intramuscular   Manufacturer: [employer], [employer]   Lot: [id number]   NDC: [address]-5

## 2019-09-07 ENCOUNTER — Ambulatory Visit: Payer: Managed Care, Other (non HMO) | Attending: Internal Medicine

## 2019-09-07 ENCOUNTER — Ambulatory Visit: Payer: Managed Care, Other (non HMO)

## 2019-09-07 DIAGNOSIS — Z23 Encounter for immunization: Secondary | ICD-10-CM

## 2019-09-07 NOTE — Progress Notes (Signed)
   Covid-19 Vaccination Clinic  Name:  Kelsey Morgan    MRN: [id number] DOB: [birth year]  09/07/2019  Kelsey Morgan was observed post Covid-19 immunization for 15 minutes without incident. She was provided with Vaccine Information Sheet and instruction to access the V-Safe system.   Kelsey Morgan was instructed to call 911 with any severe reactions post vaccine: Marland Kitchen Difficulty breathing  . Swelling of face and throat  . A fast heartbeat  . A bad rash all over body  . Dizziness and weakness   Immunizations Administered    Name Date Dose VIS Date Route   Pfizer COVID-19 Vaccine 09/07/2019 10:15 AM 0.3 mL 05/14/2019 Intramuscular   Manufacturer: [employer], [employer]   Lot: [id number]   NDC: [address]-2

## 2019-09-08 ENCOUNTER — Telehealth: Payer: Self-pay | Admitting: Podiatry

## 2019-09-08 NOTE — Telephone Encounter (Signed)
Patient would like to have medication refilled, Gabapentin and Flexeril. 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 Patient would like it sent to [facility] on [address].

## 2019-09-21 ENCOUNTER — Telehealth: Payer: Self-pay | Admitting: Podiatry

## 2019-09-21 NOTE — Telephone Encounter (Signed)
Pt called wanting a refill on her medications she stated that the pharmacy sent over a request for the refillplease advise

## 2019-09-21 NOTE — Telephone Encounter (Signed)
Left message informing pt I had sent the refill request for the two medications she had requested to DR. Evans on 09/08/2019, but reminded pt she could not take either of these medications and drive, and if she was still having problems she wold benefit from being seen again in office.

## 2019-10-18 ENCOUNTER — Ambulatory Visit: Payer: Managed Care, Other (non HMO) | Admitting: Podiatry

## 2019-10-26 ENCOUNTER — Other Ambulatory Visit: Payer: Self-pay

## 2019-10-26 MED ORDER — GABAPENTIN 100 MG PO CAPS: 100.0000 mg | ORAL_CAPSULE | Freq: Three times a day (TID) | ORAL | 1 refills | Status: DC

## 2019-10-26 MED ORDER — CYCLOBENZAPRINE HCL 5 MG PO TABS: 5.0000 mg | ORAL_TABLET | Freq: Three times a day (TID) | ORAL | 1 refills | Status: DC | PRN

## 2020-01-06 NOTE — Progress Notes (Deleted)
Cardiology Office Note   Date:  01/06/2020   ID:  Kelsey Morgan, DOB [birth year], MRN [id number]  PCP:  Deatra James, MD  Cardiologist:  Dr. Antoine Poche  No chief complaint on file.    History of Present Illness: Kelsey Morgan is a 49 y.o. female who presents for ongoing assessment and management of chest discomfort.  Most recently seen by Dr. Antoine Poche on 03/23/2018.  The symptoms were found to be very atypical for angina.  Echocardiogram on 10/18/2016 revealed normal EF of 55 to 60% with grade 1 diastolic dysfunction.  She underwent a stress Myoview on 10/31/2016 which showed EF of 64% with no reversible ischemia overall consideration low risk study.  On last visit with Dr. Antoine Poche in October 2019 she has stopped smoking and was being treated for lung disease.  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  She again complained of some chest discomfort.  Salt and fluid reduction education was given.  She was to try to continue losing weight.  If she did lose significant amount of weight she was to cut down to half the dose of her diuretic and potassium supplement.  He did not feel as if any further cardiac evaluation was warranted at that time.    Past Medical History:  Diagnosis Date  . Arthritis   . Back pain   . Bronchitis   . CHF (congestive heart failure) (H[facility])   . Gout   . Pneumonia     Past Surgical History:  Procedure Laterality Date  . ABDOMINAL HYSTERECTOMY    . ANKLE SURGERY    . CHOLECYSTECTOMY    . EXCISION MASS NECK N/A 11/12/2017   Procedure: EXCISION POSTERIOR  NECK CYST;  Surgeon: Axel Filler, MD;  Location: [facility] OR;  Service: General;  Laterality: N/A;  . FOOT SURGERY    . FRACTURE SURGERY    . KNEE SURGERY       Current Outpatient Medications  Medication Sig Dispense Refill  . acetaminophen (TYLENOL) 325 MG tablet Take 650 mg by mouth every 6 (six) hours as needed for mild pain or  headache.    . albuterol (PROVENTIL) (2.5 MG/3ML) 0.083% nebulizer solution U 3 ML VIA NEB Q 4 H PRF WHZ OR SOB    . atorvastatin (LIPITOR) 40 MG tablet Take 1 tablet (40 mg total) by mouth daily at 6 PM. 30 tablet 2  . buPROPion (WELLBUTRIN XL) 150 MG 24 hr tablet TK 1 T PO QD IN THE MORNING    . cyclobenzaprine (FLEXERIL) 5 MG tablet Take 1 tablet (5 mg total) by mouth 3 (three) times daily as needed for muscle spasms. 60 tablet 1  . diclofenac (VOLTAREN) 75 MG EC tablet Take 1 tablet (75 mg total) by mouth 2 (two) times daily with a meal. 60 tablet 3  . diclofenac sodium (VOLTAREN) 1 % GEL Apply 1 application topically 3 (three) times daily. To left knee 3 Tube 4  . doxycycline (VIBRA-TABS) 100 MG tablet Take 1 tablet (100 mg total) by mouth 2 (two) times daily. 20 tablet 0  . fexofenadine (ALLEGRA) 180 MG tablet Take 1 tablet (180 mg total) by mouth daily. 30 tablet 5  . fluconazole (DIFLUCAN) 150 MG tablet TK 1 T PO ONCE A DAY FOR 1 DOSE    . fluticasone (FLONASE) 50 MCG/ACT nasal spray Place 2 sprays into both nostrils daily.    . fluticasone furoate-vilanterol (BREO  ELLIPTA) 200-25 MCG/INH AEPB Inhale 1 puff into the lungs daily. (Patient taking differently: Inhale 1 puff into the lungs daily as needed (asthma symptoms). ) 28 each 5  . furosemide (LASIX) 40 MG tablet TAKE 1 TABLET(40 MG) BY MOUTH DAILY 90 tablet 0  . gabapentin (NEURONTIN) 100 MG capsule Take 1 capsule (100 mg total) by mouth 3 (three) times daily. 60 capsule 1  . gentamicin cream (GARAMYCIN) 0.1 % Apply 1 application topically 2 (two) times daily. 15 g 1  . HYDROcodone-acetaminophen (NORCO/VICODIN) 5-325 MG tablet Take 1-2 tablets by mouth every 6 (six) hours as needed. 30 tablet 0  . ibuprofen (ADVIL,MOTRIN) 800 MG tablet Take 800 mg by mouth every 8 (eight) hours as needed for headache or mild pain.    . Multiple Vitamin (MULTIVITAMIN WITH MINERALS) TABS tablet Take 1 tablet by mouth daily.    Marland Kitchen omeprazole (PRILOSEC) 20  MG capsule Take 1 capsule (20 mg total) by mouth daily. 30 capsule 5  . oxyCODONE-acetaminophen (PERCOCET) 5-325 MG tablet Take 1 tablet by mouth every 6 (six) hours as needed for severe pain. 30 tablet 0  . Potassium Chloride ER 20 MEQ TBCR TK 1 T PO QD WF    . potassium chloride SA (K-DUR,KLOR-CON) 20 MEQ tablet Take 1 tablet (20 mEq total) by mouth daily. 90 tablet 3  . ranitidine (ZANTAC) 150 MG tablet Take 1 tablet (150 mg total) by mouth at bedtime. 30 tablet 5  . rosuvastatin (CRESTOR) 10 MG tablet TK 1 T PO QD    . sertraline (ZOLOFT) 100 MG tablet Take 100 mg by mouth at bedtime.     . simvastatin (ZOCOR) 10 MG tablet TK 1 T PO QD IN THE EVE    . tiZANidine (ZANAFLEX) 4 MG tablet Take 4 mg by mouth at bedtime as needed for muscle spasms.    . traMADol (ULTRAM) 50 MG tablet Take 1 tablet (50 mg total) by mouth every 12 (twelve) hours as needed. 60 tablet 1  . zolpidem (AMBIEN) 10 MG tablet TK 1/2 TO 1 T PO HS FOR 15 DAYS     No current facility-administered medications for this visit.    Allergies:   Hydrocodone, Percocet [oxycodone-acetaminophen], and Vicodin [hydrocodone-acetaminophen]    Social History:  The patient  reports that she quit smoking about 2 years ago. Her smoking use included cigarettes. She has a 6.25 pack-year smoking history. She has never used smokeless tobacco. She reports that she does not drink alcohol and does not use drugs.   Family History:  The patient's family history includes Heart disease in her mother; Hypertension in her mother; Kidney failure in her mother.    ROS: All other systems are reviewed and negative. Unless otherwise mentioned in H&P    PHYSICAL EXAM: VS:  There were no vitals taken for this visit. , BMI There is no height or weight on file to calculate BMI. GEN: Well nourished, well developed, in no acute distress HEENT: normal Neck: no JVD, carotid bruits, or masses Cardiac: ***RRR; no murmurs, rubs, or gallops,no edema    Respiratory:  Clear to auscultation bilaterally, normal work of breathing GI: soft, nontender, nondistended, + BS MS: no deformity or atrophy Skin: warm and dry, no rash Neuro:  Strength and sensation are intact Psych: euthymic mood, full affect   EKG:  EKG {ACTION; IS/IS [id number]} ordered today. The ekg ordered today demonstrates ***   Recent Labs: No results found for requested labs within last 8760 hours.  Lipid Panel    Component Value Date/Time   CHOL 129 11/08/2016 0947   TRIG 162 (H) 11/08/2016 0947   HDL 36 (L) 11/08/2016 0947   CHOLHDL 3.6 11/08/2016 0947   LDLCALC 61 11/08/2016 0947      Wt Readings from Last 3 Encounters:  02/14/19 274 lb (124.3 kg)  06/15/18 297 lb 9.9 oz (135 kg)  06/08/18 297 lb (134.7 kg)      Other studies Reviewed: Echocardiogram 03/02/18 Left ventricle: The cavity size was normal. Wall thickness was  normal. Systolic function was vigorous. The estimated ejection  fraction was in the range of 65% to 70%. Wall motion was normal;  there were no regional wall motion abnormalities. Doppler  parameters are consistent with abnormal left ventricular  relaxation (grade 1 diastolic dysfunction).  - Aortic valve: There was mild stenosis.  - Pulmonic valve: Peak gradient (S): 15 mm Hg.   NM Stress Test 10/31/2016    The left ventricular ejection fraction is normal (55-65%).  Nuclear stress EF: 64%.  No T wave inversion was noted during stress.  There was no ST segment deviation noted during stress.  This is a low risk study.   No reversible ischemia. LVEF 64% with normal wall motion. This is a low risk study.  ASSESSMENT AND PLAN:  1.  ***   Current medicines are reviewed at length with the patient today.  I have spent *** dedicated to the care of this patient on the date of this encounter to include pre-visit review of records, assessment, management and diagnostic testing,with shared decision  making.  Labs/ tests ordered today include: *** Bettey Mare. Liborio Nixon, ANP, AACC   01/06/2020 11:42 AM    [facility] [address] Office [phone] Fax [phone]  Notice: This dictation was prepared with Dragon dictation along with smaller phrase technology. Any transcriptional errors that result from this process are unintentional and may not be corrected upon review.

## 2020-01-07 ENCOUNTER — Ambulatory Visit: Payer: Managed Care, Other (non HMO) | Admitting: Adult Health

## 2020-06-06 IMAGING — CT CT ANGIO CHEST
2 of 7 series · 18 of 46 positions shown · IV contrast (ISOVUE)
Comparison: Chest radiograph 02/06/2018

CLINICAL DATA: Lower extremity swelling.  Sedentary.

EXAM:
CT ANGIOGRAPHY CHEST WITH CONTRAST
TECHNIQUE: Multidetector CT imaging of the chest was performed using the
standard protocol during bolus administration of intravenous
contrast. Multiplanar CT image reconstructions and MIPs were
obtained to evaluate the vascular anatomy.
CONTRAST:  100mL FJNRGO-BLE IOPAMIDOL (FJNRGO-BLE) INJECTION 76%

[Series 5: thins · axial · 0.77mm/px · z∈[+351,+594]mm · 15 of 275 slices shown]
[im 16/275  lung]
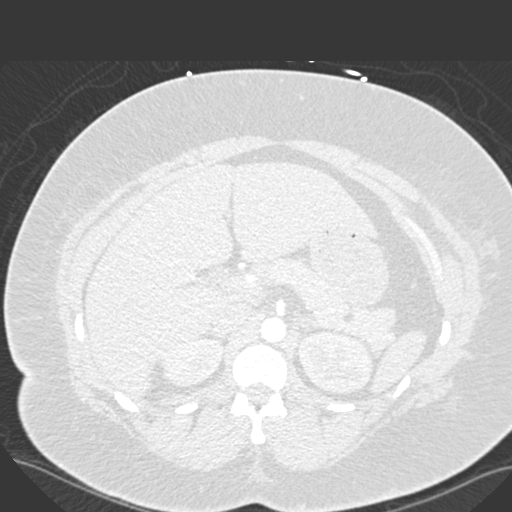
[im 31/275  soft-tissue]
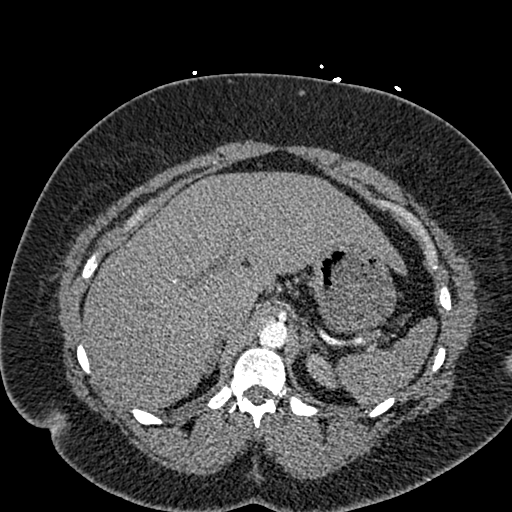
[im 46/275  lung]
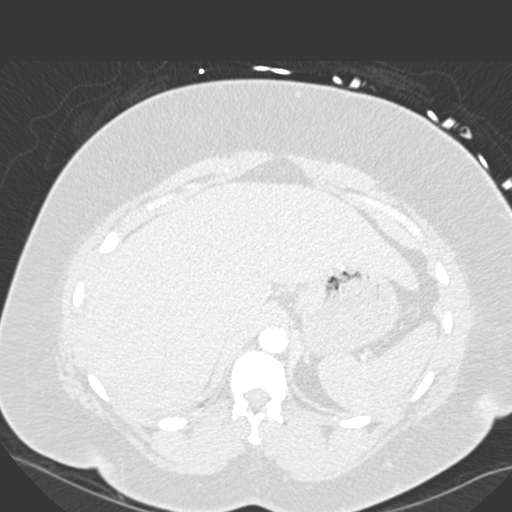
[im 61/275  soft-tissue]
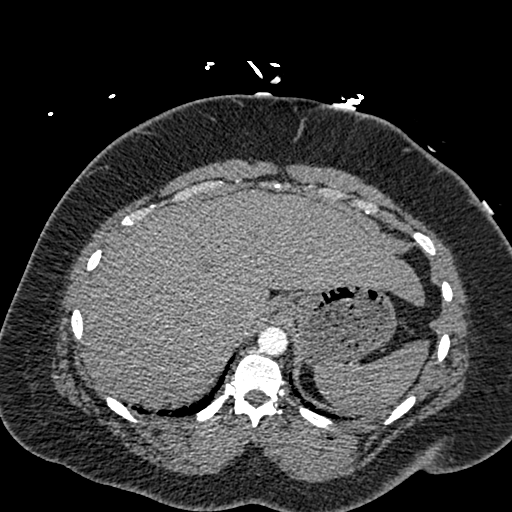
[im 92/275  lung]
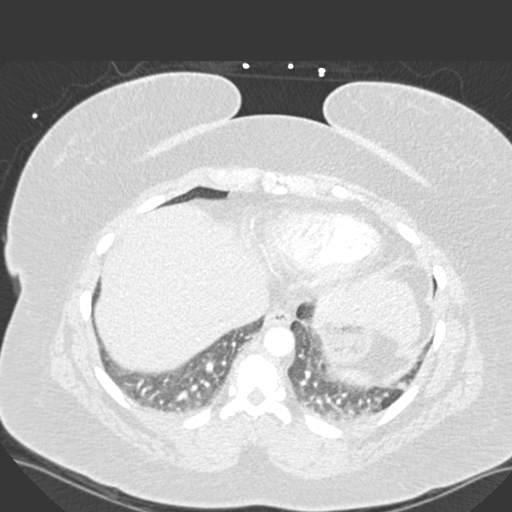
[im 107/275  soft-tissue]
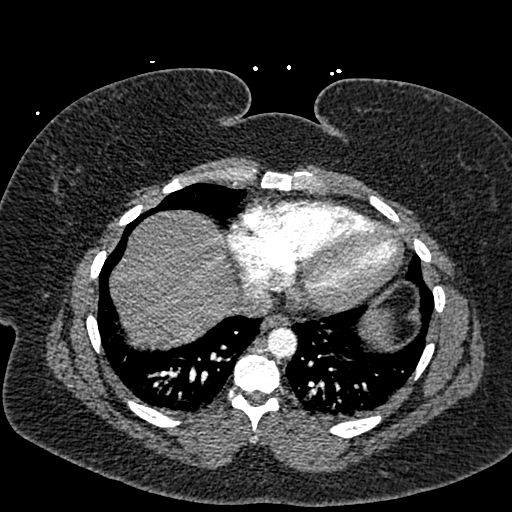
[im 122/275  lung]
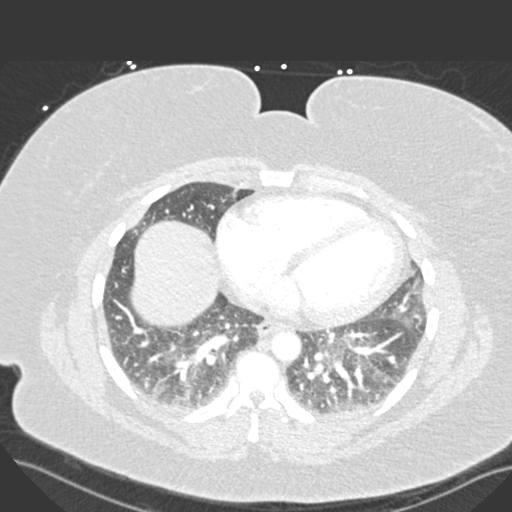
[im 138/275  soft-tissue]
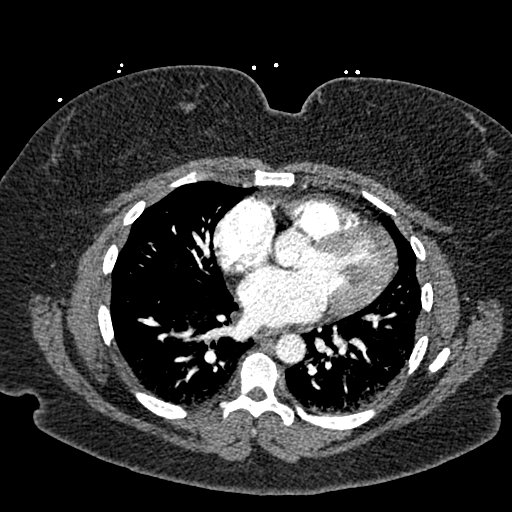
[im 153/275  lung]
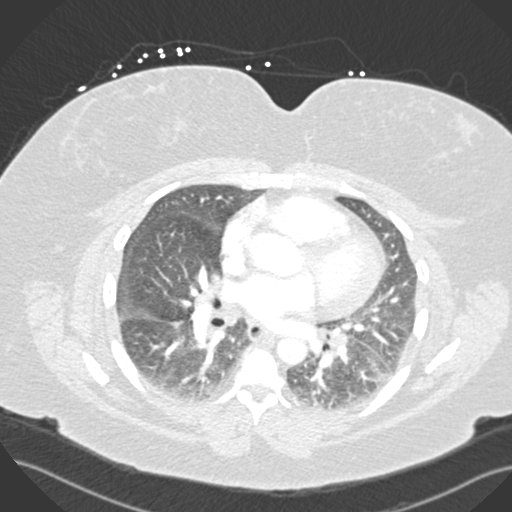
[im 168/275  soft-tissue]
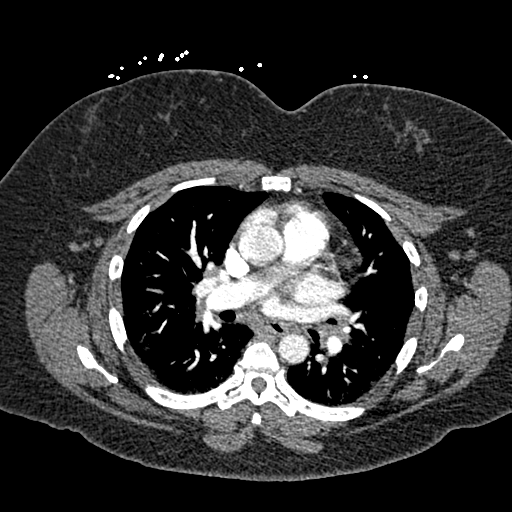
[im 183/275  lung]
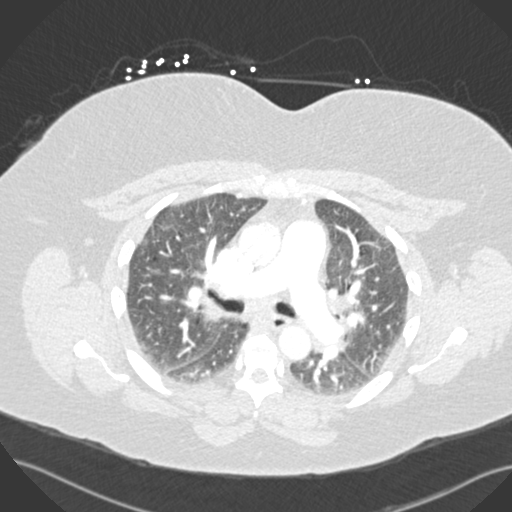
[im 214/275  soft-tissue]
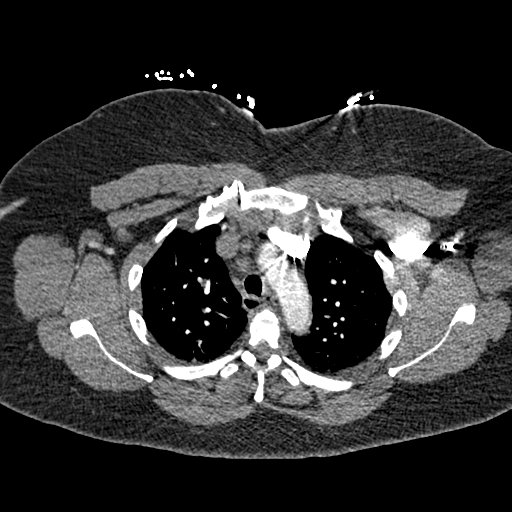
[im 229/275  lung]
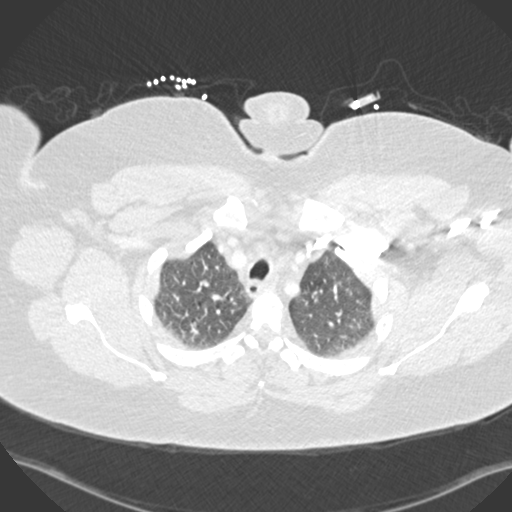
[im 244/275  soft-tissue]
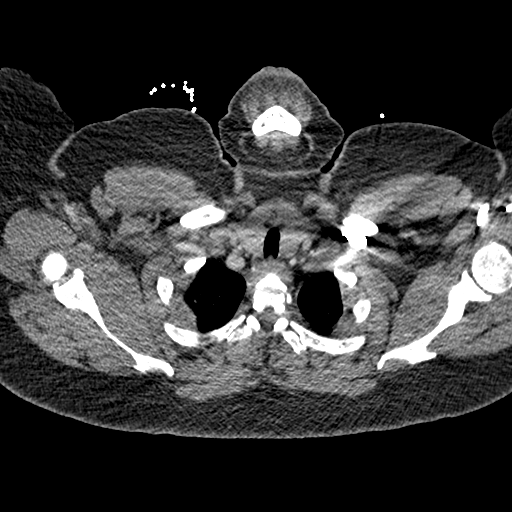
[im 259/275  lung]
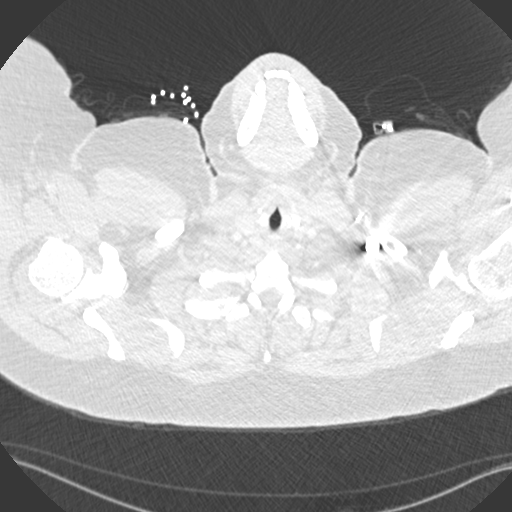

[Series 7: coronal mpr · coronal · 0.54mm/px · 3 of 170 slices shown]
[im 43/170  soft-tissue]
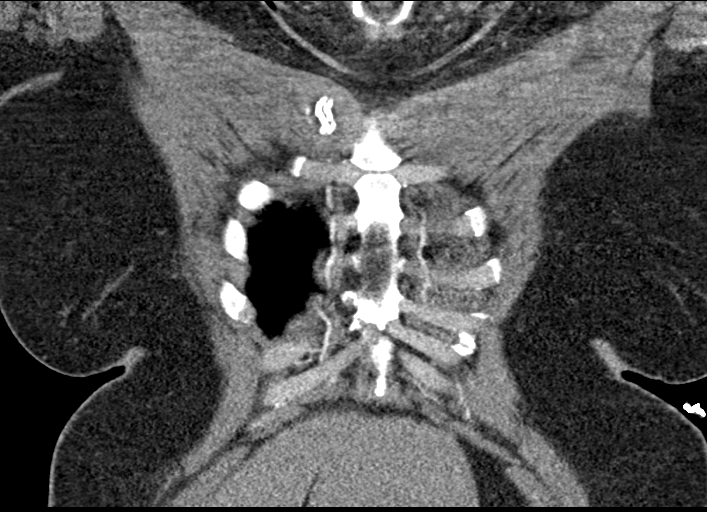
[im 85/170  soft-tissue]
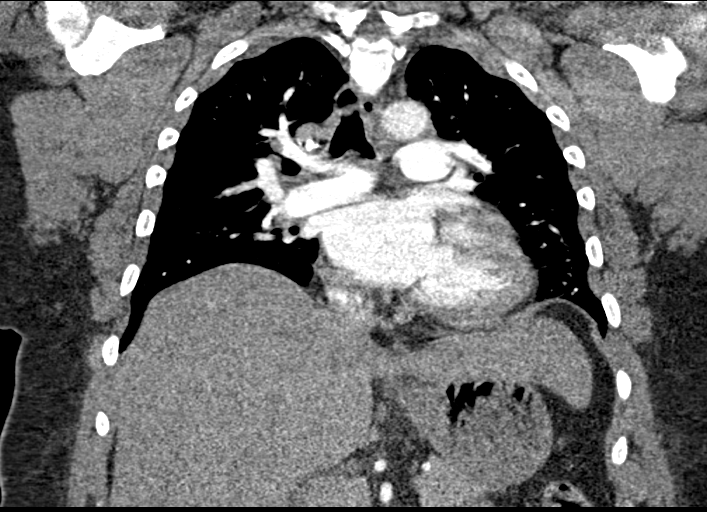
[im 127/170  soft-tissue]
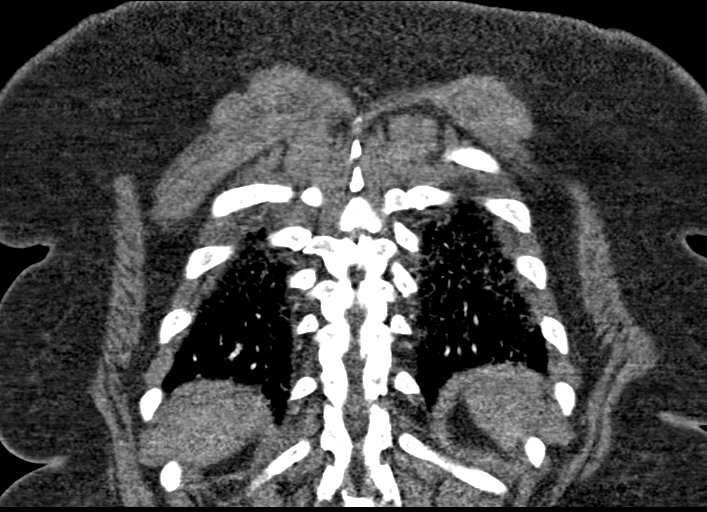

[18 of 46 positions shown; findings below may reference images not displayed]

FINDINGS: Cardiovascular: Satisfactory opacification of the pulmonary arteries
to the segmental level. No evidence of pulmonary embolism. Normal
heart size. No pericardial effusion.

Mediastinum/Nodes: Bilateral hilar and peribronchial lymph nodes.
Shotty mediastinal lymph nodes. Normal appearance of the trachea,
esophagus and thyroid gland.

Lungs/Pleura: Upper lobe predominant centrilobular emphysema. Mild
crowding of the interstitium. Small hazy right upper lobe subpleural
airspace opacities, likely inflammatory.

Upper Abdomen: Post cholecystectomy.  No acute findings.

Musculoskeletal: No chest wall abnormality. No acute or significant
osseous findings.

Review of the MIP images confirms the above findings.
IMPRESSION: No evidence of pulmonary embolus.

Bilateral hilar and peribronchial lymphadenopathy with uncertain
etiology. Associated shotty mediastinal lymph nodes.

Upper lobe predominant centrilobular emphysema.

Mild crowding of the interstitium which may be due to mild
interstitial pulmonary edema or hypoinflation.

## 2020-07-24 ENCOUNTER — Telehealth: Payer: Self-pay | Admitting: Cardiology

## 2020-07-24 NOTE — Telephone Encounter (Signed)
Pt called and would like to switch providers from [facility] to Dr. Caleen Essex number [phone]

## 2020-07-24 NOTE — Telephone Encounter (Signed)
OK with me.

## 2020-07-25 NOTE — Telephone Encounter (Signed)
Ok by me

## 2020-09-04 ENCOUNTER — Telehealth: Payer: Self-pay | Admitting: Cardiology

## 2020-09-04 NOTE — Telephone Encounter (Signed)
Spoke with Kelsey Morgan, she reports her sister recently got diagnosed with HOCM and they told her to get checked. Patient aware per echo from 2019 she does not have HOCM. She reports a piercing type pain in her left chest all the time. She reports if she touches the area and burps, the pain gets better. Reassurance given to the patient, it does not sound like heart pain. Follow up scheduled next available with dr Cristal Deer to get established as she is changes to her from dr hochrein.

## 2020-09-04 NOTE — Telephone Encounter (Signed)
Pt c/o of Chest Pain: STAT if CP now or developed within 24 hours  1. Are you having CP right now? Slightly   2. Are you experiencing any other symptoms (ex. SOB, nausea, vomiting, sweating)? Nausea   3. How long have you been experiencing CP? 4-5 months   4. Is your CP continuous or coming and going? Weight in chest in continuous, pain is continuous but pain is worse on some days than others   5. Have you taken Nitroglycerin? No    Wants an appt to be seen in regards to this ASAP.  ?

## 2020-09-06 ENCOUNTER — Encounter: Payer: Self-pay | Admitting: Physical Medicine & Rehabilitation

## 2020-09-06 ENCOUNTER — Other Ambulatory Visit: Payer: Self-pay

## 2020-09-06 ENCOUNTER — Encounter
Payer: Managed Care, Other (non HMO) | Attending: Physical Medicine & Rehabilitation | Admitting: Physical Medicine & Rehabilitation

## 2020-09-06 VITALS — BP 112/73 | HR 67 | Temp 98.7°F | Ht 67.0 in | Wt 282.6 lb

## 2020-09-06 DIAGNOSIS — M1712 Unilateral primary osteoarthritis, left knee: Secondary | ICD-10-CM | POA: Diagnosis not present

## 2020-09-06 MED ORDER — DICLOFENAC SODIUM 75 MG PO TBEC: 75.0000 mg | DELAYED_RELEASE_TABLET | Freq: Two times a day (BID) | ORAL | 3 refills | Status: DC

## 2020-09-06 NOTE — Patient Instructions (Addendum)
You need to regularly stretch your hamstrings when you drive.   SUPPLEMENTS USEFUL FOR OSTEOARTHRITIS: OMEGA 3 FATTY ACIDS, TURMERIC, GINGER, TART CHERRY EXTRACT, CELERY SEED, GLUCOSAMINE WITH CHONDROITIN

## 2020-09-06 NOTE — Progress Notes (Signed)
Subjective:    Patient ID: Kelsey Morgan, female    DOB: [birth year], 50 y.o.   MRN: [id number]  HPI   Patient is here in follow-up of her chronic pain.  I last saw her over 2 years ago.  At that time we were focused focusing on her chronic left knee pain. Since I last saw her she has had worsening knee pain which has involved the right knee as well. SHe uses voltaren gel, voltaren tablets, epsom soaks, ice.   She had results with the last knee injection we performed.  Tramadol helped to a certain extent but she needs to drive regularly so is not really an option.  She has continued to struggle with her weight which does not help matters.   Pain Inventory Average Pain 8 Pain Right Now 10 My pain is constant, sharp, burning, tingling and aching  In the last 24 hours, has pain interfered with the following? General activity 10 Relation with others 10 Enjoyment of life 10 What TIME of day is your pain at its worst? morning , daytime, evening and night Sleep (in general) Poor  Pain is worse with: bending, sitting and standing Pain improves with: rest, heat/ice and medication Relief from Meds: 5  Family History  Problem Relation Age of Onset  . Hypertension Mother   . Kidney failure Mother   . Heart disease Mother        Onset in her 9s   Social History   Socioeconomic History  . Marital status: Single    Spouse name: Not on file  . Number of children: Not on file  . Years of education: Not on file  . Highest education level: Not on file  Occupational History  . Occupation: Truck [employer] doctor  Tobacco Use  . Smoking status: Former Smoker    Packs/day: 0.25    Years: 25.00    Pack years: 6.25    Types: Cigarettes    Quit date: 01/01/2018    Years since quitting: 2.6  . Smokeless tobacco: Never Used  Vaping Use  . Vaping Use: Never used  Substance and Sexual Activity  . Alcohol use: No  . Drug use: No  . Sexual activity: Not Currently  Other Topics Concern  . Not on  file  Social History Narrative   Lives with partner.     Social Determinants of Health   Financial Resource Strain: Not on file  Food Insecurity: Not on file  Transportation Needs: Not on file  Physical Activity: Not on file  Stress: Not on file  Social Connections: Not on file   Past Surgical History:  Procedure Laterality Date  . ABDOMINAL HYSTERECTOMY    . ANKLE SURGERY    . CHOLECYSTECTOMY    . EXCISION MASS NECK N/A 11/12/2017   Procedure: EXCISION POSTERIOR  NECK CYST;  Surgeon: Axel Filler, MD;  Location: [facility] OR;  Service: General;  Laterality: N/A;  . FOOT SURGERY    . FRACTURE SURGERY    . KNEE SURGERY     Past Surgical History:  Procedure Laterality Date  . ABDOMINAL HYSTERECTOMY    . ANKLE SURGERY    . CHOLECYSTECTOMY    . EXCISION MASS NECK N/A 11/12/2017   Procedure: EXCISION POSTERIOR  NECK CYST;  Surgeon: Axel Filler, MD;  Location: [facility] OR;  Service: General;  Laterality: N/A;  . FOOT SURGERY    . FRACTURE SURGERY    . KNEE SURGERY     Past Medical History:  Diagnosis Date  . Arthritis   . Back pain   . Bronchitis   . CHF (congestive heart failure) ([facility])   . Gout   . Pneumonia    BP 112/73   Pulse 67   Temp 98.7 F (37.1 C)   Ht 5\' 7"  (1.702 m)   Wt 282 lb 9.6 oz (128.2 kg)   SpO2 95%   BMI 44.26 kg/m   Opioid Risk Score:   Fall Risk Score:  `1  Depression screen PHQ 2/9  Depression screen PHQ 2/9 03/10/2018  Decreased Interest 0  Down, Depressed, Hopeless 0  PHQ - 2 Score 0  Altered sleeping 3  Tired, decreased energy 3  Change in appetite 1  Feeling bad or failure about yourself  0  Trouble concentrating 0  Moving slowly or fidgety/restless 0  Suicidal thoughts 0  PHQ-9 Score 7  Difficult doing work/chores Not difficult at all     Review of Systems  Musculoskeletal:       Knee pain  All other systems reviewed and are negative.      Objective:   Physical Exam  General: No acute distress HEENT: EOMI, oral  membranes moist Cards: reg rate  Chest: normal effort Abdomen: Soft, NT, ND Skin: dry, intact Extremities: no edema Psych: pleasant and appropriate Neuro: Pt is cognitively appropriate with normal insight, memory, and awareness. Cranial nerves 2-12 are intact. Sensory exam is normal. Reflexes are 2+ in all 4's. Fine motor coordination is intact. No tremors. Motor function is grossly 5/5.  Musculoskeletal:  Is antalgic bilaterally and needs extra time to move from sit to stand.  She has pain along bilateral knees along the medial lateral joint line.  Right seems more affected than the left today.  There is mild joint effusion noted.  She also painful in the right popliteal fossa and around both hamstring tendons on the right.  We performed a seated slump test today, she had further pain  she had further pain loss along  right posterior knee.                Assessment & Plan:  1. Acute on chronic left knee pain. Likely degenerative joint disease with meniscus injury   Plan: 1.re-trial of voltaren tablets 75 mg p.o. twice daily with food 2.   Patient's hamstrings are quite tight especially on her right side.  We reviewed hamstring stretches today in the office and I provided her handout which were reviewed further stretches she can do at home and when she is on the road 3.supplements for osteoarthritis were provided and discussed today 4.  Consider knee injections at next visit   15 minutes of time was spent with the patient in discussion and review of her information, examination and creation of treatment plan.  I will see her back when she is able to when she comes off the road.

## 2020-09-12 MED ORDER — DICLOFENAC SODIUM 1 % EX GEL: 1.0000 "application " | Freq: Four times a day (QID) | CUTANEOUS | 4 refills | Status: DC

## 2020-09-12 NOTE — Telephone Encounter (Signed)
voltaren gel refilled

## 2020-09-13 ENCOUNTER — Telehealth (HOSPITAL_COMMUNITY): Payer: Self-pay | Admitting: Physical Medicine & Rehabilitation

## 2020-09-13 MED ORDER — TIZANIDINE HCL 4 MG PO TABS: 4.0000 mg | ORAL_TABLET | Freq: Three times a day (TID) | ORAL | 2 refills | Status: DC | PRN

## 2020-09-13 NOTE — Telephone Encounter (Signed)
Tizanidine refilled

## 2020-10-10 ENCOUNTER — Ambulatory Visit: Payer: Managed Care, Other (non HMO) | Admitting: Cardiology

## 2020-10-23 ENCOUNTER — Other Ambulatory Visit: Payer: Self-pay | Admitting: Physical Medicine & Rehabilitation

## 2020-12-22 ENCOUNTER — Emergency Department (HOSPITAL_COMMUNITY)
Admission: EM | Admit: 2020-12-22 | Discharge: 2020-12-22 | Disposition: A | Discharge status: Home / Self Care | Payer: Managed Care, Other (non HMO)

## 2021-02-21 ENCOUNTER — Telehealth: Payer: Self-pay | Admitting: Podiatry

## 2021-02-21 NOTE — Telephone Encounter (Signed)
Patient called the office wanting a refill of her gabapentin.     Please advise.Marland Kitchen

## 2021-02-23 ENCOUNTER — Other Ambulatory Visit: Payer: Self-pay | Admitting: Podiatry

## 2021-02-23 MED ORDER — GABAPENTIN 100 MG PO CAPS: 100.0000 mg | ORAL_CAPSULE | Freq: Three times a day (TID) | ORAL | 1 refills | Status: DC

## 2021-02-23 NOTE — Telephone Encounter (Signed)
Prescription sent.  Thanks, Dr. Tamaira Ciriello

## 2021-02-26 NOTE — Telephone Encounter (Signed)
I called the patient to let her know that her RX has been sent.  

## 2021-06-28 ENCOUNTER — Telehealth: Payer: Self-pay

## 2021-06-28 NOTE — Telephone Encounter (Signed)
Approved PA case# [id number] start date: 06/28/2021 end date: 06/28/2022

## 2021-06-28 NOTE — Telephone Encounter (Signed)
PA submitted for Diclofenac gel

## 2021-07-04 ENCOUNTER — Encounter: Payer: Managed Care, Other (non HMO) | Admitting: Physical Medicine & Rehabilitation

## 2021-07-18 ENCOUNTER — Other Ambulatory Visit: Payer: Self-pay

## 2021-07-18 ENCOUNTER — Encounter: Payer: Self-pay | Admitting: Physical Medicine & Rehabilitation

## 2021-07-18 ENCOUNTER — Encounter
Payer: Managed Care, Other (non HMO) | Attending: Physical Medicine & Rehabilitation | Admitting: Physical Medicine & Rehabilitation

## 2021-07-18 VITALS — BP 131/77 | HR 65 | Temp 98.4°F | Ht 67.0 in | Wt 278.2 lb

## 2021-07-18 DIAGNOSIS — M1712 Unilateral primary osteoarthritis, left knee: Secondary | ICD-10-CM | POA: Diagnosis not present

## 2021-07-18 DIAGNOSIS — M7582 Other shoulder lesions, left shoulder: Secondary | ICD-10-CM | POA: Insufficient documentation

## 2021-07-18 MED ORDER — DICLOFENAC SODIUM 75 MG PO TBEC: 75.0000 mg | DELAYED_RELEASE_TABLET | Freq: Two times a day (BID) | ORAL | 3 refills | Status: DC

## 2021-07-18 NOTE — Patient Instructions (Signed)
PLEASE FEEL FREE TO CALL OUR OFFICE WITH ANY PROBLEMS OR QUESTIONS ([phone])      

## 2021-07-18 NOTE — Progress Notes (Deleted)
Subjective:    Patient ID: Kelsey Morgan, female    DOB: 1971-05-11, 51 y.o.   MRN: [id number]  HPI  Pain Inventory Average Pain 8 Pain Right Now 8 My pain is constant, sharp, burning, stabbing, tingling, and aching  In the last 24 hours, has pain interfered with the following? General activity 8 Relation with others 8 Enjoyment of life 8 What TIME of day is your pain at its worst? morning , daytime, evening, and night Sleep (in general) Poor  Pain is worse with: walking, bending, sitting, inactivity, and standing Pain improves with: heat/ice Relief from Meds: 0  Family History  Problem Relation Age of Onset   Hypertension Mother    Kidney failure Mother    Heart disease Mother        Onset in her 81s   Social History   Socioeconomic History   Marital status: Single    Spouse name: Not on file   Number of children: Not on file   Years of education: Not on file   Highest education level: Not on file  Occupational History   Occupation: Truck driver  Tobacco Use   Smoking status: Former    Packs/day: 0.25    Years: 25.00    Pack years: 6.25    Types: Cigarettes    Quit date: 01/01/2018    Years since quitting: 3.5   Smokeless tobacco: Never  Vaping Use   Vaping Use: Never used  Substance and Sexual Activity   Alcohol use: No   Drug use: No   Sexual activity: Not Currently  Other Topics Concern   Not on file  Social History Narrative   Lives with partner.     Social Determinants of Health   Financial Resource Strain: Not on file  Food Insecurity: Not on file  Transportation Needs: Not on file  Physical Activity: Not on file  Stress: Not on file  Social Connections: Not on file   Past Surgical History:  Procedure Laterality Date   ABDOMINAL HYSTERECTOMY     ANKLE SURGERY     CHOLECYSTECTOMY     EXCISION MASS NECK N/A 11/12/2017   Procedure: EXCISION POSTERIOR  NECK CYST;  Surgeon: Ralene Ok, MD;  Location: [facility];   Service: General;  Laterality: N/A;   FOOT SURGERY     FRACTURE SURGERY     KNEE SURGERY     Past Surgical History:  Procedure Laterality Date   ABDOMINAL HYSTERECTOMY     ANKLE SURGERY     CHOLECYSTECTOMY     EXCISION MASS NECK N/A 11/12/2017   Procedure: EXCISION POSTERIOR  NECK CYST;  Surgeon: Ralene Ok, MD;  Location: [facility];  Service: General;  Laterality: N/A;   FOOT SURGERY     FRACTURE SURGERY     KNEE SURGERY     Past Medical History:  Diagnosis Date   Arthritis    Back pain    Bronchitis    CHF (congestive heart failure) (HCC)    Gout    Pneumonia    BP 131/77    Pulse 65    Temp 98.4 F (36.9 C)    Ht 5\' 7"  (1.702 m)    Wt 278 lb 3.2 oz (126.2 kg)    SpO2 97%    BMI 43.57 kg/m   Opioid Risk Score:   Fall Risk Score:  `1  Depression screen PHQ 2/9  Depression screen [facility] 2/9 07/18/2021 03/10/2018  Decreased Interest 0 0  Down, Depressed, Hopeless  0 0  PHQ - 2 Score 0 0  Altered sleeping - 3  Tired, decreased energy - 3  Change in appetite - 1  Feeling bad or failure about yourself  - 0  Trouble concentrating - 0  Moving slowly or fidgety/restless - 0  Suicidal thoughts - 0  PHQ-9 Score - 7  Difficult doing work/chores - Not difficult at all     Review of Systems  Constitutional: Negative.   HENT: Negative.    Eyes: Negative.   Respiratory: Negative.    Cardiovascular: Negative.   Gastrointestinal: Negative.   Endocrine: Negative.   Genitourinary: Negative.   Musculoskeletal: Negative.   Skin: Negative.   Allergic/Immunologic: Negative.   Hematological: Negative.   Psychiatric/Behavioral:  Positive for sleep disturbance.       Objective:   Physical Exam        Assessment & Plan:

## 2021-07-18 NOTE — Progress Notes (Signed)
Subjective:    Patient ID: Kelsey Morgan, female    DOB: 11/14/1970, 51 y.o.   MRN: [id number]  HPI  Kelsey Morgan is here in follow up of her chronic pain. For the last 6 months she's had left shoulder pain. More recently over the last 1 to 2 months, she's had increased pain which starts at the shoulder and radiates to the neck  and even the occiput. She has pain when she has to lift anything up or when she puts on a shirt. Pain is worst when she has to lift the arm up in most instances. She reports some tingling in her left hand as well.  She does not recall a specific event which triggered the symptoms but in general her work is physical and she has maintained a full-time schedule.  She has used voltaren gel, po voltaren. Tizanidine helps temporarily. She has even tried horse linament.   Her left knee has been pretty manageable. She uses voltaren gel effectively.  She reports that her sciatica is even tolerable at this point.    Pain Inventory Average Pain 8 Pain Right Now 8 My pain is constant, sharp, burning, stabbing, tingling, and aching  In the last 24 hours, has pain interfered with the following? General activity 8 Relation with others 8 Enjoyment of life 8 What TIME of day is your pain at its worst? morning , daytime, evening, and night Sleep (in general) Poor  Pain is worse with: walking, bending, sitting, inactivity, and standing Pain improves with: heat/ice Relief from Meds: 0  Family History  Problem Relation Age of Onset   Hypertension Mother    Kidney failure Mother    Heart disease Mother        Onset in her 76s   Social History   Socioeconomic History   Marital status: Single    Spouse name: Not on file   Number of children: Not on file   Years of education: Not on file   Highest education level: Not on file  Occupational History   Occupation: Truck driver  Tobacco Use   Smoking status: Former    Packs/day: 0.25    Years: 25.00    Pack years: 6.25     Types: Cigarettes    Quit date: 01/01/2018    Years since quitting: 3.5   Smokeless tobacco: Never  Vaping Use   Vaping Use: Never used  Substance and Sexual Activity   Alcohol use: No   Drug use: No   Sexual activity: Not Currently  Other Topics Concern   Not on file  Social History Narrative   Lives with partner.     Social Determinants of Health   Financial Resource Strain: Not on file  Food Insecurity: Not on file  Transportation Needs: Not on file  Physical Activity: Not on file  Stress: Not on file  Social Connections: Not on file   Past Surgical History:  Procedure Laterality Date   ABDOMINAL HYSTERECTOMY     ANKLE SURGERY     CHOLECYSTECTOMY     EXCISION MASS NECK N/A 11/12/2017   Procedure: EXCISION POSTERIOR  NECK CYST;  Surgeon: Axel Filler, MD;  Location: [facility];  Service: General;  Laterality: N/A;   FOOT SURGERY     FRACTURE SURGERY     KNEE SURGERY     Past Surgical History:  Procedure Laterality Date   ABDOMINAL HYSTERECTOMY     ANKLE SURGERY     CHOLECYSTECTOMY     EXCISION  MASS NECK N/A 11/12/2017   Procedure: EXCISION POSTERIOR  NECK CYST;  Surgeon: Axel Filler, MD;  Location: [facility];  Service: General;  Laterality: N/A;   FOOT SURGERY     FRACTURE SURGERY     KNEE SURGERY     Past Medical History:  Diagnosis Date   Arthritis    Back pain    Bronchitis    CHF (congestive heart failure) (HCC)    Gout    Pneumonia    BP 131/77    Pulse 65    Temp 98.4 F (36.9 C)    Ht 5\' 7"  (1.702 m)    Wt 126.2 kg    SpO2 97%    BMI 43.57 kg/m   Opioid Risk Score:   Fall Risk Score:  `1  Depression screen PHQ 2/9  Depression screen [facility] 2/9 07/18/2021 03/10/2018  Decreased Interest 0 0  Down, Depressed, Hopeless 0 0  PHQ - 2 Score 0 0  Altered sleeping - 3  Tired, decreased energy - 3  Change in appetite - 1  Feeling bad or failure about yourself  - 0  Trouble concentrating - 0  Moving slowly or fidgety/restless - 0  Suicidal thoughts -  0  PHQ-9 Score - 7  Difficult doing work/chores - Not difficult at all     Review of Systems  Constitutional: Negative.   HENT: Negative.    Eyes: Negative.   Respiratory: Negative.    Cardiovascular: Negative.   Gastrointestinal: Negative.   Endocrine: Negative.   Genitourinary: Negative.   Musculoskeletal: Negative.   Skin: Negative.   Allergic/Immunologic: Negative.   Hematological: Negative.   Psychiatric/Behavioral:  Positive for sleep disturbance.       Objective:   Physical Exam General: No acute distress HEENT: NCAT, EOMI, oral membranes moist Cards: reg rate  Chest: normal effort Abdomen: Soft, NT, ND Skin: dry, intact Extremities: no edema Psych: pleasant and appropriate  Neuro: Pt is cognitively appropriate with normal insight, memory, and awareness. Cranial nerves 2-12 are intact. Sensory exam is normal. Reflexes are 2+ in all 4's. Fine motor coordination is intact. No tremors. Motor function is grossly 5/5.  Musculoskeletal:  Less antalgia with gait today. Left shoulder painful with ER/IR as well as impingement maneuver. Pain with palpation in subacromial area.  Anterior shoulder is nontender.  There is some pain with palpation over the mid left trapezius with palpable trigger point today.  Neck is less tender.  She has functional rotation and movement of the right shoulder.   Assessment & Plan:  1. Acute on chronic left knee pain. Likely degenerative joint disease with meniscus injury 2. Left rotator cuff tendonitis 3. Myofascial pain of left trap as well, may be secondary to RTC injury and overcompensation   Plan: After informed consent and preparation of the skin with betadine and isopropyl alcohol, I injected 6mg  (1cc) of celestone and 4cc of 1% lidocaine into left subacromial space via lateral approach. Additionally, aspiration was performed prior to injection. The patient tolerated well, and no complications were encountered. Afterward the area was cleaned  and dressed. Post- injection instructions were provided.   2.   RTC exercises were provided. Needs to avoid heavy lifting overhead especially with rotation 3. Refill voltaren 75mg  bid for now (with food) 4. Knee has been much improved. Continue voltaren gel/votaren po    15  minutes of time was spent with the patient in discussion and review of her information, examination and creation of treatment plan.  4 mos f/u scheduled

## 2021-09-01 ENCOUNTER — Emergency Department (HOSPITAL_COMMUNITY): Payer: Managed Care, Other (non HMO)

## 2021-09-01 ENCOUNTER — Other Ambulatory Visit: Payer: Self-pay

## 2021-09-01 ENCOUNTER — Inpatient Hospital Stay (HOSPITAL_COMMUNITY)
Admission: EM | Admit: 2021-09-01 | Discharge: 2021-09-03 | DRG: 292 | Disposition: A | Discharge status: Home / Self Care | Payer: Managed Care, Other (non HMO) | Attending: Family Medicine | Admitting: Family Medicine

## 2021-09-01 ENCOUNTER — Encounter (HOSPITAL_COMMUNITY): Payer: Self-pay | Admitting: Emergency Medicine

## 2021-09-01 DIAGNOSIS — Z79899 Other long term (current) drug therapy: Secondary | ICD-10-CM

## 2021-09-01 DIAGNOSIS — Z8701 Personal history of pneumonia (recurrent): Secondary | ICD-10-CM

## 2021-09-01 DIAGNOSIS — I5033 Acute on chronic diastolic (congestive) heart failure: Principal | ICD-10-CM

## 2021-09-01 DIAGNOSIS — E876 Hypokalemia: Secondary | ICD-10-CM

## 2021-09-01 DIAGNOSIS — Z20822 Contact with and (suspected) exposure to covid-19: Secondary | ICD-10-CM | POA: Diagnosis present

## 2021-09-01 DIAGNOSIS — R062 Wheezing: Secondary | ICD-10-CM

## 2021-09-01 DIAGNOSIS — Z87898 Personal history of other specified conditions: Secondary | ICD-10-CM

## 2021-09-01 DIAGNOSIS — E66813 Obesity, class 3: Secondary | ICD-10-CM | POA: Insufficient documentation

## 2021-09-01 DIAGNOSIS — R059 Cough, unspecified: Secondary | ICD-10-CM | POA: Diagnosis not present

## 2021-09-01 DIAGNOSIS — M109 Gout, unspecified: Secondary | ICD-10-CM | POA: Diagnosis present

## 2021-09-01 DIAGNOSIS — Z9049 Acquired absence of other specified parts of digestive tract: Secondary | ICD-10-CM

## 2021-09-01 DIAGNOSIS — Z8249 Family history of ischemic heart disease and other diseases of the circulatory system: Secondary | ICD-10-CM

## 2021-09-01 DIAGNOSIS — J441 Chronic obstructive pulmonary disease with (acute) exacerbation: Secondary | ICD-10-CM | POA: Diagnosis present

## 2021-09-01 DIAGNOSIS — Z6841 Body Mass Index (BMI) 40.0 and over, adult: Secondary | ICD-10-CM

## 2021-09-01 DIAGNOSIS — M1990 Unspecified osteoarthritis, unspecified site: Secondary | ICD-10-CM | POA: Diagnosis present

## 2021-09-01 DIAGNOSIS — Z66 Do not resuscitate: Secondary | ICD-10-CM | POA: Diagnosis present

## 2021-09-01 DIAGNOSIS — R0902 Hypoxemia: Secondary | ICD-10-CM | POA: Diagnosis present

## 2021-09-01 DIAGNOSIS — Z90710 Acquired absence of both cervix and uterus: Secondary | ICD-10-CM

## 2021-09-01 DIAGNOSIS — I509 Heart failure, unspecified: Secondary | ICD-10-CM

## 2021-09-01 DIAGNOSIS — Z720 Tobacco use: Secondary | ICD-10-CM

## 2021-09-01 DIAGNOSIS — Z885 Allergy status to narcotic agent status: Secondary | ICD-10-CM

## 2021-09-01 DIAGNOSIS — F17210 Nicotine dependence, cigarettes, uncomplicated: Secondary | ICD-10-CM | POA: Diagnosis present

## 2021-09-01 LAB — CBC WITH DIFFERENTIAL/PLATELET
Abs Immature Granulocytes: 0.02 10*3/uL (ref 0.00–0.07)
Basophils Absolute: 0.1 10*3/uL (ref 0.0–0.1)
Basophils Relative: 1 %
Eosinophils Absolute: 0.4 10*3/uL (ref 0.0–0.5)
Eosinophils Relative: 4 %
HCT: 45.3 % (ref 36.0–46.0)
Hemoglobin: 14.3 g/dL (ref 12.0–15.0)
Immature Granulocytes: 0 %
Lymphocytes Relative: 37 %
Lymphs Abs: 3.7 10*3/uL (ref 0.7–4.0)
MCH: 27.5 pg (ref 26.0–34.0)
MCHC: 31.6 g/dL (ref 30.0–36.0)
MCV: 87.1 fL (ref 80.0–100.0)
Monocytes Absolute: 0.7 10*3/uL (ref 0.1–1.0)
Monocytes Relative: 7 %
Neutro Abs: 5.1 10*3/uL (ref 1.7–7.7)
Neutrophils Relative %: 51 %
Platelets: 314 10*3/uL (ref 150–400)
RBC: 5.2 MIL/uL — ABNORMAL HIGH (ref 3.87–5.11)
RDW: 14.9 % (ref 11.5–15.5)
WBC: 9.9 10*3/uL (ref 4.0–10.5)
nRBC: 0 % (ref 0.0–0.2)

## 2021-09-01 LAB — RESP PANEL BY RT-PCR (FLU A&B, COVID) ARPGX2
Influenza A by PCR: NEGATIVE
Influenza B by PCR: NEGATIVE
SARS Coronavirus 2 by RT PCR: NEGATIVE

## 2021-09-01 LAB — BASIC METABOLIC PANEL
Anion gap: 8 (ref 5–15)
BUN: 14 mg/dL (ref 6–20)
CO2: 26 mmol/L (ref 22–32)
Calcium: 9.6 mg/dL (ref 8.9–10.3)
Chloride: 107 mmol/L (ref 98–111)
Creatinine, Ser: 1.09 mg/dL — ABNORMAL HIGH (ref 0.44–1.00)
GFR, Estimated: >60 mL/min (ref >60)
Glucose, Bld: 103 mg/dL — ABNORMAL HIGH (ref 70–99)
Potassium: 4.2 mmol/L (ref 3.5–5.1)
Sodium: 141 mmol/L (ref 135–145)

## 2021-09-01 LAB — GLUCOSE, CAPILLARY
Glucose-Capillary: 168 mg/dL — ABNORMAL HIGH (ref 70–99)
Glucose-Capillary: 176 mg/dL — ABNORMAL HIGH (ref 70–99)

## 2021-09-01 LAB — BRAIN NATRIURETIC PEPTIDE: B Natriuretic Peptide: 50.6 pg/mL (ref 0.0–100.0)

## 2021-09-01 LAB — D-DIMER, QUANTITATIVE: D-Dimer, Quant: 5.65 ug/mL-FEU — ABNORMAL HIGH (ref 0.00–0.50)

## 2021-09-01 LAB — TROPONIN I (HIGH SENSITIVITY)
Troponin I (High Sensitivity): 4 ng/L (ref <18)
Troponin I (High Sensitivity): 4 ng/L (ref <18)

## 2021-09-01 MED ORDER — IPRATROPIUM-ALBUTEROL 0.5-2.5 (3) MG/3ML IN SOLN
3.0000 mL | Freq: Four times a day (QID) | RESPIRATORY_TRACT | Status: DC
  Administered 2021-09-01 – 2021-09-03 (×6): 3 mL via RESPIRATORY_TRACT
  Filled 2021-09-01 (×7): qty 3

## 2021-09-01 MED ORDER — POTASSIUM CHLORIDE CRYS ER 20 MEQ PO TBCR
40.0000 meq | EXTENDED_RELEASE_TABLET | Freq: Every day | ORAL | Status: DC
  Administered 2021-09-02 – 2021-09-03 (×2): 40 meq via ORAL
  Filled 2021-09-01 (×2): qty 2

## 2021-09-01 MED ORDER — FUROSEMIDE 10 MG/ML IJ SOLN
80.0000 mg | Freq: Once | INTRAMUSCULAR | Status: AC
  Administered 2021-09-01: 80 mg via INTRAVENOUS
  Filled 2021-09-01: qty 8

## 2021-09-01 MED ORDER — IPRATROPIUM-ALBUTEROL 0.5-2.5 (3) MG/3ML IN SOLN
3.0000 mL | Freq: Once | RESPIRATORY_TRACT | Status: AC
  Administered 2021-09-01: 3 mL via RESPIRATORY_TRACT
  Filled 2021-09-01: qty 3

## 2021-09-01 MED ORDER — FUROSEMIDE 10 MG/ML IJ SOLN
40.0000 mg | Freq: Two times a day (BID) | INTRAMUSCULAR | Status: DC
  Administered 2021-09-01 – 2021-09-02 (×2): 40 mg via INTRAVENOUS
  Filled 2021-09-01 (×2): qty 4

## 2021-09-01 MED ORDER — ACETAMINOPHEN 325 MG PO TABS
650.0000 mg | ORAL_TABLET | Freq: Four times a day (QID) | ORAL | Status: DC | PRN
  Administered 2021-09-01 – 2021-09-02 (×2): 650 mg via ORAL
  Filled 2021-09-01 (×2): qty 2

## 2021-09-01 MED ORDER — ONDANSETRON HCL 4 MG/2ML IJ SOLN
4.0000 mg | Freq: Four times a day (QID) | INTRAMUSCULAR | Status: DC | PRN
  Administered 2021-09-02: 4 mg via INTRAVENOUS
  Filled 2021-09-01: qty 2

## 2021-09-01 MED ORDER — ACETAMINOPHEN 650 MG RE SUPP
650.0000 mg | Freq: Four times a day (QID) | RECTAL | Status: DC | PRN
Start: 2021-09-01 — End: 2021-09-03

## 2021-09-01 MED ORDER — IOHEXOL 350 MG/ML SOLN
100.0000 mL | Freq: Once | INTRAVENOUS | Status: AC | PRN
  Administered 2021-09-01: 100 mL via INTRAVENOUS

## 2021-09-01 MED ORDER — ENOXAPARIN SODIUM 60 MG/0.6ML IJ SOSY
60.0000 mg | PREFILLED_SYRINGE | INTRAMUSCULAR | Status: DC
  Administered 2021-09-01 – 2021-09-02 (×2): 60 mg via SUBCUTANEOUS
  Filled 2021-09-01 (×2): qty 0.6

## 2021-09-01 MED ORDER — GUAIFENESIN-DM 100-10 MG/5ML PO SYRP
5.0000 mL | ORAL_SOLUTION | ORAL | Status: DC | PRN
  Administered 2021-09-01 – 2021-09-02 (×3): 5 mL via ORAL
  Filled 2021-09-01 (×5): qty 10

## 2021-09-01 MED ORDER — METHYLPREDNISOLONE SODIUM SUCC 40 MG IJ SOLR
40.0000 mg | Freq: Once | INTRAMUSCULAR | Status: AC
  Administered 2021-09-01: 40 mg via INTRAVENOUS
  Filled 2021-09-01: qty 1

## 2021-09-01 MED ORDER — POTASSIUM CHLORIDE CRYS ER 20 MEQ PO TBCR
20.0000 meq | EXTENDED_RELEASE_TABLET | Freq: Once | ORAL | Status: AC
  Administered 2021-09-01: 20 meq via ORAL
  Filled 2021-09-01: qty 1

## 2021-09-01 MED ORDER — ONDANSETRON HCL 4 MG PO TABS: 4.0000 mg | ORAL_TABLET | Freq: Four times a day (QID) | ORAL | Status: DC | PRN

## 2021-09-01 MED ORDER — ALBUTEROL SULFATE (2.5 MG/3ML) 0.083% IN NEBU
2.5000 mg | INHALATION_SOLUTION | RESPIRATORY_TRACT | Status: DC | PRN
Start: 2021-09-01 — End: 2021-09-03

## 2021-09-01 MED ORDER — HYDRALAZINE HCL 25 MG PO TABS: 25.0000 mg | ORAL_TABLET | Freq: Four times a day (QID) | ORAL | Status: DC | PRN

## 2021-09-01 NOTE — ED Provider Notes (Addendum)
Accepted handoff at shift change from Theodis Blaze PA-C. Please see prior provider note for more detail.  ? ?Briefly: Patient is 51 y.o.  ? ?DDX: concern for Patient presents with 12 pounds of weight gain over 48 to 72 hours, concern for heart failure exacerbation, negative BNP, some pulmonary edema on chest x-ray, negative CTA PE study.  Likely diastolic heart failure.  Desaturated to 83% on room air while ambulating.  Has been fairly stable while seated. 1600 output on 80 mg of Lasix since she has been here.  Needs admission for acute on chronic heart failure exacerbation with desaturation on ambulation.  Patient aware of plan. ? ?Spoke with the hospitalist, Dr. Lonny Prude who agrees to plan for admission. ? ? ?  ?Anselmo Pickler, PA-C ?09/01/21 1603 ? ?  ?Anselmo Pickler, PA-C ?09/01/21 1642 ? ?  ?Daleen Bo, MD ?09/02/21 1649 ? ?

## 2021-09-01 NOTE — H&P (Signed)
?History and Physical  ? ? ?Patient: Kelsey Morgan [id number] DOB: Jul 09, 1970 ?DOA: 09/01/2021 ?DOS: the patient was seen and examined on 09/01/2021 ?PCP: Donald Prose, MD  ?Patient coming from: Home ? ?Chief Complaint:  ?Chief Complaint  ?Patient presents with  ? Cough  ? Weight Gain  ? ?HPI:  ?Kelsey Morgan is a 51 y.o. female with a history of heart failure, tobacco use. She presented secondary to recommendation from a walk-in clinic for fluid in her lungs. Patient reports going to a walk in clinic secondary to cough, wheezing and shortness of breath. Symptoms started about 9 days ago and have progressively worsened. She reports PND and orthopnea for the last week. Patient reports taking Nyquil, Dayquil, Alka Seltzer, Mucinex with no help. She report not being consistent with Lasix. 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 She reports some nausea. She reports chest pain from persistent cough. She also had some diarrhea which has resolved. ? ?Review of Systems: As mentioned in the history of present illness. All other systems reviewed and are negative. ?Past Medical History:  ?Diagnosis Date  ? Arthritis   ? Back pain   ? Bronchitis   ? CHF (congestive heart failure) ([place])   ? Gout   ? Pneumonia   ? ?Past Surgical History:  ?Procedure Laterality Date  ? ABDOMINAL HYSTERECTOMY    ? ANKLE SURGERY    ? CHOLECYSTECTOMY    ? EXCISION MASS NECK N/A 11/12/2017  ? Procedure: EXCISION POSTERIOR  NECK CYST;  Surgeon: Ralene Ok, MD;  Location: [place];  Service: General;  Laterality: N/A;  ? FOOT SURGERY    ? FRACTURE SURGERY    ? KNEE SURGERY    ? ?Social History:  reports that she has been smoking cigarettes. She has a 6.25 pack-year smoking history. She has never used smokeless tobacco. She reports that she does not drink alcohol and does not use drugs. ? ?Allergies  ?Allergen Reactions  ? Hydrocodone Itching  ? Percocet [Oxycodone-Acetaminophen] Itching  ? Vicodin [Hydrocodone-Acetaminophen]  Itching  ?  Tolerates with Benadryl. Tolerates acetaminophen alone.  ? ? ?Family History  ?Problem Relation Age of Onset  ? Hypertension Mother   ? Kidney failure Mother   ? Heart disease Mother   ?     Onset in her 48s  ? ? ?Prior to Admission medications   ?Medication Sig Start Date End Date Taking? Authorizing Provider  ?buPROPion (WELLBUTRIN XL) 150 MG 24 hr tablet TK 1 T PO QD IN THE MORNING ?Patient not taking: Reported on 07/18/2021 01/31/19   [provider]  ?diclofenac (VOLTAREN) 75 MG EC tablet Take 1 tablet (75 mg total) by mouth 2 (two) times daily with a meal. 07/18/21   Meredith Staggers, MD  ?diclofenac Sodium (VOLTAREN) 1 % GEL Apply 1 application topically 4 (four) times daily. Bilateral knees 09/12/20   Meredith Staggers, MD  ?doxycycline (VIBRA-TABS) 100 MG tablet Take 1 tablet (100 mg total) by mouth 2 (two) times daily. ?Patient not taking: Reported on 07/18/2021 02/22/19   Edrick Kins, DPM  ?fexofenadine (ALLEGRA) 180 MG tablet Take 1 tablet (180 mg total) by mouth daily. 03/11/18   Garner Nash, DO  ?fluticasone (FLONASE) 50 MCG/ACT nasal spray Place 2 sprays into both nostrils daily.    [provider]  ?furosemide (LASIX) 40 MG tablet TAKE 1 TABLET(40 MG) BY MOUTH DAILY 05/31/19   Minus Breeding, MD  ?gabapentin (NEURONTIN) 100 MG capsule Take 1  capsule (100 mg total) by mouth 3 (three) times daily. 02/23/21   Edrick Kins, DPM  ?Multiple Vitamin (MULTIVITAMIN WITH MINERALS) TABS tablet Take 1 tablet by mouth daily.    [provider]  ?omeprazole (PRILOSEC) 20 MG capsule Take 20 mg by mouth daily.    [provider]  ?Potassium Chloride ER 20 MEQ TBCR TK 1 T PO QD WF 10/02/18   [provider]  ?potassium chloride SA (K-DUR,KLOR-CON) 20 MEQ tablet Take 1 tablet (20 mEq total) by mouth daily. 03/23/18   Minus Breeding, MD  ?rosuvastatin (CRESTOR) 10 MG tablet Take 10 mg by mouth at bedtime. 07/09/21   [provider]  ?sertraline (ZOLOFT)  100 MG tablet Take 100 mg by mouth at bedtime.     [provider]  ?simvastatin (ZOCOR) 10 MG tablet TK 1 T PO QD IN THE EVE ?Patient not taking: Reported on 07/18/2021 12/18/18   [provider]  ?tiZANidine (ZANAFLEX) 4 MG tablet TAKE 1 TABLET(4 MG) BY MOUTH THREE TIMES DAILY AS NEEDED FOR MUSCLE SPASMS 10/23/20   Meredith Staggers, MD  ? ? ?Physical Exam: ?Vitals:  ? 09/01/21 1300 09/01/21 1315 09/01/21 1330 09/01/21 1430  ?BP: (!) 149/87 (!) 154/56 (!) 149/118 (!) 152/87  ?Pulse: 65 (!) 59 (!) 59 (!) 50  ?Resp: (!) 22 (!) 26 13 20   ?Temp:      ?TempSrc:      ?SpO2: 97% 96% 95% 92%  ?Weight:      ?Height:      ? ?General exam: Appears calm and comfortable ?Respiratory system: Moderate bilateral wheezing with good air movement. Respiratory effort normal, without accessory muscle usage. ?Cardiovascular system: S1 & S2 heard, RRR. No murmurs, rubs, gallops or clicks. ?Gastrointestinal system: Abdomen is nondistended, soft and nontender. No organomegaly or masses felt. Normal bowel sounds heard. ?Central nervous system: Alert and oriented. No focal neurological deficits. ?Musculoskeletal: Trace BLE edema. No calf tenderness ?Skin: No cyanosis. No rashes ?Psychiatry: Judgement and insight appear normal. Mood & affect appropriate. ?  ?Data Reviewed: ? ? ? ?Assessment and Plan: ?* Acute on chronic diastolic heart failure ([place]) ?Last EF of 65-70% with grade 1 diastolic dysfunction on previous Transthoracic Echocardiogram from 2019. BNP of 50.6 (patient with obesity) on admission. Negative troponin of 4 > 4. Patient with clinical signs/symptoms of acute heart failure exacerbation and associated 12 lb weight gain. Patient with non-adherence with Lasix, which has likely precipitated current episode. ?-Lasix 40 mg IV BID ?-Transthoracic Echocardiogram ?-Strict in/out and daily weights (standing) ?-Wean oxygen to room air and obtain ambulatory pulse ox qshift ?-Continue Crestor (pending medication  reconciliation) ? ?Wheezing ?Tobacco use history. Significant wheezing on exam. Likely contributing to current presentation. No official diagnosis of COPD. ?-Solu-medrol 40 mg IV x1 ?-Duoneb QID and q4 hours prn ?-Outpatient pulmonology or PCP follow-up for PFTs ? ?Hypokalemia ?Secondary to Lasix use. Potassium currently normal. ?-Continue potassium supplementation with diuresis ? ?Tobacco use ?Patient has a desire to quit. Patient follows with her PCP who is trying to find an adequate treatment plan. Patient declines nicotine patch/gum while inpatient. ? ?Obesity, Class III, BMI 40-49.9 (morbid obesity) ([place]) ?Body mass index is 44.93 kg/m?. ? ?History of prolonged Q-T interval on ECG ?Appears to be resolved on current EKG. ? ? ? ? ? Advance Care Planning: DNR ? ?Consults: None ? ?Family Communication: Partner at bedside ? ? ?Author: ?Cordelia Poche, MD ?09/01/2021 5:34 PM ? ?For on call review [email or web address].  ?

## 2021-09-01 NOTE — Hospital Course (Addendum)
Kelsey Morgan is a 51 y.o. female with a history of heart failure, tobacco use. She presented secondary to recommendation from a walk-in clinic for fluid in her lungs and sought care at the walk-in clinic secondary to cough, wheezing and shortness of breath. She was noted to have concern for acute heart failure. On admission, she was noted to have significant wheezing and dyspnea complicated by history of tobacco use. Diuresis started. Also started on steroids and duonebs. Oxygen required to keep O2 saturations stable and hypoxia resolved prior to discharge.  ?

## 2021-09-01 NOTE — Assessment & Plan Note (Signed)
Body mass index is 44.93 kg/m.

## 2021-09-01 NOTE — ED Triage Notes (Signed)
Patient reports gaining 12 lbs over the past 36 hours, has a non-productive cough and was sent by [facility]. Hx of heart failure. Reports this episode has been going on for about 9 days. Reports a cough, thinks she broke a rib again coughing and LE edema.  ?

## 2021-09-01 NOTE — Assessment & Plan Note (Addendum)
Patient has a desire to quit. Patient follows with her PCP who is trying to find an adequate treatment plan. Patient declines nicotine patch/gum while inpatient. Continue outpatient cessation attempts. ?

## 2021-09-01 NOTE — Assessment & Plan Note (Addendum)
Probable COPD exacerbation but no official diagnosis of COPD. Tobacco use history. Significant wheezing on exam. Likely contributing to current presentation. Patient managed with Solu-medrol, Azithromycin and Duonebs while inpatient with progressive improvement. Wheezing resolved on day of discharge. Discharge with prednisone burst, Azithromycin to complete a 3 day course and albuterol inhaler/nebulizer solution. ?

## 2021-09-01 NOTE — Assessment & Plan Note (Deleted)
-   Continue home Crestor °

## 2021-09-01 NOTE — Assessment & Plan Note (Addendum)
Last EF of 65-70% with grade 1 diastolic dysfunction on previous Transthoracic Echocardiogram from 2019. BNP of 50.6 (patient with obesity) on admission. Negative troponin of 4 > 4. Patient with clinical signs/symptoms of acute heart failure exacerbation and associated 12 lb weight gain. Patient with non-adherence with Lasix, which has likely precipitated current episode. Slight increase in creatinine after initial Lasix diuresis. Transthoracic Echocardiogram significant for normal EF and grade 1 diastolic heart failure. Weight of 122.7 Kg (270.6 lbs) on day of discharge. Continue home lasix (refill provided) and recommend cardiology follow-up. Continue Crestor. ?

## 2021-09-01 NOTE — ED Notes (Addendum)
Pt O2 stay around 84-85 while walking.  ?

## 2021-09-01 NOTE — Assessment & Plan Note (Addendum)
History of. No current hypokalemia noted. Secondary to Lasix use. Potassium currently normal. Potassium supplementation provided for diuresis. ?

## 2021-09-01 NOTE — ED Provider Notes (Signed)
?[facility]-EMERGENCY DEPT ?Provider Note ? ? ?CSN: [id number] ?Arrival date & time: 09/01/21  1033 ? ?  ? ?History ? ?Chief Complaint  ?Patient presents with  ? Cough  ? Weight Gain  ? ? ?Kelsey Morgan is a 51 y.o. female. With past medical history of prolonged QT, congestive heart failure who presents to the emergency department with increased work of breathing. ? ?States around 9 days ago she began having cough and cold symptoms. 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 She took OTC medication without relief of symptoms. States that about 2-3 days ago her "breathing changed." Felt like she had increased work of breathing and wheezing. Also had increased bilateral LE swelling. Thought maybe related to her heart failure so went to [facility] today who sent her here for evaluation. Endorses 12 lb weight gain in 48 hours. ? ?Last Echo 2019 with EF 65-70% ? ? ?Cough ?Associated symptoms: shortness of breath   ?Associated symptoms: no chest pain and no fever   ? ?  ? ?Home Medications ?Prior to Admission medications   ?Medication Sig Start Date End Date Taking? Authorizing Provider  ?buPROPion (WELLBUTRIN XL) 150 MG 24 hr tablet TK 1 T PO QD IN THE MORNING ?Patient not taking: Reported on 07/18/2021 01/31/19   [provider]  ?diclofenac (VOLTAREN) 75 MG EC tablet Take 1 tablet (75 mg total) by mouth 2 (two) times daily with a meal. 07/18/21   Ranelle Oyster, MD  ?diclofenac Sodium (VOLTAREN) 1 % GEL Apply 1 application topically 4 (four) times daily. Bilateral knees 09/12/20   Ranelle Oyster, MD  ?doxycycline (VIBRA-TABS) 100 MG tablet Take 1 tablet (100 mg total) by mouth 2 (two) times daily. ?Patient not taking: Reported on 07/18/2021 02/22/19   Felecia Shelling, DPM  ?fexofenadine (ALLEGRA) 180 MG tablet Take 1 tablet (180 mg total) by mouth daily. 03/11/18   Josephine Igo, DO  ?fluticasone (FLONASE) 50 MCG/ACT nasal spray Place 2 sprays into both nostrils daily.     [provider]  ?furosemide (LASIX) 40 MG tablet TAKE 1 TABLET(40 MG) BY MOUTH DAILY 05/31/19   Rollene Rotunda, MD  ?gabapentin (NEURONTIN) 100 MG capsule Take 1 capsule (100 mg total) by mouth 3 (three) times daily. 02/23/21   Felecia Shelling, DPM  ?Multiple Vitamin (MULTIVITAMIN WITH MINERALS) TABS tablet Take 1 tablet by mouth daily.    [provider]  ?omeprazole (PRILOSEC) 20 MG capsule Take 20 mg by mouth daily.    [provider]  ?Potassium Chloride ER 20 MEQ TBCR TK 1 T PO QD WF 10/02/18   [provider]  ?potassium chloride SA (K-DUR,KLOR-CON) 20 MEQ tablet Take 1 tablet (20 mEq total) by mouth daily. 03/23/18   Rollene Rotunda, MD  ?rosuvastatin (CRESTOR) 10 MG tablet Take 10 mg by mouth at bedtime. 07/09/21   [provider]  ?sertraline (ZOLOFT) 100 MG tablet Take 100 mg by mouth at bedtime.     [provider]  ?simvastatin (ZOCOR) 10 MG tablet TK 1 T PO QD IN THE EVE ?Patient not taking: Reported on 07/18/2021 12/18/18   [provider]  ?tiZANidine (ZANAFLEX) 4 MG tablet TAKE 1 TABLET(4 MG) BY MOUTH THREE TIMES DAILY AS NEEDED FOR MUSCLE SPASMS 10/23/20   Ranelle Oyster, MD  ?   ? ?Allergies    ?Hydrocodone, Percocet [oxycodone-acetaminophen], and Vicodin [hydrocodone-acetaminophen]   ? ?Review of Systems   ?Review of Systems  ?Constitutional:  Negative for fever.  ?Respiratory:  Positive for cough and shortness of breath.   ?Cardiovascular:  Positive for leg swelling. Negative for chest pain and palpitations.  ?Gastrointestinal:  Negative for abdominal pain, nausea and vomiting.  ?Genitourinary:  Negative for dysuria.  ?All other systems reviewed and are negative. ? ?Physical Exam ?Updated Vital Signs ?BP (!) 155/77 (BP Location: Left Arm)   Pulse (!) 57   Temp 98.8 ?F (37.1 ?C) (Oral)   Resp 19   Ht 5' 6.5" (1.689 m)   Wt 128.2 kg   SpO2 92%   BMI 44.93 kg/m?  ?Physical Exam ?Vitals and nursing note reviewed.  ?Constitutional:   ?    General: She is not in acute distress. ?   Appearance: Normal appearance. She is obese. She is not ill-appearing or toxic-appearing.  ?HENT:  ?   Head: Normocephalic.  ?   Nose: Nose normal.  ?   Mouth/Throat:  ?   Mouth: Mucous membranes are moist.  ?   Pharynx: Oropharynx is clear.  ?Eyes:  ?   General: No scleral icterus. ?   Extraocular Movements: Extraocular movements intact.  ?Cardiovascular:  ?   Rate and Rhythm: Normal rate and regular rhythm.  ?   Pulses: Normal pulses.     ?     Radial pulses are 2+ on the right side and 2+ on the left side.  ?   Heart sounds: No murmur heard. ?Pulmonary:  ?   Effort: Pulmonary effort is normal. No respiratory distress.  ?   Breath sounds: Examination of the right-upper field reveals wheezing. Examination of the left-upper field reveals wheezing. Examination of the right-middle field reveals wheezing. Examination of the left-middle field reveals wheezing. Examination of the right-lower field reveals wheezing and rales. Examination of the left-lower field reveals wheezing and rales. Wheezing and rales present.  ?Abdominal:  ?   General: Bowel sounds are normal. There is no distension.  ?   Palpations: Abdomen is soft.  ?   Tenderness: There is no abdominal tenderness.  ?Musculoskeletal:     ?   General: Normal range of motion.  ?   Cervical back: Neck supple.  ?   Right lower leg: 1+ Edema present.  ?   Left lower leg: 1+ Edema present.  ?Skin: ?   General: Skin is warm and dry.  ?   Capillary Refill: Capillary refill takes less than 2 seconds.  ?Neurological:  ?   General: No focal deficit present.  ?   Mental Status: She is alert and oriented to person, place, and time. Mental status is at baseline.  ?Psychiatric:     ?   Mood and Affect: Mood normal.     ?   Behavior: Behavior normal.     ?   Thought Content: Thought content normal.     ?   Judgment: Judgment normal.  ? ? ?ED Results / Procedures / Treatments   ?Labs ?(all labs ordered are listed, but only abnormal  results are displayed) ?Labs Reviewed  ?BASIC METABOLIC PANEL - Abnormal; Notable for the following components:  ?    Result Value  ? Glucose, Bld 103 (*)   ? Creatinine, Ser 1.09 (*)   ? All other components within normal limits  ?CBC WITH DIFFERENTIAL/PLATELET - Abnormal; Notable for the following components:  ? RBC 5.20 (*)   ? All other components within normal limits  ?D-DIMER, QUANTITATIVE - Abnormal; Notable for the following components:  ? D-Dimer, Quant 5.65 (*)   ?  All other components within normal limits  ?RESP PANEL BY RT-PCR (FLU A&B, COVID) [id number]  ?BRAIN NATRIURETIC PEPTIDE  ?TROPONIN I (HIGH SENSITIVITY)  ?TROPONIN I (HIGH SENSITIVITY)  ? ?EKG ?EKG Interpretation ? ?Date/Time:  Saturday September 01 2021 12:13:39 EDT ?Ventricular Rate:  60 ?PR Interval:  203 ?QRS Duration: 84 ?QT Interval:  426 ?QTC Calculation: 426 ?R Axis:   34 ?Text Interpretation: Sinus rhythm Anterior infarct, old No significant change since prior 10/19 Confirmed by Meridee ScoreButler, Michael [phone]([id number]) on 09/01/2021 12:17:05 PM ? ?Radiology ?DG Chest 2 View ? ?Result Date: 09/01/2021 ?CLINICAL DATA:  Shortness of breath EXAM: CHEST - 2 VIEW COMPARISON:  03/05/2018 FINDINGS: Transverse diameter of heart is increased. Central pulmonary vessels are more prominent. There is increase in interstitial markings in the parahilar regions and lower lung fields. There is no focal pulmonary consolidation. Lateral CP angles are clear. There is no pneumothorax. IMPRESSION: Cardiomegaly. There is prominence of central pulmonary vessels along with increased interstitial markings in both lungs suggesting CHF with interstitial pulmonary edema. Electronically Signed   By: Ernie AvenaPalani  Rathinasamy M.D.   On: 09/01/2021 11:19  ? ?CT Angio Chest PE W/Cm &/Or Wo Cm ? ?Result Date: 09/01/2021 ?CLINICAL DATA:  Cough, positive D-dimer EXAM: CT ANGIOGRAPHY CHEST WITH CONTRAST TECHNIQUE: Multidetector CT imaging of the chest was performed using the standard protocol during bolus  administration of intravenous contrast. Multiplanar CT image reconstructions and MIPs were obtained to evaluate the vascular anatomy. RADIATION DOSE REDUCTION: This exam was performed according to the departmental

## 2021-09-01 NOTE — Assessment & Plan Note (Signed)
Appears to be resolved on current EKG. ?

## 2021-09-02 ENCOUNTER — Observation Stay (HOSPITAL_COMMUNITY): Payer: Managed Care, Other (non HMO)

## 2021-09-02 DIAGNOSIS — Z66 Do not resuscitate: Secondary | ICD-10-CM | POA: Diagnosis present

## 2021-09-02 DIAGNOSIS — Z6841 Body Mass Index (BMI) 40.0 and over, adult: Secondary | ICD-10-CM | POA: Diagnosis not present

## 2021-09-02 DIAGNOSIS — Z79899 Other long term (current) drug therapy: Secondary | ICD-10-CM | POA: Diagnosis not present

## 2021-09-02 DIAGNOSIS — Z87898 Personal history of other specified conditions: Secondary | ICD-10-CM | POA: Diagnosis not present

## 2021-09-02 DIAGNOSIS — I509 Heart failure, unspecified: Secondary | ICD-10-CM

## 2021-09-02 DIAGNOSIS — I5033 Acute on chronic diastolic (congestive) heart failure: Secondary | ICD-10-CM

## 2021-09-02 DIAGNOSIS — Z8701 Personal history of pneumonia (recurrent): Secondary | ICD-10-CM | POA: Diagnosis not present

## 2021-09-02 DIAGNOSIS — J441 Chronic obstructive pulmonary disease with (acute) exacerbation: Secondary | ICD-10-CM | POA: Diagnosis present

## 2021-09-02 DIAGNOSIS — M109 Gout, unspecified: Secondary | ICD-10-CM | POA: Diagnosis present

## 2021-09-02 DIAGNOSIS — M1990 Unspecified osteoarthritis, unspecified site: Secondary | ICD-10-CM | POA: Diagnosis present

## 2021-09-02 DIAGNOSIS — Z8249 Family history of ischemic heart disease and other diseases of the circulatory system: Secondary | ICD-10-CM | POA: Diagnosis not present

## 2021-09-02 DIAGNOSIS — R0902 Hypoxemia: Secondary | ICD-10-CM | POA: Diagnosis present

## 2021-09-02 DIAGNOSIS — Z20822 Contact with and (suspected) exposure to covid-19: Secondary | ICD-10-CM | POA: Diagnosis present

## 2021-09-02 DIAGNOSIS — Z90710 Acquired absence of both cervix and uterus: Secondary | ICD-10-CM | POA: Diagnosis not present

## 2021-09-02 DIAGNOSIS — Z9049 Acquired absence of other specified parts of digestive tract: Secondary | ICD-10-CM | POA: Diagnosis not present

## 2021-09-02 DIAGNOSIS — Z885 Allergy status to narcotic agent status: Secondary | ICD-10-CM | POA: Diagnosis not present

## 2021-09-02 DIAGNOSIS — F17210 Nicotine dependence, cigarettes, uncomplicated: Secondary | ICD-10-CM | POA: Diagnosis present

## 2021-09-02 DIAGNOSIS — E876 Hypokalemia: Secondary | ICD-10-CM | POA: Diagnosis not present

## 2021-09-02 DIAGNOSIS — R059 Cough, unspecified: Secondary | ICD-10-CM | POA: Diagnosis present

## 2021-09-02 LAB — ECHOCARDIOGRAM COMPLETE
AR max vel: 2.72 cm2
AV Peak grad: 13 mmHg
Ao pk vel: 1.8 m/s
Area-P 1/2: 2.77 cm2
Calc EF: 68.7 %
Height: 66.5 in
S' Lateral: 3 cm
Single Plane A2C EF: 68.1 %
Single Plane A4C EF: 68.2 %
Weight: 4337.6 oz

## 2021-09-02 LAB — BASIC METABOLIC PANEL
Anion gap: 12 (ref 5–15)
BUN: 20 mg/dL (ref 6–20)
CO2: 24 mmol/L (ref 22–32)
Calcium: 9.8 mg/dL (ref 8.9–10.3)
Chloride: 103 mmol/L (ref 98–111)
Creatinine, Ser: 1.16 mg/dL — ABNORMAL HIGH (ref 0.44–1.00)
GFR, Estimated: 57 mL/min — ABNORMAL LOW (ref >60)
Glucose, Bld: 171 mg/dL — ABNORMAL HIGH (ref 70–99)
Potassium: 3.9 mmol/L (ref 3.5–5.1)
Sodium: 139 mmol/L (ref 135–145)

## 2021-09-02 LAB — HIV ANTIBODY (ROUTINE TESTING W REFLEX): HIV Screen 4th Generation wRfx: NONREACTIVE

## 2021-09-02 LAB — GLUCOSE, CAPILLARY: Glucose-Capillary: 165 mg/dL — ABNORMAL HIGH (ref 70–99)

## 2021-09-02 MED ORDER — GABAPENTIN 300 MG PO CAPS
300.0000 mg | ORAL_CAPSULE | Freq: Every day | ORAL | Status: DC
  Administered 2021-09-02: 300 mg via ORAL
  Filled 2021-09-02: qty 1

## 2021-09-02 MED ORDER — FUROSEMIDE 10 MG/ML IJ SOLN
40.0000 mg | Freq: Every day | INTRAMUSCULAR | Status: DC
  Administered 2021-09-03: 40 mg via INTRAVENOUS
  Filled 2021-09-02: qty 4

## 2021-09-02 MED ORDER — PANTOPRAZOLE SODIUM 40 MG PO TBEC
40.0000 mg | DELAYED_RELEASE_TABLET | Freq: Every day | ORAL | Status: DC
Start: 2021-09-02 — End: 2021-09-03
  Administered 2021-09-02 – 2021-09-03 (×2): 40 mg via ORAL
  Filled 2021-09-02 (×2): qty 1

## 2021-09-02 MED ORDER — BENZONATATE 100 MG PO CAPS
200.0000 mg | ORAL_CAPSULE | Freq: Three times a day (TID) | ORAL | Status: DC | PRN
  Administered 2021-09-02: 200 mg via ORAL
  Filled 2021-09-02: qty 2

## 2021-09-02 MED ORDER — ROSUVASTATIN CALCIUM 10 MG PO TABS
10.0000 mg | ORAL_TABLET | ORAL | Status: DC
  Administered 2021-09-02: 10 mg via ORAL
  Filled 2021-09-02: qty 1

## 2021-09-02 MED ORDER — AZITHROMYCIN 250 MG PO TABS
500.0000 mg | ORAL_TABLET | Freq: Every day | ORAL | Status: DC
  Administered 2021-09-02 – 2021-09-03 (×2): 500 mg via ORAL
  Filled 2021-09-02 (×2): qty 2

## 2021-09-02 MED ORDER — METHYLPREDNISOLONE SODIUM SUCC 40 MG IJ SOLR
40.0000 mg | Freq: Once | INTRAMUSCULAR | Status: AC
  Administered 2021-09-02: 40 mg via INTRAVENOUS
  Filled 2021-09-02: qty 1

## 2021-09-02 MED ORDER — HYDROCOD POLI-CHLORPHE POLI ER 10-8 MG/5ML PO SUER
5.0000 mL | Freq: Two times a day (BID) | ORAL | Status: DC | PRN
Start: 2021-09-02 — End: 2021-09-03
  Administered 2021-09-02 – 2021-09-03 (×2): 5 mL via ORAL
  Filled 2021-09-02 (×2): qty 5

## 2021-09-02 MED ORDER — SERTRALINE HCL 100 MG PO TABS
100.0000 mg | ORAL_TABLET | Freq: Every day | ORAL | Status: DC
  Administered 2021-09-02: 100 mg via ORAL
  Filled 2021-09-02: qty 1

## 2021-09-02 NOTE — Progress Notes (Signed)
SATURATION QUALIFICATIONS: (This note is used to comply with regulatory documentation for home oxygen) ? ?Patient Saturations on Room Air at Rest = 92% ? ?Patient Saturations on Room Air while Ambulating = 87% ? ?Patient Saturations on 3 Liters of oxygen while Ambulating = 92% ? ?Please briefly explain why patient needs home oxygen: continue to wean off oxygen with lasix ?

## 2021-09-02 NOTE — Progress Notes (Signed)
? ?PROGRESS NOTE ? ? ? ?[name]  [id number] DOB: 06/12/1970 DOA: 09/01/2021 ?PCP: Donald Prose, MD ? ? ?Brief Narrative: ?Kelsey Morgan is a 51 y.o. female with a history of heart failure, tobacco use. She presented secondary to recommendation from a walk-in clinic for fluid in her lungs and sought care at the walk-in clinic secondary to cough, wheezing and shortness of breath. She was noted to have concern for acute heart failure. On admission, she was noted to have significant wheezing and dyspnea complicated by history of tobacco use. Diuresis started. Also started on steroids and duonebs. Oxygen required to keep O2 saturations stable. ? ? ?Assessment and Plan: ?* Acute on chronic diastolic heart failure ([place]) ?Last EF of 65-70% with grade 1 diastolic dysfunction on previous Transthoracic Echocardiogram from 2019. BNP of 50.6 (patient with obesity) on admission. Negative troponin of 4 > 4. Patient with clinical signs/symptoms of acute heart failure exacerbation and associated 12 lb weight gain. Patient with non-adherence with Lasix, which has likely precipitated current episode. Slight increase in creatinine after initial Lasix diuresis. Transthoracic Echocardiogram significant for normal EF and grade 1 diastolic heart failure. ?-Switch to Lasix 40 mg IV daily ?-Strict in/out and daily weights (standing) ?-Wean oxygen to room air and obtain ambulatory pulse ox qshift ?-Continue Crestor ? ?Wheezing ?Tobacco use history. Significant wheezing on exam. Likely contributing to current presentation. No official diagnosis of COPD. Received one dose of Solu-medrol on admission. ?-Repeat Solu-medrol 40 mg IV x1 ?-Azithromycin ?-Duoneb QID and q4 hours prn ?-Outpatient pulmonology or PCP follow-up for PFTs ? ?Hypokalemia ?Secondary to Lasix use. Potassium currently normal. ?-Continue potassium supplementation with diuresis ? ?Tobacco use ?Patient has a desire to quit. Patient follows with her PCP who is trying  to find an adequate treatment plan. Patient declines nicotine patch/gum while inpatient. ? ?Obesity, Class III, BMI 40-49.9 (morbid obesity) ([place]) ?Body mass index is 44.93 kg/m?. ? ?History of prolonged Q-T interval on ECG ?Appears to be resolved on current EKG. ? ? ? ?DVT prophylaxis: Lovenox ?Code Status:   Code Status: DNR ?Family Communication: None at bedside ?Disposition Plan: Discharge home in 1-2 days pending ability to wean off oxygen ? ? ?Consultants:  ?None ? ?Procedures:  ?Transthoracic Echocardiogram (09/02/2021) ? ?Antimicrobials: ?Azithromycin PO ? ? ?Subjective: ?Patient reports continued dyspnea on exertion and is not near baseline functional capacity. Continues to have coughing but no sputum production. ? ?Objective: ?BP (!) 143/72 (BP Location: Right Arm)   Pulse 62   Temp 98.1 ?F (36.7 ?C) (Oral)   Resp 18   Ht 5' 6.5" (1.689 m)   Wt 123 kg   SpO2 95%   BMI 43.10 kg/m?  ? ?Examination: ? ?General exam: Appears calm and comfortable ?Respiratory system: Diffuse wheezing. Respiratory effort normal. ?Cardiovascular system: S1 & S2 heard, RRR. No murmurs, rubs, gallops or clicks. ?Gastrointestinal system: Abdomen is nondistended, soft and nontender. No organomegaly or masses felt. Normal bowel sounds heard. ?Central nervous system: Alert and oriented. No focal neurological deficits. ?Musculoskeletal: Trace-1+ BLE pitting  edema. No calf tenderness ?Skin: No cyanosis. No rashes ?Psychiatry: Judgement and insight appear normal. Mood & affect appropriate.  ? ? ?Data Reviewed: I have personally reviewed following labs and imaging studies ? ?CBC ?Lab Results  ?Component Value Date  ? WBC 9.9 09/01/2021  ? RBC 5.20 (H) 09/01/2021  ? HGB 14.3 09/01/2021  ? HCT 45.3 09/01/2021  ? MCV 87.1 09/01/2021  ? MCH 27.5 09/01/2021  ? PLT 314 09/01/2021  ?  MCHC 31.6 09/01/2021  ? RDW 14.9 09/01/2021  ? LYMPHSABS 3.7 09/01/2021  ? MONOABS 0.7 09/01/2021  ? EOSABS 0.4 09/01/2021  ? BASOSABS 0.1 09/01/2021   ? ? ? ?Last metabolic panel ?Lab Results  ?Component Value Date  ? NA 139 09/02/2021  ? K 3.9 09/02/2021  ? CL 103 09/02/2021  ? CO2 24 09/02/2021  ? BUN 20 09/02/2021  ? CREATININE 1.16 (H) 09/02/2021  ? GLUCOSE 171 (H) 09/02/2021  ? GFRNONAA 57 (L) 09/02/2021  ? GFRAA >60 06/15/2018  ? CALCIUM 9.8 09/02/2021  ? PHOS 4.0 03/01/2018  ? PROT 7.2 03/13/2018  ? ALBUMIN 3.4 (L) 03/13/2018  ? BILITOT 0.4 03/13/2018  ? ALKPHOS 65 03/13/2018  ? AST 15 03/13/2018  ? ALT 13 03/13/2018  ? ANIONGAP 12 09/02/2021  ? ? ?GFR: ?Estimated Creatinine Clearance: 76.6 mL/min (A) (by C-G formula based on SCr of 1.16 mg/dL (H)). ? ?Recent Results (from the past 240 hour(s))  ?Resp Panel by RT-PCR (Flu A&B, Covid) Nasopharyngeal Swab     Status: None  ? Collection Time: 09/01/21 11:43 AM  ? Specimen: Nasopharyngeal Swab; Nasopharyngeal(NP) swabs in vial transport medium  ?Result Value Ref Range Status  ? SARS Coronavirus 2 by RT PCR NEGATIVE NEGATIVE Final  ?  Comment: (NOTE) ?SARS-CoV-2 target nucleic acids are NOT DETECTED. ? ?The SARS-CoV-2 RNA is generally detectable in upper respiratory ?specimens during the acute phase of infection. The lowest ?concentration of SARS-CoV-2 viral copies this assay can detect is ?138 copies/mL. A negative result does not preclude SARS-Cov-2 ?infection and should not be used as the sole basis for treatment or ?other patient management decisions. A negative result may occur with  ?improper specimen collection/handling, submission of specimen other ?than nasopharyngeal swab, presence of viral mutation(s) within the ?areas targeted by this assay, and inadequate number of viral ?copies(<138 copies/mL). A negative result must be combined with ?clinical observations, patient history, and epidemiological ?information. The expected result is Negative. ? ?Fact Sheet for Patients:  ?EntrepreneurPulse.com.au ? ?Fact Sheet for Healthcare Providers:   ?IncredibleEmployment.be ? ?This test is no t yet approved or cleared by the [place] FDA and  ?has been authorized for detection and/or diagnosis of SARS-CoV-2 by ?FDA under an Emergency Use Authorization (EUA). This EUA will remain  ?in effect (meaning this test can be used) for the duration of the ?COVID-19 declaration under Section 564(b)(1) of the Act, 21 ?U.S.C.section 360bbb-3(b)(1), unless the authorization is terminated  ?or revoked sooner.  ? ? ?  ? Influenza A by PCR NEGATIVE NEGATIVE Final  ? Influenza B by PCR NEGATIVE NEGATIVE Final  ?  Comment: (NOTE) ?The Xpert Xpress SARS-CoV-2/FLU/RSV plus assay is intended as an aid ?in the diagnosis of influenza from Nasopharyngeal swab specimens and ?should not be used as a sole basis for treatment. Nasal washings and ?aspirates are unacceptable for Xpert Xpress SARS-CoV-2/FLU/RSV ?testing. ? ?Fact Sheet for Patients: ?EntrepreneurPulse.com.au ? ?Fact Sheet for Healthcare Providers: ?IncredibleEmployment.be ? ?This test is not yet approved or cleared by the [place] FDA and ?has been authorized for detection and/or diagnosis of SARS-CoV-2 by ?FDA under an Emergency Use Authorization (EUA). This EUA will remain ?in effect (meaning this test can be used) for the duration of the ?COVID-19 declaration under Section 564(b)(1) of the Act, 21 U.S.C. ?section 360bbb-3(b)(1), unless the authorization is terminated or ?revoked. ? ?Performed at [facility], [address] ?[place], [place] [address] ?  ?  ? ? ?Radiology Studies: ?DG Chest 2 View ? ?  Result Date: 09/01/2021 ?CLINICAL DATA:  Shortness of breath EXAM: CHEST - 2 VIEW COMPARISON:  03/05/2018 FINDINGS: Transverse diameter of heart is increased. Central pulmonary vessels are more prominent. There is increase in interstitial markings in the parahilar regions and lower lung fields. There is no focal pulmonary consolidation. Lateral CP  angles are clear. There is no pneumothorax. IMPRESSION: Cardiomegaly. There is prominence of central pulmonary vessels along with increased interstitial markings in both lungs suggesting CHF with interstitial pulmonary edema. Electronically Signed   By: Maryan Puls

## 2021-09-03 LAB — BASIC METABOLIC PANEL
Anion gap: 10 (ref 5–15)
BUN: 24 mg/dL — ABNORMAL HIGH (ref 6–20)
CO2: 24 mmol/L (ref 22–32)
Calcium: 9.7 mg/dL (ref 8.9–10.3)
Chloride: 105 mmol/L (ref 98–111)
Creatinine, Ser: 0.96 mg/dL (ref 0.44–1.00)
GFR, Estimated: >60 mL/min (ref >60)
Glucose, Bld: 126 mg/dL — ABNORMAL HIGH (ref 70–99)
Potassium: 4.3 mmol/L (ref 3.5–5.1)
Sodium: 139 mmol/L (ref 135–145)

## 2021-09-03 MED ORDER — BENZONATATE 200 MG PO CAPS: 200.0000 mg | ORAL_CAPSULE | Freq: Three times a day (TID) | ORAL | 0 refills | Status: DC | PRN

## 2021-09-03 MED ORDER — FUROSEMIDE 40 MG PO TABS: 40.0000 mg | ORAL_TABLET | Freq: Every day | ORAL | 2 refills | Status: AC

## 2021-09-03 MED ORDER — AZITHROMYCIN 250 MG PO TABS: 500.0000 mg | ORAL_TABLET | Freq: Every day | ORAL | 0 refills | Status: AC

## 2021-09-03 MED ORDER — GUAIFENESIN ER 600 MG PO TB12: 1200.0000 mg | ORAL_TABLET | Freq: Two times a day (BID) | ORAL | 0 refills | Status: AC

## 2021-09-03 MED ORDER — PREDNISONE 20 MG PO TABS: 40.0000 mg | ORAL_TABLET | Freq: Every day | ORAL | 0 refills | Status: AC

## 2021-09-03 MED ORDER — ALBUTEROL SULFATE (2.5 MG/3ML) 0.083% IN NEBU: INHALATION_SOLUTION | RESPIRATORY_TRACT | 2 refills | Status: DC

## 2021-09-03 MED ORDER — GUAIFENESIN ER 600 MG PO TB12
1200.0000 mg | ORAL_TABLET | Freq: Two times a day (BID) | ORAL | Status: DC
  Administered 2021-09-03: 1200 mg via ORAL
  Filled 2021-09-03: qty 2

## 2021-09-03 MED ORDER — ALBUTEROL SULFATE HFA 108 (90 BASE) MCG/ACT IN AERS
INHALATION_SPRAY | RESPIRATORY_TRACT | 2 refills | Status: AC
Start: 2021-09-03 — End: 2021-10-06

## 2021-09-03 NOTE — Discharge Summary (Signed)
?Physician Discharge Summary ?  ?Patient: Kelsey Morgan MRN: [id number] DOB: [birth year]  ?Admit date:     09/01/2021  ?Discharge date: 09/03/21  ?Discharge Physician: Cordelia Poche, MD  ? ?PCP: Donald Prose, MD  ? ?Recommendations at discharge:  ? ?Follow-up with PCP and cardiologist ?Needs PFTs to rule in/out COPD ?On-going tobacco cessation discussions/treatments ? ?Discharge Diagnoses: ?Principal Problem: ?  Acute on chronic diastolic heart failure ([address]) ?Active Problems: ?  Wheezing ?  Hypokalemia ?  History of prolonged Q-T interval on ECG ?  Obesity, Class III, BMI 40-49.9 (morbid obesity) ([place]) ?  Tobacco use ?  Acute on chronic congestive heart failure ([place]) ? ?Resolved Problems: ?  * No resolved hospital problems. * ? ?Hospital Course: ?Kelsey Morgan is a 51 y.o. female with a history of heart failure, tobacco use. She presented secondary to recommendation from a walk-in clinic for fluid in her lungs and sought care at the walk-in clinic secondary to cough, wheezing and shortness of breath. She was noted to have concern for acute heart failure. On admission, she was noted to have significant wheezing and dyspnea complicated by history of tobacco use. Diuresis started. Also started on steroids and duonebs. Oxygen required to keep O2 saturations stable and hypoxia resolved prior to discharge.  ? ?Assessment and Plan: ?* Acute on chronic diastolic heart failure ([place]) ?Last EF of 65-70% with grade 1 diastolic dysfunction on previous Transthoracic Echocardiogram from 2019. BNP of 50.6 (patient with obesity) on admission. Negative troponin of 4 > 4. Patient with clinical signs/symptoms of acute heart failure exacerbation and associated 12 lb weight gain. Patient with non-adherence with Lasix, which has likely precipitated current episode. Slight increase in creatinine after initial Lasix diuresis. Transthoracic Echocardiogram significant for normal EF and grade 1 diastolic heart failure. Weight of 122.7 Kg  (270.6 lbs) on day of discharge. Continue home lasix (refill provided) and recommend cardiology follow-up. Continue Crestor. ? ?Wheezing ?Probable COPD exacerbation but no official diagnosis of COPD. Tobacco use history. Significant wheezing on exam. Likely contributing to current presentation. Patient managed with Solu-medrol, Azithromycin and Duonebs while inpatient with progressive improvement. Wheezing resolved on day of discharge. Discharge with prednisone burst, Azithromycin to complete a 3 day course and albuterol inhaler/nebulizer solution. ? ?Hypokalemia ?History of. No current hypokalemia noted. Secondary to Lasix use. Potassium currently normal. Potassium supplementation provided for diuresis. ? ?Tobacco use ?Patient has a desire to quit. Patient follows with her PCP who is trying to find an adequate treatment plan. Patient declines nicotine patch/gum while inpatient. Continue outpatient cessation attempts. ? ?Obesity, Class III, BMI 40-49.9 (morbid obesity) ([place]) ?Body mass index is 44.93 kg/m?. ? ?History of prolonged Q-T interval on ECG ?Appears to be resolved on current EKG. ? ? ? ? ?  ? ? ?Consultants: None ?Procedures performed: TRANSTHORACIC ECHOCARDIOGRAM   ?Disposition: Home ?Diet recommendation:  ?Discharge Diet Orders (From admission, onward)  ? ?  Start     Ordered  ? 09/03/21 0000  Diet - low sodium heart healthy       ? 09/03/21 1038  ? ?  ?  ? ?  ? ?Cardiac diet ?DISCHARGE MEDICATION: ?Allergies as of 09/03/2021   ? ?   Reactions  ? Hydrocodone Itching  ? Percocet [oxycodone-acetaminophen] Itching  ? Vicodin [hydrocodone-acetaminophen] Itching  ? Tolerates with Benadryl. Tolerates acetaminophen alone.  ? ?  ? ?  ?Medication List  ?  ? ?STOP taking these medications   ? ?diclofenac 75 MG EC tablet ?Commonly known  as: VOLTAREN ?  ?doxycycline 100 MG tablet ?Commonly known as: VIBRA-TABS ?  ?fexofenadine 180 MG tablet ?Commonly known as: ALLEGRA ?  ? ?  ? ?TAKE these medications   ? ?albuterol  108 (90 Base) MCG/ACT inhaler ?Commonly known as: VENTOLIN HFA ?Inhale 2 puffs into the lungs every 4 (four) hours while awake for 3 days, THEN 2 puffs every 4 (four) hours as needed for wheezing or shortness of breath. Take your inhaler OR your nebulizer.Marland Kitchen ?Start taking on: September 03, 2021 ?  ?albuterol (2.5 MG/3ML) 0.083% nebulizer solution ?Commonly known as: PROVENTIL ?Take 3 mLs (2.5 mg total) by nebulization every 4 (four) hours while awake for 3 days, THEN 3 mLs (2.5 mg total) every 4 (four) hours as needed for wheezing or shortness of breath. Take your nebulizer OR your inhaler. ?Start taking on: September 03, 2021 ?  ?azithromycin 250 MG tablet ?Commonly known as: ZITHROMAX ?Take 2 tablets (500 mg total) by mouth daily for 1 day. ?Start taking on: September 04, 2021 ?  ?benzonatate 200 MG capsule ?Commonly known as: TESSALON ?Take 1 capsule (200 mg total) by mouth 3 (three) times daily as needed for cough. ?  ?diclofenac Sodium 1 % Gel ?Commonly known as: VOLTAREN ?Apply 1 application topically 4 (four) times daily. Bilateral knees ?What changed:  ?when to take this ?reasons to take this ?  ?fluticasone 50 MCG/ACT nasal spray ?Commonly known as: FLONASE ?Place 2 sprays into both nostrils daily as needed for allergies. ?  ?furosemide 40 MG tablet ?Commonly known as: LASIX ?Take 1 tablet (40 mg total) by mouth daily. ?What changed: See the new instructions. ?  ?gabapentin 100 MG capsule ?Commonly known as: NEURONTIN ?Take 1 capsule (100 mg total) by mouth 3 (three) times daily. ?What changed:  ?how much to take ?when to take this ?  ?guaiFENesin 600 MG 12 hr tablet ?Commonly known as: [place] ?Take 2 tablets (1,200 mg total) by mouth 2 (two) times daily for 5 days. ?  ?multivitamin with minerals Tabs tablet ?Take 1 tablet by mouth daily. ?  ?omeprazole 20 MG capsule ?Commonly known as: PRILOSEC ?Take 20 mg by mouth daily. ?  ?potassium chloride SA 20 MEQ tablet ?Commonly known as: KLOR-CON M ?Take 1 tablet (20 mEq total)  by mouth daily. ?  ?predniSONE 20 MG tablet ?Commonly known as: DELTASONE ?Take 2 tablets (40 mg total) by mouth daily with breakfast for 3 days. ?  ?rosuvastatin 10 MG tablet ?Commonly known as: CRESTOR ?Take 10 mg by mouth every other day. ?  ?sertraline 100 MG tablet ?Commonly known as: ZOLOFT ?Take 100 mg by mouth at bedtime. ?  ?tiZANidine 4 MG tablet ?Commonly known as: ZANAFLEX ?TAKE 1 TABLET(4 MG) BY MOUTH THREE TIMES DAILY AS NEEDED FOR MUSCLE SPASMS ?What changed: See the new instructions. ?  ? ?  ? ?  ?  ? ? ?  ?Durable Medical Equipment  ?(From admission, onward)  ?  ? ? ?  ? ?  Start     Ordered  ? 09/03/21 1032  For home use only DME Nebulizer machine  Once       ?Question Answer Comment  ?Patient needs a nebulizer to treat with the following condition Wheezing   ?Patient needs a nebulizer to treat with the following condition Acute bronchitis   ?Length of Need Lifetime   ?  ? 09/03/21 1032  ? ?  ?  ? ?  ? ? Follow-up Information   ? ? Donald Prose, MD.  Schedule an appointment as soon as possible for a visit in 1 week(s).   ?Specialty: Family Medicine ?Why: For hospital follow-up ?Contact information: ?[address], [address] ?[place] [address] ?[phone] ? ? ?  ?  ? ? Buford Dresser, MD Follow up.   ?Specialty: Cardiology ?Why: For hospital follow-up ?Contact information: ?[address] ?[address] ?[place] [address] ?[phone] ? ? ?  ?  ? ?  ?  ? ?  ? ?Discharge Exam: ?Filed Weights  ? 09/01/21 1041 09/02/21 0500 09/03/21 0522  ?Weight: 128.2 kg 123 kg 122.7 kg  ? ?General exam: Appears calm and comfortable ?Respiratory system: Clear to auscultation. Respiratory effort normal. ?Cardiovascular system: S1 & S2 heard, RRR. No murmurs, rubs, gallops or clicks. ?Gastrointestinal system: Abdomen is nondistended, soft and nontender. No organomegaly or masses felt. Normal bowel sounds heard. ?Central nervous system: Alert and oriented. No focal neurological deficits. ?Psychiatry:  Judgement and insight appear normal. Mood & affect appropriate.  ? ?Condition at discharge: stable ? ?The results of significant diagnostics from this hospitalization (including imaging, microbiology, ancil

## 2021-09-03 NOTE — TOC Transition Note (Signed)
Transition of Care (TOC) - CM/SW Discharge Note ? ? ?Patient Details  ?Name: Kelsey Morgan ?MRN: [id number] ?Date of Birth: January 06, 1971 ? ?Transition of Care [facility]) CM/SW Contact:  ?Lanier Clam, RN ?Phone Number: ?09/03/2021, 10:45 AM ? ? ?Clinical Narrative: Patient has neb machine already @ home-even though ordered again. No order for home 02. No further CM needs.  ? ? ? ?Final next level of care: Home/Self Care ?Barriers to Discharge: No Barriers Identified ? ? ?Patient Goals and CMS Choice ?  ?  ?  ? ?Discharge Placement ?  ?           ?  ?  ?  ?  ? ?Discharge Plan and Services ?  ?  ?           ?  ?  ?  ?  ?  ?  ?  ?  ?  ?  ? ?Social Determinants of Health (SDOH) Interventions ?  ? ? ?Readmission Risk Interventions ?   ? View : No data to display.  ?  ?  ?  ? ? ? ? ? ?

## 2021-09-03 NOTE — Discharge Instructions (Addendum)
Christen Bame, ? ?You were in the hospital with exacerbation of your heart failure and concern for possible COPD (lung disease) likely related to tobacco use. ? ?Heart failure: You were treated with Lasix IV to remove excess fluid which has helped. Please continue your lasix and follow-up with your cardiologist. Please call your doctor if you notice a 3 lb weight gain in 24-48 hours. Your weight on discharge is 122.7 kg or 270.6 lbs but I recommend weighing yourself on your home scale and using that to assure consistent measurements ?Likely COPD: You were treated with nebulizer treatments, antibiotics and steroids with improvement of your symptoms. I am discharging you on continued breathing treatments, antibiotics and steroids. Please follow-up with your primary doctor and/or a pulmonologist (lung doctor) for evaluation and to obtain PFTs (Pulmonary Function Testing) ? ?It was a pleasure taking care of you and meeting your partner. ? ?Sincerely, ? ?Jacquelin Hawking, MD ? ?

## 2021-09-03 NOTE — Progress Notes (Signed)
Patient ambulated around 320 feet in hallway, tolerated very well. Stated this was much better than previous walk. O2 Sats remained around 90% RA, did drop to 88% RA a few times. Recovered to 92% RA at rest. Pt did c/o some chest tightness causing her nonproductive cough. MD Nettey aware.  ?

## 2021-10-02 ENCOUNTER — Institutional Professional Consult (permissible substitution): Payer: Managed Care, Other (non HMO) | Admitting: Pulmonary Disease

## 2021-10-02 NOTE — Progress Notes (Deleted)
Subjective:   PATIENT ID: Kelsey Morgan GENDER: female DOB: 01-23-1971, MRN: [id number]   HPI  No chief complaint on file.   Reason for Visit: ***Follow-up ***New consult for ***  *** Social History:  Environmental exposures: ***  I have personally reviewed patient's past medical/family/social history, allergies, current medications.***  Past Medical History:  Diagnosis Date   Arthritis    Back pain    Bronchitis    CHF (congestive heart failure) (HCC)    Gout    Pneumonia      Family History  Problem Relation Age of Onset   Hypertension Mother    Kidney failure Mother    Heart disease Mother        Onset in her 9s     Social History   Occupational History   Occupation: [employer] doctor  Tobacco Use   Smoking status: Every Day    Packs/day: 0.25    Years: 25.00    Pack years: 6.25    Types: Cigarettes    Last attempt to quit: 01/01/2018    Years since quitting: 3.7   Smokeless tobacco: Never  Vaping Use   Vaping Use: Never used  Substance and Sexual Activity   Alcohol use: No   Drug use: No   Sexual activity: Not Currently    Allergies  Allergen Reactions   Hydrocodone Itching   Percocet [Oxycodone-Acetaminophen] Itching   Vicodin [Hydrocodone-Acetaminophen] Itching    Tolerates with Benadryl. Tolerates acetaminophen alone.     Outpatient Medications Prior to Visit  Medication Sig Dispense Refill   albuterol (PROVENTIL) (2.5 MG/3ML) 0.083% nebulizer solution Take 3 mLs (2.5 mg total) by nebulization every 4 (four) hours while awake for 3 days, THEN 3 mLs (2.5 mg total) every 4 (four) hours as needed for wheezing or shortness of breath. Take your nebulizer OR your inhaler. 75 mL 2   albuterol (VENTOLIN HFA) 108 (90 Base) MCG/ACT inhaler Inhale 2 puffs into the lungs every 4 (four) hours while awake for 3 days, THEN 2 puffs every 4 (four) hours as needed for wheezing or shortness of breath. Take your inhaler OR your nebulizer.. 8 g 2    benzonatate (TESSALON) 200 MG capsule Take 1 capsule (200 mg total) by mouth 3 (three) times daily as needed for cough. 20 capsule 0   diclofenac Sodium (VOLTAREN) 1 % GEL Apply 1 application topically 4 (four) times daily. Bilateral knees (Patient taking differently: Apply 1 application. topically 4 (four) times daily as needed (pain). Bilateral knees) 350 g 4   fluticasone (FLONASE) 50 MCG/ACT nasal spray Place 2 sprays into both nostrils daily as needed for allergies.     furosemide (LASIX) 40 MG tablet Take 1 tablet (40 mg total) by mouth daily. 30 tablet 2   gabapentin (NEURONTIN) 100 MG capsule Take 1 capsule (100 mg total) by mouth 3 (three) times daily. (Patient taking differently: Take 300 mg by mouth at bedtime.) 60 capsule 1   Multiple Vitamin (MULTIVITAMIN WITH MINERALS) TABS tablet Take 1 tablet by mouth daily.     omeprazole (PRILOSEC) 20 MG capsule Take 20 mg by mouth daily.     potassium chloride SA (K-DUR,KLOR-CON) 20 MEQ tablet Take 1 tablet (20 mEq total) by mouth daily. 90 tablet 3   rosuvastatin (CRESTOR) 10 MG tablet Take 10 mg by mouth every other day.     sertraline (ZOLOFT) 100 MG tablet Take 100 mg by mouth at bedtime.      tiZANidine (ZANAFLEX) 4 MG  tablet TAKE 1 TABLET(4 MG) BY MOUTH THREE TIMES DAILY AS NEEDED FOR MUSCLE SPASMS (Patient taking differently: Take 4 mg by mouth at bedtime.) 60 tablet 2   No facility-administered medications prior to visit.    ROS   Objective:  There were no vitals filed for this visit.    Physical Exam: General: Well-appearing, no acute distress HENT: [place], AT, OP clear, MMM Eyes: EOMI, no scleral icterus Respiratory: Clear to auscultation bilaterally.  No crackles, wheezing or rales Cardiovascular: RRR, -M/R/G, no JVD GI: BS+, soft, nontender Extremities:-Edema,-tenderness Neuro: AAO x4, CNII-XII grossly intact Skin: Intact, no rashes or bruising Psych: Normal mood, normal affect  Data  Reviewed:  Imaging:  PFT:  Labs:      Assessment & Plan:   Discussion: ***  Health Maintenance Immunization History  Administered Date(s) Administered   Influenza,inj,Quad PF,6+ Mos 03/01/2018   Influenza-Unspecified 02/18/2019   PFIZER(Purple Top)SARS-COV-2 Vaccination 08/16/2019, 09/07/2019   Pneumococcal Polysaccharide-23 03/01/2018   Tdap 02/14/2019   CT Lung Screen***  No orders of the defined types were placed in this encounter. No orders of the defined types were placed in this encounter.   No follow-ups on file.  I have spent a total time of***-minutes on the day of the appointment reviewing prior documentation, coordinating care and discussing medical diagnosis and plan with the patient/family. Imaging, labs and tests included in this note have been reviewed and interpreted independently by me.  Faron Tudisco Mechele Collin, MD [facility] 10/02/2021 7:56 AM  Office Number [phone]

## 2021-10-04 ENCOUNTER — Ambulatory Visit (HOSPITAL_BASED_OUTPATIENT_CLINIC_OR_DEPARTMENT_OTHER): Payer: Managed Care, Other (non HMO) | Admitting: Cardiology

## 2021-10-26 ENCOUNTER — Encounter (HOSPITAL_BASED_OUTPATIENT_CLINIC_OR_DEPARTMENT_OTHER): Payer: Self-pay

## 2021-10-26 ENCOUNTER — Ambulatory Visit (HOSPITAL_BASED_OUTPATIENT_CLINIC_OR_DEPARTMENT_OTHER): Payer: Managed Care, Other (non HMO) | Admitting: Cardiology

## 2021-11-14 ENCOUNTER — Encounter
Payer: Managed Care, Other (non HMO) | Attending: Physical Medicine & Rehabilitation | Admitting: Physical Medicine & Rehabilitation

## 2021-12-20 ENCOUNTER — Encounter: Payer: Self-pay | Admitting: Orthopaedic Surgery

## 2021-12-20 ENCOUNTER — Ambulatory Visit (INDEPENDENT_AMBULATORY_CARE_PROVIDER_SITE_OTHER): Payer: Managed Care, Other (non HMO)

## 2021-12-20 ENCOUNTER — Ambulatory Visit: Payer: Managed Care, Other (non HMO) | Admitting: Orthopaedic Surgery

## 2021-12-20 VITALS — Ht 66.0 in | Wt 284.2 lb

## 2021-12-20 DIAGNOSIS — G8929 Other chronic pain: Secondary | ICD-10-CM | POA: Diagnosis not present

## 2021-12-20 DIAGNOSIS — M1712 Unilateral primary osteoarthritis, left knee: Secondary | ICD-10-CM | POA: Diagnosis not present

## 2021-12-20 DIAGNOSIS — M25562 Pain in left knee: Secondary | ICD-10-CM

## 2021-12-20 MED ORDER — LIDOCAINE HCL 1 % IJ SOLN
2.0000 mL | INTRAMUSCULAR | Status: AC | PRN
  Administered 2021-12-20: 2 mL

## 2021-12-20 MED ORDER — BUPIVACAINE HCL 0.25 % IJ SOLN
2.0000 mL | INTRAMUSCULAR | Status: AC | PRN
  Administered 2021-12-20: 2 mL via INTRA_ARTICULAR

## 2021-12-20 MED ORDER — METHYLPREDNISOLONE ACETATE 40 MG/ML IJ SUSP
80.0000 mg | INTRAMUSCULAR | Status: AC | PRN
  Administered 2021-12-20: 80 mg via INTRA_ARTICULAR

## 2021-12-20 NOTE — Progress Notes (Signed)
Office Visit Note  . This patient is diagnosed with osteoarthritis of the knee(s).    Radiographs show evidence of joint space narrowing, osteophytes, subchondral sclerosis and/or subchondral cysts.  This patient has knee pain which interferes with functional and activities of daily living.    This patient has experienced inadequate response, adverse effects and/or intolerance with conservative treatments such as acetaminophen, NSAIDS, topical creams, physical therapy or regular exercise, knee bracing and/or weight loss.   This patient has experienced inadequate response or has a contraindication to intra articular steroid injections for at least 3 months.   This patient is not scheduled to have a total knee replacement within 6 months of starting treatment with viscosupplementation.  Patient: Kelsey Morgan           Date of Birth: Sep 30, 1970           MRN: [id number] Visit Date: 12/20/2021              Requested by: Deatra James, MD [id number] [place],  [place] [address] PCP: Deatra James, MD   Assessment & Plan: Visit Diagnoses:  1. Chronic pain of left knee   2. Unilateral primary osteoarthritis, left knee     Plan: Mrs. Harshman has a long history of recurrent left knee pain.  She relates a prior knee arthroscopy in either 1998 or 1999 for torn meniscus.  She has had recurrent episodes of pain since that time and has had at least 2 injections in the past 6 to 8 months the last being in January.  She had an exacerbation of her pain this past Sunday and was seen at the [facility] walk-in clinic and was prescribed ice and Voltaren gel.  She has had trouble with anti-inflammatory medicines causing upset stomach.  She does drive long distance as a profession.  She has not had any recent injury or trauma.  She describes the knee as aching sharp and sometimes throbbing.  She sometimes has difficulty with the knee flexed for long periods of time and mostly along the medial aspect  of her knee.  Films demonstrate tricompartmental degenerative changes predominantly in the medial compartment.  Will reinject her knee with cortisone and pre-CERT viscosupplementation.  In the meantime she will try Voltaren gel, ice and Tylenol  Follow-Up Instructions: Return Precertify viscosupplementation.   Orders:  Orders Placed This Encounter  Procedures   XR KNEE 3 VIEW LEFT   No orders of the defined types were placed in this encounter.     Procedures: Large Joint Inj: L knee on 12/20/2021 2:43 PM Indications: pain and diagnostic evaluation Details: 25 G 1.5 in needle, anteromedial approach  Arthrogram: No  Medications: 2 mL lidocaine 1 %; 80 mg methylPREDNISolone acetate 40 MG/ML; 2 mL bupivacaine 0.25 % Procedure, treatment alternatives, risks and benefits explained, specific risks discussed. Consent was given by the patient. Patient was prepped and draped in the usual sterile fashion.       Clinical Data: No additional findings.   Subjective: Chief Complaint  Patient presents with   Left Knee - New Patient (Initial Visit)   Patient presents today as a new patient for her left knee pain. She states that she has received 2 injections in her left knee in the past six months. She states that she has been having aching sharp and throbbing pains noticed more while driving and completing sitting motions. Patient has under went a previous left knee scope in 1998 or 1999. Voltaren gel and  applying ice are the methods that she is currently using to help manage pain. She is currently employed as a [employer] architect.   Review of Systems   Objective: Vital Signs: Ht 5\' 6"  (1.676 m)   Wt 284 lb 3.2 oz (128.9 kg)   BMI 45.87 kg/m   Physical Exam Constitutional:      Appearance: She is well-developed.  Eyes:     Pupils: Pupils are equal, round, and reactive to light.  Pulmonary:     Effort: Pulmonary effort is normal.  Skin:    General: Skin is warm and dry.  Neurological:      Mental Status: She is alert and oriented to person, place, and time.  Psychiatric:        Behavior: Behavior normal.     Ortho Exam left knee was not hot red warm or swollen.  There seem to be a little bit of varus.  No effusion.  Mostly anterior medial joint pain.  No pain along the posterior medial or posterior lateral joint line.  Minimal patella crepitation no pain with patella compression.  Full extension flex at least 100 degrees without instability.  No popliteal pain or mass or calf discomfort.  Specialty Comments:  No specialty comments available.  Imaging: XR KNEE 3 VIEW LEFT  Result Date: 12/20/2021 Films of the left knee were obtained in 3 projections standing.  There are tricompartmental degenerative changes but predominantly in the medial compartment where there is narrowing of the joint space, subchondral sclerosis and peripheral osteophytes.  Alignment is about neutral.    PMFS History: Patient Active Problem List   Diagnosis Date Noted   Unilateral primary osteoarthritis, left knee 12/20/2021   Acute on chronic congestive heart failure (HCC) 09/02/2021   Acute on chronic diastolic heart failure (HCC) 09/01/2021   Wheezing 09/01/2021   Obesity, Class III, BMI 40-49.9 (morbid obesity) (HCC) 09/01/2021   Tobacco use 09/01/2021   Tendonitis of left rotator cuff 07/18/2021   SOB (shortness of breath) 03/23/2018   Primary localized osteoarthritis of left knee 03/10/2018   Acute exacerbation of CHF (congestive heart failure) (HCC) 02/27/2018   History of prolonged Q-T interval on ECG 02/27/2018   Bronchitis 02/27/2018   Lymphadenopathy, hilar 02/27/2018   Hypokalemia 02/27/2018   Chest pain 10/18/2016   Leucocytosis 10/18/2016   Past Medical History:  Diagnosis Date   Arthritis    Back pain    Bronchitis    CHF (congestive heart failure) (HCC)    Gout    Pneumonia     Family History  Problem Relation Age of Onset   Hypertension Mother    Kidney failure  Mother    Heart disease Mother        Onset in her 63s    Past Surgical History:  Procedure Laterality Date   ABDOMINAL HYSTERECTOMY     ANKLE SURGERY     CHOLECYSTECTOMY     EXCISION MASS NECK N/A 11/12/2017   Procedure: EXCISION POSTERIOR  NECK CYST;  Surgeon: 01/12/2018, MD;  Location: [facility] OR;  Service: General;  Laterality: N/A;   FOOT SURGERY     FRACTURE SURGERY     KNEE SURGERY     Social History   Occupational History   Occupation: Truck driver  Tobacco Use   Smoking status: Every Day    Packs/day: 0.25    Years: 25.00    Total pack years: 6.25    Types: Cigarettes    Last attempt to quit: 01/01/2018  Years since quitting: 3.9   Smokeless tobacco: Never  Vaping Use   Vaping Use: Never used  Substance and Sexual Activity   Alcohol use: No   Drug use: No   Sexual activity: Not Currently

## 2021-12-24 ENCOUNTER — Encounter: Payer: Self-pay | Admitting: Orthopaedic Surgery

## 2022-01-03 ENCOUNTER — Telehealth: Payer: Self-pay

## 2022-01-03 NOTE — Telephone Encounter (Signed)
VOB submitted Euflexxa, left knee.

## 2022-01-03 NOTE — Telephone Encounter (Signed)
-----   Message from Roxan Hockey, CMA sent at 12/20/2021  2:37 PM EDT ----- Regarding: gel approval Dr. Cleophas Dunker would like to get a gel approval for the left knee on this patient.

## 2022-01-17 ENCOUNTER — Other Ambulatory Visit: Payer: Self-pay

## 2022-01-17 DIAGNOSIS — G8929 Other chronic pain: Secondary | ICD-10-CM

## 2022-01-17 MED ORDER — MELOXICAM 7.5 MG PO TABS: 7.5000 mg | ORAL_TABLET | Freq: Every day | ORAL | 0 refills | Status: DC

## 2022-01-26 ENCOUNTER — Ambulatory Visit
Admission: RE | Admit: 2022-01-26 | Discharge: 2022-01-26 | Disposition: A | Discharge status: Home / Self Care | Payer: Managed Care, Other (non HMO) | Source: Ambulatory Visit | Attending: Physician Assistant | Admitting: Physician Assistant

## 2022-01-26 DIAGNOSIS — G8929 Other chronic pain: Secondary | ICD-10-CM

## 2022-02-06 ENCOUNTER — Ambulatory Visit: Payer: Managed Care, Other (non HMO) | Admitting: Orthopaedic Surgery

## 2022-02-07 ENCOUNTER — Ambulatory Visit: Payer: Managed Care, Other (non HMO) | Admitting: Orthopaedic Surgery

## 2022-02-17 ENCOUNTER — Other Ambulatory Visit: Payer: Self-pay | Admitting: Physician Assistant

## 2022-02-17 DIAGNOSIS — G8929 Other chronic pain: Secondary | ICD-10-CM

## 2022-02-20 ENCOUNTER — Encounter: Payer: Self-pay | Admitting: Orthopaedic Surgery

## 2022-02-20 ENCOUNTER — Telehealth: Payer: Self-pay

## 2022-02-20 ENCOUNTER — Ambulatory Visit: Payer: Managed Care, Other (non HMO) | Admitting: Orthopaedic Surgery

## 2022-02-20 DIAGNOSIS — S83242D Other tear of medial meniscus, current injury, left knee, subsequent encounter: Secondary | ICD-10-CM | POA: Diagnosis not present

## 2022-02-20 DIAGNOSIS — M1712 Unilateral primary osteoarthritis, left knee: Secondary | ICD-10-CM | POA: Diagnosis not present

## 2022-02-20 DIAGNOSIS — G8929 Other chronic pain: Secondary | ICD-10-CM

## 2022-02-20 DIAGNOSIS — M25562 Pain in left knee: Secondary | ICD-10-CM | POA: Diagnosis not present

## 2022-02-20 NOTE — Progress Notes (Signed)
Office Visit Note   Patient: Kelsey Morgan           Date of Birth: 1971/01/15           MRN: [id number] Visit Date: 02/20/2022              Requested by: Donald Prose, MD [address] [place],  [place] [address] PCP: Donald Prose, MD   Assessment & Plan: Visit Diagnoses:  1. Chronic pain of left knee   2. Primary localized osteoarthritis of left knee   3. Other tear of medial meniscus, current injury, left knee, subsequent encounter     Plan: Patient is a pleasant 51 year old woman who we have seen for ongoing problems with her left knee.  She had failed conservative treatment.  Most of her pain is on the medial side of the knee.  Unfortunately she cannot take anti-inflammatories because of stomach upset.  An MRI of her knee was ordered she is here to review this today.  The MRI did demonstrate extensive degenerative tearing of the posterior horn and body of the medial meniscus meniscus is partially extruded from the joint.  She also has considerable arthritis of the medial compartment.  She has had injections in the past which did not even give her temporary relief relief.  She is status post left knee arthroscopy in 1998.  Very difficult to determine whether most of her pain is from her arthritis on the medial compartment or from the meniscus tear.  We discussed with her referral for an opinion with regards to this with Dr. Sammuel Hines.  She may be a candidate for a partial knee replacement versus a knee arthroscopy.  She does understand her BMI at 44 needs to be at least 38 or so to consider any kind of joint replacement.  In the meantime we have put in authorization for viscosupplementation and she could get these to temporize some of her symptoms especially if it is more secondary to arthritis.  She continues to work full-time  Follow-Up Instructions: Return with Dr. Sammuel Hines and Lurena Nida.   Orders:  Orders Placed This Encounter  Procedures   Ambulatory referral to Orthopedic  Surgery   No orders of the defined types were placed in this encounter.     Procedures: No procedures performed   Clinical Data: No additional findings.   Subjective: Chief Complaint  Patient presents with   Left Knee - Follow-up    MRI review  Patient presents today for follow up on her left knee. She had an MRI and is here today to discuss those results.    Review of Systems  All other systems reviewed and are negative.    Objective: Vital Signs: There were no vitals taken for this visit.  Physical Exam Constitutional:      Appearance: Normal appearance. She is well-developed.  Eyes:     Pupils: Pupils are equal, round, and reactive to light.  Pulmonary:     Effort: Pulmonary effort is normal.  Skin:    General: Skin is warm and dry.  Neurological:     Mental Status: She is alert and oriented to person, place, and time.  Psychiatric:        Behavior: Behavior normal.     Ortho Exam Left knee no effusion no redness.  She does have slight varus alignment.  She has good varus valgus stability.  She is tender over the posterior medial joint line and the anterior medial joint line.  No significant tenderness laterally.  No significant tenderness over the patellofemoral joint compartments of the lower leg are soft and nontender Specialty Comments:  No specialty comments available.  Imaging: No results found.   PMFS History: Patient Active Problem List   Diagnosis Date Noted   Other tear of medial meniscus, current injury, left knee, subsequent encounter 02/20/2022   Unilateral primary osteoarthritis, left knee 12/20/2021   Acute on chronic congestive heart failure (HCC) 09/02/2021   Acute on chronic diastolic heart failure (HCC) 09/01/2021   Wheezing 09/01/2021   Obesity, Class III, BMI 40-49.9 (morbid obesity) (HCC) 09/01/2021   Tobacco use 09/01/2021   Tendonitis of left rotator cuff 07/18/2021   SOB (shortness of breath) 03/23/2018   Primary  localized osteoarthritis of left knee 03/10/2018   Acute exacerbation of CHF (congestive heart failure) (HCC) 02/27/2018   History of prolonged Q-T interval on ECG 02/27/2018   Bronchitis 02/27/2018   Lymphadenopathy, hilar 02/27/2018   Hypokalemia 02/27/2018   Chest pain 10/18/2016   Leucocytosis 10/18/2016   Past Medical History:  Diagnosis Date   Arthritis    Back pain    Bronchitis    CHF (congestive heart failure) (HCC)    Gout    Pneumonia     Family History  Problem Relation Age of Onset   Hypertension Mother    Kidney failure Mother    Heart disease Mother        Onset in her 48s    Past Surgical History:  Procedure Laterality Date   ABDOMINAL HYSTERECTOMY     ANKLE SURGERY     CHOLECYSTECTOMY     EXCISION MASS NECK N/A 11/12/2017   Procedure: EXCISION POSTERIOR  NECK CYST;  Surgeon: Axel Filler, MD;  Location: [facility] OR;  Service: General;  Laterality: N/A;   FOOT SURGERY     FRACTURE SURGERY     KNEE SURGERY     Social History   Occupational History   Occupation: Truck driver  Tobacco Use   Smoking status: Every Day    Packs/day: 0.25    Years: 25.00    Total pack years: 6.25    Types: Cigarettes    Last attempt to quit: 01/01/2018    Years since quitting: 4.1   Smokeless tobacco: Never  Vaping Use   Vaping Use: Never used  Substance and Sexual Activity   Alcohol use: No   Drug use: No   Sexual activity: Not Currently

## 2022-02-20 NOTE — Telephone Encounter (Signed)
-----   Message from Kelsey Morgan, RT sent at 02/20/2022  2:31 PM EDT ----- Patient was in the office and wondering about her approval for visco?

## 2022-02-20 NOTE — Telephone Encounter (Signed)
PA has been submitted to [facility] for Euflexxa, left knee. Faxed to [employer] at [phone] PA Pending

## 2022-02-21 ENCOUNTER — Telehealth: Payer: Self-pay

## 2022-02-21 NOTE — Telephone Encounter (Signed)
-----   Message from Lauren Greeson, RT sent at 02/20/2022  2:31 PM EDT ----- Patient was in the office and wondering about her approval for visco?  

## 2022-02-21 NOTE — Telephone Encounter (Signed)
Talked with patient and advised her that I am currently waiting on approval from her insurance and once approved, I will give her a call to schedule.  Patient voiced that she understands.

## 2022-02-22 ENCOUNTER — Ambulatory Visit (HOSPITAL_BASED_OUTPATIENT_CLINIC_OR_DEPARTMENT_OTHER): Payer: Managed Care, Other (non HMO) | Admitting: Orthopaedic Surgery

## 2022-02-22 DIAGNOSIS — M1712 Unilateral primary osteoarthritis, left knee: Secondary | ICD-10-CM | POA: Diagnosis not present

## 2022-02-22 NOTE — Progress Notes (Signed)
Chief Complaint: Left knee pain     History of Present Illness:    Kelsey Morgan is a 51 y.o. female presents today as a referral from my partner Dr. Cleophas Dunker with 5 months of left knee pain which has been really significant in most of her activities.  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  Getting into the truck is very difficult for her.  She states that the knee will occasionally lock.  With regard to her left knee she does have a history of arthroscopic surgery in 1998 which she develops believe a meniscal debridement was performed.  She takes Mobic and Tylenol for the pain although this does not help relieving it significantly.  She has had an injection in the left knee with Dr. Cleophas Dunker which did not give her any prolonged relief.  She is here today for further assessment.    Surgical History:   As above  PMH/PSH/Family History/Social History/Meds/Allergies:    Past Medical History:  Diagnosis Date   Arthritis    Back pain    Bronchitis    CHF (congestive heart failure) (HCC)    Gout    Pneumonia    Past Surgical History:  Procedure Laterality Date   ABDOMINAL HYSTERECTOMY     ANKLE SURGERY     CHOLECYSTECTOMY     EXCISION MASS NECK N/A 11/12/2017   Procedure: EXCISION POSTERIOR  NECK CYST;  Surgeon: Axel Filler, MD;  Location: [facility] OR;  Service: General;  Laterality: N/A;   FOOT SURGERY     FRACTURE SURGERY     KNEE SURGERY     Social History   Socioeconomic History   Marital status: Single    Spouse name: Not on file   Number of children: Not on file   Years of education: Not on file   Highest education level: Not on file  Occupational History   Occupation: Truck driver  Tobacco Use   Smoking status: Every Day    Packs/day: 0.25    Years: 25.00    Total pack years: 6.25    Types: Cigarettes    Last attempt to quit: 01/01/2018    Years since quitting: 4.1   Smokeless  tobacco: Never  Vaping Use   Vaping Use: Never used  Substance and Sexual Activity   Alcohol use: No   Drug use: No   Sexual activity: Not Currently  Other Topics Concern   Not on file  Social History Narrative   Lives with partner.     Social Determinants of Health   Financial Resource Strain: Not on file  Food Insecurity: Not on file  Transportation Needs: Not on file  Physical Activity: Not on file  Stress: Not on file  Social Connections: Not on file   Family History  Problem Relation Age of Onset   Hypertension Mother    Kidney failure Mother    Heart disease Mother        Onset in her 69s   Allergies  Allergen Reactions   Hydrocodone Itching   Percocet [Oxycodone-Acetaminophen] Itching   Vicodin [Hydrocodone-Acetaminophen] Itching    Tolerates with Benadryl. Tolerates acetaminophen alone.   Current Outpatient Medications  Medication Sig Dispense Refill   albuterol (PROVENTIL) (2.5 MG/3ML) 0.083% nebulizer solution Take 3 mLs (2.5  mg total) by nebulization every 4 (four) hours while awake for 3 days, THEN 3 mLs (2.5 mg total) every 4 (four) hours as needed for wheezing or shortness of breath. Take your nebulizer OR your inhaler. 75 mL 2   albuterol (VENTOLIN HFA) 108 (90 Base) MCG/ACT inhaler Inhale 2 puffs into the lungs every 4 (four) hours while awake for 3 days, THEN 2 puffs every 4 (four) hours as needed for wheezing or shortness of breath. Take your inhaler OR your nebulizer.. 8 g 2   benzonatate (TESSALON) 200 MG capsule Take 1 capsule (200 mg total) by mouth 3 (three) times daily as needed for cough. 20 capsule 0   diclofenac Sodium (VOLTAREN) 1 % GEL Apply 1 application topically 4 (four) times daily. Bilateral knees (Patient taking differently: Apply 1 application  topically 4 (four) times daily as needed (pain). Bilateral knees) 350 g 4   fluticasone (FLONASE) 50 MCG/ACT nasal spray Place 2 sprays into both nostrils daily as needed for allergies.      furosemide (LASIX) 40 MG tablet Take 1 tablet (40 mg total) by mouth daily. 30 tablet 2   gabapentin (NEURONTIN) 100 MG capsule Take 1 capsule (100 mg total) by mouth 3 (three) times daily. (Patient taking differently: Take 300 mg by mouth at bedtime.) 60 capsule 1   meloxicam (MOBIC) 7.5 MG tablet Take 1 tablet (7.5 mg total) by mouth daily. 30 tablet 0   Multiple Vitamin (MULTIVITAMIN WITH MINERALS) TABS tablet Take 1 tablet by mouth daily.     omeprazole (PRILOSEC) 20 MG capsule Take 20 mg by mouth daily.     potassium chloride SA (K-DUR,KLOR-CON) 20 MEQ tablet Take 1 tablet (20 mEq total) by mouth daily. 90 tablet 3   rosuvastatin (CRESTOR) 10 MG tablet Take 10 mg by mouth every other day.     sertraline (ZOLOFT) 100 MG tablet Take 100 mg by mouth at bedtime.      tiZANidine (ZANAFLEX) 4 MG tablet TAKE 1 TABLET(4 MG) BY MOUTH THREE TIMES DAILY AS NEEDED FOR MUSCLE SPASMS (Patient taking differently: Take 4 mg by mouth at bedtime.) 60 tablet 2   No current facility-administered medications for this visit.   No results found.  Review of Systems:   A ROS was performed including pertinent positives and negatives as documented in the HPI.  Physical Exam :   Constitutional: NAD and appears stated age Neurological: Alert and oriented Psych: Appropriate affect and cooperative There were no vitals taken for this visit.   Comprehensive Musculoskeletal Exam:      Musculoskeletal Exam  Gait Normal  Alignment Normal   Right Left  Inspection Normal Normal  Palpation    Tenderness none Medial and lateral joint line  Crepitus None Positive  Effusion None None  Range of Motion    Extension -3 -3  Flexion 135 130  Strength    Extension 5/5 5/5  Flexion 5/5 5/5  Ligament Exam     Generalized Laxity No No  Lachman Negative Negative   Pivot Shift Negative Negative  Anterior Drawer Negative Negative  Valgus at 0 Negative Negative  Valgus at 20 Negative Negative  Varus at 0 0 0  Varus  at 20   0 0  Posterior Drawer at 90 0 0  Vascular/Lymphatic Exam    Edema None None  Venous Stasis Changes No No  Distal Circulation Normal Normal  Neurologic    Light Touch Sensation Intact Intact  Special Tests:      Imaging:  Xray (4 views left knee): Predominantly medial joint space osteoarthritis with periarticular osteophytes  MRI (left knee): There is a diminutive medial meniscus consistent with previous meniscectomy as well as severe chondral loss medially but also laterally  I personally reviewed and interpreted the radiographs.   Assessment:   51 y.o. female presents with left knee pain in the setting of medial and lateral tibiofemoral osteoarthritis.  I did describe that overall given the fact that she did not have any relief from injections I do not necessarily believe that an additional injection will help her out.  I did describe that her knee is quite a tough situation.  Overall on reviewing the MRI she does have evidence of lateral femoral condyle cartilage loss and to that effect I do not necessarily believe a uni compartmental knee replacement or arthroscopy would provide her any type of prolonged relief.  I did discuss that the most permanent surgical intervention for her would be arthroplasty.  I did describe that overall she is somewhat of a difficult case in the sense that her x-ray does not show severe osteoarthritis but MRI does show significant lateral femoral condyle cartilage loss.  Her BMI is on the borderline at 42 but she recently was able to lose 15 pounds and is working on losing more.  At this time I would like her to get a opinion from my partner Dr. Ninfa Linden as I do believe that knee arthroplasty would be the most definitive intervention for her given the fact that she does have lateral femoral condyle pain and involvement on MRI  Plan :    -Referral for Dr. Ninfa Linden for an opinion if she is a candidate for knee arthroplasty     I personally saw and  evaluated the patient, and participated in the management and treatment plan.  Vanetta Mulders, MD Attending Physician, Orthopedic Surgery  This document was dictated using Dragon voice recognition software. A reasonable attempt at proof reading has been made to minimize errors.

## 2022-02-26 ENCOUNTER — Ambulatory Visit: Payer: Managed Care, Other (non HMO) | Admitting: Orthopaedic Surgery

## 2022-02-26 ENCOUNTER — Encounter: Payer: Self-pay | Admitting: Orthopaedic Surgery

## 2022-02-26 VITALS — Ht 65.0 in | Wt 273.6 lb

## 2022-02-26 DIAGNOSIS — G8929 Other chronic pain: Secondary | ICD-10-CM | POA: Diagnosis not present

## 2022-02-26 DIAGNOSIS — M1712 Unilateral primary osteoarthritis, left knee: Secondary | ICD-10-CM | POA: Diagnosis not present

## 2022-02-26 DIAGNOSIS — M25562 Pain in left knee: Secondary | ICD-10-CM | POA: Diagnosis not present

## 2022-02-26 MED ORDER — CELECOXIB 200 MG PO CAPS: 200.0000 mg | ORAL_CAPSULE | Freq: Two times a day (BID) | ORAL | 1 refills | Status: DC

## 2022-02-26 NOTE — Progress Notes (Signed)
The patient is seen to my partners as it relates to significant left knee osteoarthritis.  She comes in today referred to me for considering knee replacement surgery for her left knee.  She has tried and failed all forms of conservative treatment.  She has plain films and MRI showing significant cartilage loss with her left knee.  She is only 51 years old.  She does work as a Administrator.  Her weight today is 273 pounds with a BMI of 45.  She understands fully that we need her to discontinue lose weight before we can schedule surgery due to the high complication rate of joint replacements on people over 40 BMI.  She is doing a lot to lose weight and reports a 15 pound weight loss due to diet modification.  She is on Mobic but she says it is not really helping.  Examination of her left knee shows neutral alignment.  She has a large soft tissue envelope around her left knee but is not insurmountable.  I would like her to still lose some weight before we can feel comfortable scheduling surgery.  She understands this as well.  I will see her back in 6 weeks for the repeat weight and BMI calculation.  We will stop her meloxicam and try Celebrex instead and see if this will help with her pain.  All questions and concerns were answered and addressed.  She agrees with this treatment plan.

## 2022-03-06 ENCOUNTER — Other Ambulatory Visit: Payer: Self-pay

## 2022-03-06 DIAGNOSIS — M1712 Unilateral primary osteoarthritis, left knee: Secondary | ICD-10-CM

## 2022-03-08 ENCOUNTER — Encounter: Payer: Self-pay | Admitting: Physician Assistant

## 2022-03-08 ENCOUNTER — Ambulatory Visit: Payer: Managed Care, Other (non HMO) | Admitting: Physician Assistant

## 2022-03-08 DIAGNOSIS — M1712 Unilateral primary osteoarthritis, left knee: Secondary | ICD-10-CM

## 2022-03-08 MED ORDER — SODIUM HYALURONATE (VISCOSUP) 20 MG/2ML IX SOSY
20.0000 mg | PREFILLED_SYRINGE | INTRA_ARTICULAR | Status: AC | PRN
  Administered 2022-03-08: 20 mg via INTRA_ARTICULAR

## 2022-03-08 NOTE — Progress Notes (Signed)
Office Visit Note   Patient: Kelsey Morgan           Date of Birth: 12/01/1970           MRN: [id number] Visit Date: 03/08/2022              Requested by: Deatra James, MD [phone] [place],  [place] [address] PCP: Deatra James, MD  Chief Complaint  Patient presents with  . Left Knee - Follow-up      HPI: Pleasant 51 year old woman comes in for her first injection into her left knee of Euflexxa.  Assessment & Plan: Visit Diagnoses:  1. Primary localized osteoarthritis of left knee     Plan: Patient tolerated injection well will return in 1 week  Follow-Up Instructions: Return in about 2 weeks (around 03/22/2022).   Ortho Exam  Patient is alert, oriented, no adenopathy, well-dressed, normal affect, normal respiratory effort. Examination of her left knee no effusion no redness no erythema.  Compartments of the lower leg are soft and nontender  Imaging: No results found. No images are attached to the encounter.  Labs: Lab Results  Component Value Date   ESRSEDRATE 39 (H) 02/27/2018   ESRSEDRATE 28 (H) 03/28/2017   CRP 1.2 (H) 03/13/2018   CRP 4.6 (H) 02/27/2018   LABURIC 7.1 03/13/2018     Lab Results  Component Value Date   ALBUMIN 3.4 (L) 03/13/2018   ALBUMIN 3.3 (L) 03/01/2018   ALBUMIN 3.5 02/28/2018    Lab Results  Component Value Date   MG 1.6 03/11/2018   MG 1.9 03/01/2018   MG 2.1 02/28/2018   No results found for: "VD25OH"  No results found for: "PREALBUMIN"    Latest Ref Rng & Units 09/01/2021   11:43 AM 03/13/2018    2:31 PM 03/11/2018   12:43 PM  CBC EXTENDED  WBC 4.0 - 10.5 K/uL 9.9  17.3  17.3   RBC 3.87 - 5.11 MIL/uL 5.20  4.95  5.10   Hemoglobin 12.0 - 15.0 g/dL 83.1  51.7  61.6   HCT 36.0 - 46.0 % 45.3  42.5  42.3   Platelets 150 - 400 K/uL 314  347  379.0   NEUT# 1.7 - 7.7 K/uL 5.1  11.5  14.1   Lymph# 0.7 - 4.0 K/uL 3.7  4.3  1.9      There is no height or weight on file to calculate BMI.  Orders:   No orders of the defined types were placed in this encounter.  No orders of the defined types were placed in this encounter.    Procedures: Large Joint Inj: L knee on 03/08/2022 11:22 AM Indications: pain and diagnostic evaluation Details: 22 G 1.5 in needle, anteromedial approach  Arthrogram: No  Medications: 20 mg Sodium Hyaluronate (Viscosup) 20 MG/2ML Outcome: tolerated well, no immediate complications Procedure, treatment alternatives, risks and benefits explained, specific risks discussed. Consent was given by the patient.    Clinical Data: No additional findings.  ROS:  All other systems negative, except as noted in the HPI. Review of Systems  Objective: Vital Signs: There were no vitals taken for this visit.  Specialty Comments:  No specialty comments available.  PMFS History: Patient Active Problem List   Diagnosis Date Noted  . Other tear of medial meniscus, current injury, left knee, subsequent encounter 02/20/2022  . Unilateral primary osteoarthritis, left knee 12/20/2021  . Acute on chronic congestive heart failure (HCC) 09/02/2021  . Acute on chronic  diastolic heart failure ([place]) 09/01/2021  . Wheezing 09/01/2021  . Obesity, Class III, BMI 40-49.9 (morbid obesity) ([place]) 09/01/2021  . Tobacco use 09/01/2021  . Tendonitis of left rotator cuff 07/18/2021  . SOB (shortness of breath) 03/23/2018  . Primary localized osteoarthritis of left knee 03/10/2018  . Acute exacerbation of CHF (congestive heart failure) ([place]) 02/27/2018  . History of prolonged Q-T interval on ECG 02/27/2018  . Bronchitis 02/27/2018  . Lymphadenopathy, hilar 02/27/2018  . Hypokalemia 02/27/2018  . Chest pain 10/18/2016  . Leucocytosis 10/18/2016   Past Medical History:  Diagnosis Date  . Arthritis   . Back pain   . Bronchitis   . CHF (congestive heart failure) ([place])   . Gout   . Pneumonia     Family History  Problem Relation Age of Onset  . Hypertension Mother   . Kidney  failure Mother   . Heart disease Mother        Onset in her 46s    Past Surgical History:  Procedure Laterality Date  . ABDOMINAL HYSTERECTOMY    . ANKLE SURGERY    . CHOLECYSTECTOMY    . EXCISION MASS NECK N/A 11/12/2017   Procedure: EXCISION POSTERIOR  NECK CYST;  Surgeon: Ralene Ok, MD;  Location: [facility];  Service: General;  Laterality: N/A;  . FOOT SURGERY    . FRACTURE SURGERY    . KNEE SURGERY     Social History   Occupational History  . Occupation: Truck Geophysicist/field seismologist  Tobacco Use  . Smoking status: Every Day    Packs/day: 0.25    Years: 25.00    Total pack years: 6.25    Types: Cigarettes    Last attempt to quit: 01/01/2018    Years since quitting: 4.1  . Smokeless tobacco: Never  Vaping Use  . Vaping Use: Never used  Substance and Sexual Activity  . Alcohol use: No  . Drug use: No  . Sexual activity: Not Currently

## 2022-03-22 ENCOUNTER — Ambulatory Visit: Payer: Managed Care, Other (non HMO) | Admitting: Physician Assistant

## 2022-03-23 ENCOUNTER — Other Ambulatory Visit: Payer: Self-pay | Admitting: Orthopaedic Surgery

## 2022-03-26 ENCOUNTER — Ambulatory Visit: Payer: Managed Care, Other (non HMO) | Admitting: Physician Assistant

## 2022-04-05 ENCOUNTER — Ambulatory Visit: Payer: Managed Care, Other (non HMO) | Admitting: Physician Assistant

## 2022-04-05 ENCOUNTER — Encounter: Payer: Self-pay | Admitting: Physician Assistant

## 2022-04-05 DIAGNOSIS — M1712 Unilateral primary osteoarthritis, left knee: Secondary | ICD-10-CM

## 2022-04-05 MED ORDER — SODIUM HYALURONATE (VISCOSUP) 20 MG/2ML IX SOSY
20.0000 mg | PREFILLED_SYRINGE | INTRA_ARTICULAR | Status: AC | PRN
  Administered 2022-04-05: 20 mg via INTRA_ARTICULAR

## 2022-04-05 NOTE — Progress Notes (Signed)
   Procedure Note  Patient: Kelsey Morgan             Date of Birth: [birth year]           MRN: [id number]             Visit Date: 04/05/2022  Procedures: Visit Diagnoses: No diagnosis found.  Large Joint Inj: L knee on 04/05/2022 11:20 AM Indications: pain and diagnostic evaluation Details: 22 G 1.5 in needle, anteromedial approach  Arthrogram: No  Medications: 20 mg Sodium Hyaluronate (Viscosup) 20 MG/2ML Outcome: tolerated well, no immediate complications Procedure, treatment alternatives, risks and benefits explained, specific risks discussed. Consent was given by the patient.   Patient is here for her second Euflexxa injection into her left knee.  She already thinks that is helped.  Her knee does not have any erythema warmth or swelling

## 2022-04-15 ENCOUNTER — Ambulatory Visit: Payer: Managed Care, Other (non HMO) | Admitting: Orthopaedic Surgery

## 2022-06-05 ENCOUNTER — Ambulatory Visit: Payer: Managed Care, Other (non HMO) | Admitting: Orthopaedic Surgery

## 2022-08-14 ENCOUNTER — Encounter
Payer: Managed Care, Other (non HMO) | Attending: Physical Medicine & Rehabilitation | Admitting: Physical Medicine & Rehabilitation

## 2022-08-14 ENCOUNTER — Encounter: Payer: Self-pay | Admitting: Physical Medicine & Rehabilitation

## 2022-08-14 ENCOUNTER — Ambulatory Visit
Admission: RE | Admit: 2022-08-14 | Discharge: 2022-08-14 | Disposition: A | Discharge status: Home / Self Care | Payer: Managed Care, Other (non HMO) | Source: Ambulatory Visit | Attending: Physical Medicine & Rehabilitation | Admitting: Physical Medicine & Rehabilitation

## 2022-08-14 VITALS — BP 135/84 | HR 67 | Ht 65.0 in | Wt 265.0 lb

## 2022-08-14 DIAGNOSIS — M5416 Radiculopathy, lumbar region: Secondary | ICD-10-CM | POA: Diagnosis not present

## 2022-08-14 DIAGNOSIS — M51369 Other intervertebral disc degeneration, lumbar region without mention of lumbar back pain or lower extremity pain: Secondary | ICD-10-CM

## 2022-08-14 DIAGNOSIS — M5136 Other intervertebral disc degeneration, lumbar region: Secondary | ICD-10-CM | POA: Diagnosis not present

## 2022-08-14 DIAGNOSIS — M1712 Unilateral primary osteoarthritis, left knee: Secondary | ICD-10-CM | POA: Diagnosis not present

## 2022-08-14 MED ORDER — PREDNISONE 20 MG PO TABS: 20.0000 mg | ORAL_TABLET | ORAL | 0 refills | Status: DC

## 2022-08-14 MED ORDER — GABAPENTIN 300 MG PO CAPS: 300.0000 mg | ORAL_CAPSULE | Freq: Three times a day (TID) | ORAL | 3 refills | Status: DC

## 2022-08-14 NOTE — Progress Notes (Signed)
Subjective:    Patient ID: Kelsey Morgan, female    DOB: 06-11-1970, 52 y.o.   MRN: [id number]  HPI  Kelsey Morgan is here in follow up of her chronic pain. About a month or so ago, she was bending over and connecting a trailor when she felt a "pop" and her back started to hurt with radiation down the left leg. She put on ice on her back. She took diclofenac, lidocaine patches, TENS unit. She's really not getting relief from anything except from ice. Pain is worst when she is driving as well as when she lies on her left. She has seen Dr. Jacelyn Grip in the past for what sounds like facet blocks and RF's   Her last MRI is from 2016, which demonstrated: L2-3: Stable disc desiccation with loss of disc height and annular disc bulging eccentric to the left. No significant spinal stenosis or nerve root encroachment.   L3-4: Disc height and hydration are maintained. There is minimal asymmetric bulging of disc material into the left foramen without focal protrusion, spinal stenosis or nerve root encroachment.   L4-5: Disc height and hydration are maintained. Mild facet and ligamentous hypertrophy. No spinal stenosis or nerve root encroachment.   L5-S1: Chronic degenerative disc disease with loss of disc height, annular disc bulging and mildly progressive endplate degeneration. Mild bilateral facet hypertrophy. Mild right-greater-than-left foraminal narrowing appears unchanged, primarily secondary to endplate spurring. The spinal canal and lateral recesses are patent.   Her left shoulder is fair. Left knee still gives her problems and she sees Dr. Rush Farmer for it.   Pain Inventory Average Pain 8 Pain Right Now 6 My pain is constant, sharp, burning, stabbing, and aching  In the last 24 hours, has pain interfered with the following? General activity 7 Relation with others 5 Enjoyment of life 5 What TIME of day is your pain at its worst? evening and night Sleep (in general) Fair  Pain is  worse with: walking, bending, sitting, inactivity, and standing Pain improves with: rest, medication, TENS, and injections Relief from Meds: 7  Family History  Problem Relation Age of Onset   Hypertension Mother    Kidney failure Mother    Heart disease Mother        Onset in her 88s   Social History   Socioeconomic History   Marital status: Single    Spouse name: Not on file   Number of children: Not on file   Years of education: Not on file   Highest education level: Not on file  Occupational History   Occupation: Truck driver  Tobacco Use   Smoking status: Every Day    Packs/day: 0.25    Years: 25.00    Total pack years: 6.25    Types: Cigarettes    Last attempt to quit: 01/01/2018    Years since quitting: 4.6   Smokeless tobacco: Never  Vaping Use   Vaping Use: Never used  Substance and Sexual Activity   Alcohol use: No   Drug use: No   Sexual activity: Not Currently  Other Topics Concern   Not on file  Social History Narrative   Lives with partner.     Social Determinants of Health   Financial Resource Strain: Not on file  Food Insecurity: Not on file  Transportation Needs: Not on file  Physical Activity: Not on file  Stress: Not on file  Social Connections: Not on file   Past Surgical History:  Procedure Laterality Date  ABDOMINAL HYSTERECTOMY     ANKLE SURGERY     CHOLECYSTECTOMY     EXCISION MASS NECK N/A 11/12/2017   Procedure: EXCISION POSTERIOR  NECK CYST;  Surgeon: Ralene Ok, MD;  Location: [place];  Service: General;  Laterality: N/A;   FOOT SURGERY     FRACTURE SURGERY     KNEE SURGERY     Past Surgical History:  Procedure Laterality Date   ABDOMINAL HYSTERECTOMY     ANKLE SURGERY     CHOLECYSTECTOMY     EXCISION MASS NECK N/A 11/12/2017   Procedure: EXCISION POSTERIOR  NECK CYST;  Surgeon: Ralene Ok, MD;  Location: [place];  Service: General;  Laterality: N/A;   FOOT SURGERY     FRACTURE SURGERY     KNEE SURGERY     Past  Medical History:  Diagnosis Date   Arthritis    Back pain    Bronchitis    CHF (congestive heart failure) (HCC)    Gout    Pneumonia    BP 135/84   Pulse 67   Ht '5\' 5"'$  (1.651 m)   Wt 265 lb (120.2 kg)   SpO2 92%   BMI 44.10 kg/m   Opioid Risk Score:   Fall Risk Score:  `1  Depression screen [facility] 2/9     07/18/2021    8:59 AM 03/10/2018    9:35 AM  Depression screen PHQ 2/9  Decreased Interest 0 0  Down, Depressed, Hopeless 0 0  PHQ - 2 Score 0 0  Altered sleeping  3  Tired, decreased energy  3  Change in appetite  1  Feeling bad or failure about yourself   0  Trouble concentrating  0  Moving slowly or fidgety/restless  0  Suicidal thoughts  0  PHQ-9 Score  7  Difficult doing work/chores  Not difficult at all     Review of Systems  Musculoskeletal:  Positive for back pain.       Bilateral leg pain  All other systems reviewed and are negative.     Objective:   Physical Exam  General: No acute distress,  obese HEENT: NCAT, EOMI, oral membranes moist Cards: reg rate  Chest: normal effort Abdomen: Soft, NT, ND Skin: dry, intact Extremities: no edema Psych: pleasant and appropriate  Neuro: Pt is cognitively appropriate with normal insight, memory, and awareness. Cranial nerves 2-12 are intact. bFine motor coordination is intact. No tremors. Motor function is grossly 5/5. Decreased sensation to left outer lower leg. DTRs 1+ Musculoskeletal:  pain along left lumbar spine at belt line. Pain increased with flexion forward and laterally to left as well as rotation to left. Extension with some discomfort also. SST+ as well as SLR to left.  Assessment & Plan:  1. Acute on chronic left knee pain. Endstage OA, ?knee repalcement 2. Left rotator cuff tendonitis--still uncomfortable but tolerable at present.  3. Myofascial pain of left trap   4. New onset low back pain with LLE radiculopathy, likely L5 based on last MRI and clinical presentation     Plan: Prednisone  taper Increase gabapentin if possible up to '300mg'$  TID, driving schedule permitting Xrays of lumbar spine at [facility]. Regular stretching 4.. Continue voltaren '75mg'$  bid for now (with food) 4. Knee per ortho    25  minutes of time was spent with the patient in discussion and review of her information, examination and creation of treatment plan.  2 mos f/u scheduled

## 2022-08-14 NOTE — Patient Instructions (Signed)
ALWAYS FEEL FREE TO CALL OUR OFFICE WITH ANY PROBLEMS OR QUESTIONS [phone])  **PLEASE NOTE** ALL MEDICATION REFILL REQUESTS (INCLUDING CONTROLLED SUBSTANCES) NEED TO BE MADE AT LEAST 7 DAYS PRIOR TO REFILL BEING DUE. ANY REFILL REQUESTS INSIDE THAT TIME FRAME MAY RESULT IN DELAYS IN RECEIVING YOUR PRESCRIPTION.                   GABAPENTIN: IF YOU CAN TAKE IT 3 X DAILY, THEN GREAT. IF NOT DUE TO YOUR SCHEDULE OR SEDATION, TAKE IT AS OFTEN AS YOU CAN UP TO 3 X DAILY.   PREDNISONE TAPER: FOR INFLAMMATION--FOLLOW DIRECTIONS ON BOTTLE  XRAY AT [place] .

## 2022-09-04 ENCOUNTER — Ambulatory Visit: Payer: Managed Care, Other (non HMO) | Admitting: Orthopaedic Surgery

## 2022-09-15 ENCOUNTER — Other Ambulatory Visit: Payer: Self-pay | Admitting: Physical Medicine & Rehabilitation

## 2022-10-09 ENCOUNTER — Encounter: Payer: Managed Care, Other (non HMO) | Admitting: Physical Medicine & Rehabilitation

## 2022-12-04 ENCOUNTER — Other Ambulatory Visit: Payer: Self-pay | Admitting: Physical Medicine & Rehabilitation

## 2022-12-04 DIAGNOSIS — M5136 Other intervertebral disc degeneration, lumbar region: Secondary | ICD-10-CM

## 2022-12-04 DIAGNOSIS — M1712 Unilateral primary osteoarthritis, left knee: Secondary | ICD-10-CM

## 2022-12-04 DIAGNOSIS — M5416 Radiculopathy, lumbar region: Secondary | ICD-10-CM

## 2022-12-11 ENCOUNTER — Telehealth: Payer: Self-pay

## 2022-12-11 NOTE — Telephone Encounter (Signed)
Patient called for a Gabapentin refill. There a refills available. Patient advised to call back to schedule a follow up appointment.   PMP Report:  Filled  Written  ID  Drug  QTY  Days  Prescriber  RX #  Dispenser  Refill  Daily Dose*  Pymt Type  PMP  11/08/2022 08/14/2022 1  Gabapentin 300 Mg Capsule 90.00 30 Za Swa [id number] [facility] ([id number]) 3/3  Comm Ins [place] 10/11/2022 08/14/2022 1  Gabapentin 300 Mg Capsule 90.00 30 Za Swa [id number] [facility] ([id number]) 2/3  Comm Ins [place]

## 2023-04-02 ENCOUNTER — Other Ambulatory Visit: Payer: Self-pay | Admitting: Physical Medicine & Rehabilitation

## 2023-04-02 DIAGNOSIS — M51369 Other intervertebral disc degeneration, lumbar region without mention of lumbar back pain or lower extremity pain: Secondary | ICD-10-CM

## 2023-04-02 DIAGNOSIS — M5416 Radiculopathy, lumbar region: Secondary | ICD-10-CM

## 2023-04-02 DIAGNOSIS — M1712 Unilateral primary osteoarthritis, left knee: Secondary | ICD-10-CM

## 2023-04-30 ENCOUNTER — Encounter: Payer: Managed Care, Other (non HMO) | Admitting: Physical Medicine & Rehabilitation

## 2023-05-21 ENCOUNTER — Encounter: Payer: Self-pay | Admitting: Physical Medicine & Rehabilitation

## 2023-05-21 ENCOUNTER — Encounter
Payer: Managed Care, Other (non HMO) | Attending: Physical Medicine & Rehabilitation | Admitting: Physical Medicine & Rehabilitation

## 2023-05-21 DIAGNOSIS — M51362 Other intervertebral disc degeneration, lumbar region with discogenic back pain and lower extremity pain: Secondary | ICD-10-CM | POA: Diagnosis not present

## 2023-05-21 DIAGNOSIS — M5416 Radiculopathy, lumbar region: Secondary | ICD-10-CM | POA: Insufficient documentation

## 2023-05-21 DIAGNOSIS — M1712 Unilateral primary osteoarthritis, left knee: Secondary | ICD-10-CM | POA: Insufficient documentation

## 2023-05-21 MED ORDER — PREDNISONE 20 MG PO TABS: 20.0000 mg | ORAL_TABLET | ORAL | 0 refills | Status: DC

## 2023-05-21 NOTE — Patient Instructions (Signed)
ALWAYS FEEL FREE TO CALL OUR OFFICE WITH ANY PROBLEMS OR QUESTIONS [phone])  **PLEASE NOTE** ALL MEDICATION REFILL REQUESTS (INCLUDING CONTROLLED SUBSTANCES) NEED TO BE MADE AT LEAST 7 DAYS PRIOR TO REFILL BEING DUE. ANY REFILL REQUESTS INSIDE THAT TIME FRAME MAY RESULT IN DELAYS IN RECEIVING YOUR PRESCRIPTION.    !!!!!!!HAPPY HOLIDAYS!!!!!!                  Good posture, stretching when you are on the road!

## 2023-05-21 NOTE — Progress Notes (Signed)
Subjective:    Patient ID: Kelsey Morgan, female    DOB: December 19, 1970, 52 y.o.   MRN: [id number]  HPI  Kelsey Morgan is here in follow-up of her chronic pain.  More specifically we have been addressing her low back and leg pain over the last few months.  Her lumbar spine x-rays show generalized spondylosis but there was loss of disc height at L5-S1 compared to prior imaging.  Her last MRI lumbar spine was from 2016 and showed some signs of the L5-S1 disc height loss and some other generalized findings.  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  Sitting for long periods of time tends to increase her pain.  We had prescribed a prednisone taper earlier this year which did provide some relief.  She remains on gabapentin 300 mg 3 times daily and really that is all she can tolerate and still be able to safely drive on the road.  She had to stop the diclofenac due to GI upset.  She does have follow-up set up for endoscopy and colonoscopy 1 year apparently.  She asked me today about injection for her low back based on her x-ray findings.  Pain Inventory Average Pain 8 Pain Right Now 10 My pain is sharp, burning, tingling, and aching  In the last 24 hours, has pain interfered with the following? General activity 7 Relation with others 0 Enjoyment of life 10 What TIME of day is your pain at its worst? evening and night Sleep (in general) Fair  Pain is worse with: walking, bending, sitting, standing, and some activites Pain improves with: heat/ice, medication, and TENS Relief from Meds: 5  Family History  Problem Relation Age of Onset   Hypertension Mother    Kidney failure Mother    Heart disease Mother        Onset in her 36s   Social History   Socioeconomic History   Marital status: Single    Spouse name: Not on file   Number of children: Not on file   Years of education: Not on file   Highest education level: Not on file  Occupational History   Occupation: Truck driver   Tobacco Use   Smoking status: Every Day    Current packs/day: 0.00    Average packs/day: 0.3 packs/day for 25.0 years (6.3 ttl pk-yrs)    Types: Cigarettes    Start date: 01/01/1993    Last attempt to quit: 01/01/2018    Years since quitting: 5.3   Smokeless tobacco: Never  Vaping Use   Vaping status: Never Used  Substance and Sexual Activity   Alcohol use: No   Drug use: No   Sexual activity: Not Currently  Other Topics Concern   Not on file  Social History Narrative   Lives with partner.     Social Drivers of Corporate investment banker Strain: Not on file  Food Insecurity: Not on file  Transportation Needs: Not on file  Physical Activity: Not on file  Stress: Not on file  Social Connections: Not on file   Past Surgical History:  Procedure Laterality Date   ABDOMINAL HYSTERECTOMY     ANKLE SURGERY     CHOLECYSTECTOMY     EXCISION MASS NECK N/A 11/12/2017   Procedure: EXCISION POSTERIOR  NECK CYST;  Surgeon: Axel Filler, MD;  Location: [facility];  Service: General;  Laterality: N/A;   FOOT SURGERY     FRACTURE SURGERY     KNEE SURGERY  Past Surgical History:  Procedure Laterality Date   ABDOMINAL HYSTERECTOMY     ANKLE SURGERY     CHOLECYSTECTOMY     EXCISION MASS NECK N/A 11/12/2017   Procedure: EXCISION POSTERIOR  NECK CYST;  Surgeon: Axel Filler, MD;  Location: [facility];  Service: General;  Laterality: N/A;   FOOT SURGERY     FRACTURE SURGERY     KNEE SURGERY     Past Medical History:  Diagnosis Date   Arthritis    Back pain    Bronchitis    CHF (congestive heart failure) (HCC)    Gout    Pneumonia    BP 137/85   Pulse 66   Ht 5\' 5"  (1.651 m)   Wt 220 lb (99.8 kg)   SpO2 98%   BMI 36.61 kg/m   Opioid Risk Score:   Fall Risk Score:  `1  Depression screen [facility] 2/9     05/21/2023    3:23 PM 07/18/2021    8:59 AM 03/10/2018    9:35 AM  Depression screen PHQ 2/9  Decreased Interest 0 0 0  Down, Depressed, Hopeless 0 0 0  PHQ - 2 Score 0 0  0  Altered sleeping   3  Tired, decreased energy   3  Change in appetite   1  Feeling bad or failure about yourself    0  Trouble concentrating   0  Moving slowly or fidgety/restless   0  Suicidal thoughts   0  PHQ-9 Score   7  Difficult doing work/chores   Not difficult at all      Review of Systems  Musculoskeletal:  Positive for back pain and gait problem.  All other systems reviewed and are negative.     Objective:   Physical Exam  General: No acute distress HEENT: NCAT, EOMI, oral membranes moist Cards: reg rate  Chest: normal effort Abdomen: Soft, NT, ND Skin: dry, intact Extremities: no edema Psych: pleasant and appropriate  Neuro: Pt is cognitively appropriate with normal insight, memory, and awareness. Cranial nerves 2-12 are intact. bFine motor coordination is intact. No tremors. Motor function is grossly 5/5. Decreased sensation to left outer lower leg remains present. DTRs 1+ Musculoskeletal: Patient with pain to palpation of the lumbar spine essentially along the belt line left more than right.  Seated slump test was positive as was straight leg raise and Lasegue's maneuver.  She does have some antalgia with weightbearing on the left.  I did not examine her left knee in depth. Assessment & Plan:  1. Acute on chronic left knee pain. Endstage OA, ?knee repalcement 2. Left rotator cuff tendonitis--still uncomfortable but tolerable at present.  3. Myofascial pain of left trap   4. New onset low back pain with LLE radiculopathy, likely L5 based on last MRI and most recent radiographical imaging       Plan: Repeat prednisone taper Maintain gabapentin  300mg  TID, driving schedule permitting Ordered MRI of the lumbar spine to further assess for disc space and nerve roots.   She stopped the Voltaren due to stomach upset 4. Knee per ortho     20+  minutes of time was spent with the patient in discussion and review of her information, examination and creation of  treatment plan.  2 mos f/u scheduled.  Potentially could refer her for an ESI sooner based on what we find.

## 2023-06-08 ENCOUNTER — Ambulatory Visit
Admission: RE | Admit: 2023-06-08 | Discharge: 2023-06-08 | Disposition: A | Discharge status: Home / Self Care | Payer: Managed Care, Other (non HMO) | Source: Ambulatory Visit | Attending: Physical Medicine & Rehabilitation | Admitting: Physical Medicine & Rehabilitation

## 2023-06-08 DIAGNOSIS — M51362 Other intervertebral disc degeneration, lumbar region with discogenic back pain and lower extremity pain: Secondary | ICD-10-CM

## 2023-06-08 DIAGNOSIS — M5416 Radiculopathy, lumbar region: Secondary | ICD-10-CM

## 2023-06-13 ENCOUNTER — Encounter: Payer: Self-pay | Admitting: Physical Medicine & Rehabilitation

## 2023-06-17 NOTE — Telephone Encounter (Signed)
 Hi April! Could you set patient up with an L5-S1 translaminar ESI with Dr. Wynn Banker. I have discussed with patient. Thanks!

## 2023-06-25 MED ORDER — METHOCARBAMOL 500 MG PO TABS: 500.0000 mg | ORAL_TABLET | Freq: Three times a day (TID) | ORAL | 2 refills | Status: DC | PRN

## 2023-06-25 NOTE — Telephone Encounter (Signed)
Robaxin sent to pharmacy. May take during the day. Takes tizanidine at night

## 2023-07-16 NOTE — Progress Notes (Unsigned)
  PROCEDURE RECORD [facility]   Name: Kelsey Morgan DOB:10/09/1970 MRN: [id number]  Date:07/16/2023  Physician: Claudette Laws, MD    Nurse/CMA: Charise Carwin MA  Allergies:  Allergies  Allergen Reactions   Hydrocodone Itching   Percocet [Oxycodone-Acetaminophen] Itching   Vicodin [Hydrocodone-Acetaminophen] Itching    Tolerates with Benadryl. Tolerates acetaminophen alone.    Consent Signed: {yes [id number]}  Is patient diabetic? {yes no:314532}  CBG today? ***  Pregnant: {yes no:314532} LMP: No LMP recorded. Patient has had a hysterectomy. (age [age])  Anticoagulants: {Yes/No:19989} Anti-inflammatory: {Yes/No:19989} Antibiotics: {Yes/No:19989}  Procedure: L5-S1 Translaminar ESI  Position: Prone Start Time: ***  End Time: ***  Fluoro Time: ***  RN/CMA Waylin Dorko MA     Time      BP      Pulse      Respirations      O2 Sat      S/S      Pain Level       D/C home with ***, patient A & O X 3, D/C instructions reviewed, and sits independently.

## 2023-07-17 ENCOUNTER — Telehealth: Payer: Self-pay | Admitting: Physical Medicine & Rehabilitation

## 2023-07-17 NOTE — Telephone Encounter (Signed)
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 Patient is requesting a call back and ok to leave a voicemail since she might not be available

## 2023-07-17 NOTE — Telephone Encounter (Signed)
Patient has been notified of discharge instructions

## 2023-07-18 ENCOUNTER — Encounter
Payer: Managed Care, Other (non HMO) | Attending: Physical Medicine & Rehabilitation | Admitting: Physical Medicine & Rehabilitation

## 2023-07-18 ENCOUNTER — Encounter: Payer: Self-pay | Admitting: Physical Medicine & Rehabilitation

## 2023-07-18 VITALS — BP 117/77 | HR 63 | Temp 97.8°F | Ht 66.0 in | Wt 224.0 lb

## 2023-07-18 DIAGNOSIS — M5416 Radiculopathy, lumbar region: Secondary | ICD-10-CM | POA: Diagnosis not present

## 2023-07-18 MED ORDER — BETAMETHASONE SOD PHOS & ACET 6 (3-3) MG/ML IJ SUSP
6.0000 mg | Freq: Once | INTRAMUSCULAR | Status: AC
  Administered 2023-07-18: 6 mg via INTRAMUSCULAR

## 2023-07-18 MED ORDER — IOHEXOL 180 MG/ML  SOLN
3.0000 mL | Freq: Once | INTRAMUSCULAR | Status: AC
  Administered 2023-07-18: 3 mL

## 2023-07-18 MED ORDER — LIDOCAINE HCL 1 % IJ SOLN
2.0000 mL | Freq: Once | INTRAMUSCULAR | Status: AC
  Administered 2023-07-18: 2 mL

## 2023-07-18 NOTE — Progress Notes (Signed)
L5-S1 Lumbar epidural steroid injection under fluoroscopic guidance  Indication: Lumbosacral radiculitis is not relieved by medication management or other conservative care and interfering with self-care and mobility.  No  anticoagulant use.  Informed consent was obtained after describing risk and benefits of the procedure with the patient, this includes bleeding, bruising, infection, paralysis and medication side effects.  The patient wishes to proceed and has given written consent.  Patient was placed in a prone position.  The lumbar area was marked and prepped with Betadine.  It was entered with a 25-gauge 1-1/2 inch needle and one mL of 1% lidocaine was injected into the skin and subcutaneous tissue.  Then a 17-gauge spinal needle was inserted under fluoroscopic guidance into the L5-S1 left paramedian interlaminar space under AP and Lateral imaging.  Once needle tip of approximated the posterior elements, a loss of resistance technique was utilized with lateral imaging.  A positive loss of resistance was obtained and then confirmed by injecting 2 mL's of Omnipaque 180.  Then a solution containing 1.5 mL's of 6mg /ml Celestone and 1.5 mL's of 1% lidocaine was injected.  The patient tolerated procedure well.  Post procedure instructions were given.  Please see post procedure form.  Omnipaque 180 2ml , 8ml waste Celestone 6mg  per ml 1.5 ml no waste Lidocaine 5ml MPF no waste

## 2023-07-23 ENCOUNTER — Encounter: Payer: Managed Care, Other (non HMO) | Admitting: Physical Medicine & Rehabilitation

## 2023-08-01 ENCOUNTER — Other Ambulatory Visit: Payer: Self-pay | Admitting: Physical Medicine & Rehabilitation

## 2023-08-01 DIAGNOSIS — M1712 Unilateral primary osteoarthritis, left knee: Secondary | ICD-10-CM

## 2023-08-01 DIAGNOSIS — M51369 Other intervertebral disc degeneration, lumbar region without mention of lumbar back pain or lower extremity pain: Secondary | ICD-10-CM

## 2023-08-01 DIAGNOSIS — M5416 Radiculopathy, lumbar region: Secondary | ICD-10-CM

## 2023-08-15 ENCOUNTER — Ambulatory Visit (HOSPITAL_BASED_OUTPATIENT_CLINIC_OR_DEPARTMENT_OTHER): Payer: Managed Care, Other (non HMO) | Admitting: Cardiology

## 2023-09-03 ENCOUNTER — Encounter: Payer: Managed Care, Other (non HMO) | Admitting: Physical Medicine & Rehabilitation

## 2023-10-08 ENCOUNTER — Encounter: Attending: Physical Medicine & Rehabilitation | Admitting: Physical Medicine & Rehabilitation

## 2023-10-08 DIAGNOSIS — M5416 Radiculopathy, lumbar region: Secondary | ICD-10-CM | POA: Insufficient documentation

## 2023-12-10 ENCOUNTER — Other Ambulatory Visit: Payer: Self-pay | Admitting: Physical Medicine & Rehabilitation

## 2023-12-10 DIAGNOSIS — M5416 Radiculopathy, lumbar region: Secondary | ICD-10-CM

## 2023-12-10 DIAGNOSIS — M1712 Unilateral primary osteoarthritis, left knee: Secondary | ICD-10-CM

## 2023-12-10 DIAGNOSIS — M51369 Other intervertebral disc degeneration, lumbar region without mention of lumbar back pain or lower extremity pain: Secondary | ICD-10-CM

## 2023-12-11 ENCOUNTER — Other Ambulatory Visit: Payer: Self-pay | Admitting: Physician Assistant

## 2023-12-11 DIAGNOSIS — M259 Joint disorder, unspecified: Secondary | ICD-10-CM

## 2023-12-29 ENCOUNTER — Other Ambulatory Visit

## 2024-01-01 ENCOUNTER — Encounter (HOSPITAL_BASED_OUTPATIENT_CLINIC_OR_DEPARTMENT_OTHER): Payer: Self-pay

## 2024-01-01 ENCOUNTER — Emergency Department (HOSPITAL_BASED_OUTPATIENT_CLINIC_OR_DEPARTMENT_OTHER)

## 2024-01-01 ENCOUNTER — Other Ambulatory Visit: Payer: Self-pay

## 2024-01-01 ENCOUNTER — Emergency Department (HOSPITAL_BASED_OUTPATIENT_CLINIC_OR_DEPARTMENT_OTHER)
Admission: EM | Admit: 2024-01-01 | Discharge: 2024-01-01 | Disposition: A | Discharge status: Home / Self Care | Source: Ambulatory Visit | Attending: Emergency Medicine | Admitting: Emergency Medicine

## 2024-01-01 ENCOUNTER — Emergency Department (HOSPITAL_COMMUNITY)

## 2024-01-01 DIAGNOSIS — I509 Heart failure, unspecified: Secondary | ICD-10-CM | POA: Diagnosis not present

## 2024-01-01 DIAGNOSIS — R200 Anesthesia of skin: Secondary | ICD-10-CM | POA: Insufficient documentation

## 2024-01-01 DIAGNOSIS — Z7951 Long term (current) use of inhaled steroids: Secondary | ICD-10-CM | POA: Insufficient documentation

## 2024-01-01 DIAGNOSIS — J40 Bronchitis, not specified as acute or chronic: Secondary | ICD-10-CM | POA: Insufficient documentation

## 2024-01-01 DIAGNOSIS — R059 Cough, unspecified: Secondary | ICD-10-CM | POA: Diagnosis present

## 2024-01-01 LAB — CBC WITH DIFFERENTIAL/PLATELET
Abs Immature Granulocytes: 0.03 K/uL (ref 0.00–0.07)
Basophils Absolute: 0 K/uL (ref 0.0–0.1)
Basophils Relative: 0 %
Eosinophils Absolute: 0.3 K/uL (ref 0.0–0.5)
Eosinophils Relative: 2 %
HCT: 42 % (ref 36.0–46.0)
Hemoglobin: 13.8 g/dL (ref 12.0–15.0)
Immature Granulocytes: 0 %
Lymphocytes Relative: 42 %
Lymphs Abs: 4.3 K/uL — ABNORMAL HIGH (ref 0.7–4.0)
MCH: 27.7 pg (ref 26.0–34.0)
MCHC: 32.9 g/dL (ref 30.0–36.0)
MCV: 84.3 fL (ref 80.0–100.0)
Monocytes Absolute: 0.8 K/uL (ref 0.1–1.0)
Monocytes Relative: 8 %
Neutro Abs: 4.9 K/uL (ref 1.7–7.7)
Neutrophils Relative %: 48 %
Platelets: 281 K/uL (ref 150–400)
RBC: 4.98 MIL/uL (ref 3.87–5.11)
RDW: 14.5 % (ref 11.5–15.5)
WBC: 10.3 K/uL (ref 4.0–10.5)
nRBC: 0 % (ref 0.0–0.2)

## 2024-01-01 LAB — COMPREHENSIVE METABOLIC PANEL WITH GFR
ALT: 12 U/L (ref 0–44)
AST: 19 U/L (ref 15–41)
Albumin: 3.7 g/dL (ref 3.5–5.0)
Alkaline Phosphatase: 83 U/L (ref 38–126)
Anion gap: 7 (ref 5–15)
BUN: 14 mg/dL (ref 6–20)
CO2: 28 mmol/L (ref 22–32)
Calcium: 9.5 mg/dL (ref 8.9–10.3)
Chloride: 106 mmol/L (ref 98–111)
Creatinine, Ser: 1.03 mg/dL — ABNORMAL HIGH (ref 0.44–1.00)
GFR, Estimated: >60 mL/min (ref >60)
Glucose, Bld: 100 mg/dL — ABNORMAL HIGH (ref 70–99)
Potassium: 3.8 mmol/L (ref 3.5–5.1)
Sodium: 141 mmol/L (ref 135–145)
Total Bilirubin: 0.3 mg/dL (ref 0.0–1.2)
Total Protein: 6.9 g/dL (ref 6.5–8.1)

## 2024-01-01 LAB — TROPONIN T, HIGH SENSITIVITY: Troponin T High Sensitivity: <15 ng/L (ref <19)

## 2024-01-01 LAB — D-DIMER, QUANTITATIVE: D-Dimer, Quant: 4.41 ug{FEU}/mL — ABNORMAL HIGH (ref 0.00–0.50)

## 2024-01-01 LAB — RESP PANEL BY RT-PCR (RSV, FLU A&B, COVID)  RVPGX2
Influenza A by PCR: NEGATIVE
Influenza B by PCR: NEGATIVE
Resp Syncytial Virus by PCR: NEGATIVE
SARS Coronavirus 2 by RT PCR: NEGATIVE

## 2024-01-01 LAB — PRO BRAIN NATRIURETIC PEPTIDE: Pro Brain Natriuretic Peptide: 108 pg/mL (ref <300.0)

## 2024-01-01 MED ORDER — PREDNISONE 20 MG PO TABS: 20.0000 mg | ORAL_TABLET | Freq: Every day | ORAL | 0 refills | Status: AC

## 2024-01-01 MED ORDER — IOHEXOL 350 MG/ML SOLN
150.0000 mL | Freq: Once | INTRAVENOUS | Status: AC | PRN
  Administered 2024-01-01: 150 mL via INTRAVENOUS

## 2024-01-01 MED ORDER — GADOBUTROL 1 MMOL/ML IV SOLN
10.0000 mL | Freq: Once | INTRAVENOUS | Status: AC | PRN
  Administered 2024-01-01: 10 mL via INTRAVENOUS

## 2024-01-01 NOTE — ED Notes (Addendum)
 Instructed pt to go to [facility] for an MRI. Pt understands to go there right away.

## 2024-01-01 NOTE — ED Provider Triage Note (Signed)
 Emergency Medicine Provider Triage Evaluation Note  Kelsey Morgan , a 53 y.o. female  was evaluated in triage.  Pt complains of needing MRI, sent from med center requiring MRI for some left-sided numbness to face and arm.  Diagnosed with pneumonia 3 weeks ago and currently taking doxycycline .  Had an elevated D-dimer with CT angio done showing no PE.  Review of Systems  Positive: N/a Negative: N/a  Physical Exam  BP 107/65   Pulse (!) 54   Temp 98.8 F (37.1 C) (Oral)   Resp 19   Ht 5' 6 (1.676 m)   Wt 102.5 kg   SpO2 99%   BMI 36.48 kg/m  Gen:   Awake, no distress   Resp:  Normal effort  MSK:   Moves extremities without difficulty  Other:    Medical Decision Making  Medically screening exam initiated at 2:51 PM.  Appropriate orders placed.  TASHAI CATINO was informed that the remainder of the evaluation will be completed by another provider, this initial triage assessment does not replace that evaluation, and the importance of remaining in the ED until their evaluation is complete.     Beola Terrall RAMAN, [place] 01/01/24 1454

## 2024-01-01 NOTE — ED Notes (Signed)
IV wrapped for transport.   

## 2024-01-01 NOTE — Discharge Instructions (Addendum)
 The testing today look good but did show that you most likely had some bronchitis and inflammation in your lungs.  Continue the antibiotics at this time.  If you start noticing any weight changes on your scale at home you can take an extra Lasix  for the next 2 to 3 days.  Make sure you are getting plenty of rest.

## 2024-01-01 NOTE — ED Notes (Signed)
 Patient transported to MRI

## 2024-01-01 NOTE — ED Triage Notes (Signed)
 Pt came in via POV from [facility] d/t recommended by [facility] clinic to be seen for a need for MRI d/t her recent numbness to the Lt side of face & arm that started 8pm last night, also reports does have congestion & taking ABT from PNE she was diagnosed with 3 weeks ago (per pt), A/Ox4.

## 2024-01-01 NOTE — ED Triage Notes (Signed)
 Sent from urgent care for eval of numbness to left side of face and left arm onset last pm. Hypotensive at clinic. Diag with pneumonia last Friday and was back at urgent care of recheck. Continued coughing and SOB. Completed steroids and taking antibiotic as directed.

## 2024-01-01 NOTE — ED Provider Notes (Signed)
 [place] EMERGENCY DEPARTMENT AT [facility] Provider Note   CSN: [id number] Arrival date & time: 01/01/24  9056     Patient presents with: Numbness and Cough   Kelsey Morgan is a 53 y.o. female.   Patient is here with ongoing cough and shortness of breath.  She is currently on antibiotics for pneumonia.  She completed a course of steroids.  She is also complaining of left-sided facial numbness left arm numbness and left arm weakness since last night over 12 hours ago.  She has a history of bronchitis CHF.  She denies any headache fever.  Less than makes it worse or better.  No neck pain.  The history is provided by the patient.       Prior to Admission medications   Medication Sig Start Date End Date Taking? Authorizing Provider  doxycycline  (DORYX ) 100 MG EC tablet Take 100 mg by mouth 2 (two) times daily.   Yes [provider]  doxycycline  (VIBRA -TABS) 100 MG tablet Take 100 mg by mouth 2 (two) times daily. 12/26/23  Yes [provider]  Accu-Chek Softclix Lancets lancets daily. 05/02/23   [provider]  albuterol  (PROVENTIL ) (2.5 MG/3ML) 0.083% nebulizer solution Take 3 mLs (2.5 mg total) by nebulization every 4 (four) hours while awake for 3 days, THEN 3 mLs (2.5 mg total) every 4 (four) hours as needed for wheezing or shortness of breath. Take your nebulizer OR your inhaler. 09/03/21 10/06/21  Briana Elgin LABOR, MD  albuterol  (VENTOLIN  HFA) 108 (90 Base) MCG/ACT inhaler Inhale 2 puffs into the lungs every 4 (four) hours while awake for 3 days, THEN 2 puffs every 4 (four) hours as needed for wheezing or shortness of breath. Take your inhaler OR your nebulizer.. 09/03/21 10/06/21  Briana Elgin LABOR, MD  benzonatate  (TESSALON ) 200 MG capsule Take 1 capsule (200 mg total) by mouth 3 (three) times daily as needed for cough. 09/03/21   Briana Elgin LABOR, MD  BISACODYL 5 MG EC tablet Take by mouth as directed. 07/01/23   [provider]  Blood Glucose  Monitoring Suppl (ONETOUCH VERIO FLEX SYSTEM) w/Device KIT SMARTSIG:injection Daily 04/29/23   [provider]  diclofenac  Sodium (VOLTAREN ) 1 % GEL Apply 1 application topically 4 (four) times daily. Bilateral knees Patient taking differently: Apply 1 application  topically 4 (four) times daily as needed (pain). Bilateral knees 09/12/20   Babs Arthea DASEN, MD  fluticasone  (FLONASE ) 50 MCG/ACT nasal spray Place 2 sprays into both nostrils daily as needed for allergies.    [provider]  furosemide  (LASIX ) 40 MG tablet Take 1 tablet (40 mg total) by mouth daily. 09/03/21 12/02/21  Briana Elgin LABOR, MD  gabapentin  (NEURONTIN ) 300 MG capsule TAKE 1 CAPSULE(300 MG) BY MOUTH THREE TIMES DAILY 12/12/23   Kirsteins, Prentice BRAVO, MD  methocarbamol  (ROBAXIN ) 500 MG tablet Take 1 tablet (500 mg total) by mouth every 8 (eight) hours as needed for muscle spasms. 06/25/23   Babs Arthea DASEN, MD  MOUNJARO 5 MG/0.5ML Pen SMARTSIG:5 Milligram(s) SUB-Q Once a Week 09/17/23   [provider]  Multiple Vitamin (MULTIVITAMIN WITH MINERALS) TABS tablet Take 1 tablet by mouth daily.    [provider]  omeprazole  (PRILOSEC) 20 MG capsule Take 20 mg by mouth daily.    [provider]  [facility] VERIO test strip SMARTSIG:Via Meter 05/01/23   [provider]  polyethylene glycol-electrolytes (NULYTELY) 420 g solution as directed. 07/01/23   [provider]  Potassium Chloride  ER  20 MEQ TBCR Take 20 mEq by mouth daily.    [provider]  potassium chloride  SA (K-DUR,KLOR-CON ) 20 MEQ tablet Take 1 tablet (20 mEq total) by mouth daily. 03/23/18   Lavona Agent, MD  predniSONE  (DELTASONE ) 20 MG tablet Take 1 tablet (20 mg total) by mouth as directed. Take 1 tab 3x daily for 4 days then 1 tab 2x daily for 4 days then 1 tab once daily for 4 days, then 1/2 tab daily for 4 days and off. Patient not taking: Reported on 07/18/2023 05/21/23   Babs Arthea DASEN, MD   rosuvastatin  (CRESTOR ) 10 MG tablet Take 1 tablet by mouth daily.    [provider]  sertraline  (ZOLOFT ) 100 MG tablet Take 100 mg by mouth at bedtime.     [provider]  tiZANidine  (ZANAFLEX ) 4 MG tablet TAKE 1 TABLET(4 MG) BY MOUTH THREE TIMES DAILY AS NEEDED FOR MUSCLE SPASMS Patient taking differently: Take 4 mg by mouth at bedtime. 10/23/20   Babs Arthea DASEN, MD  traZODone  (DESYREL ) 100 MG tablet Take 100 mg by mouth at bedtime. 09/17/23   [provider]    Allergies: Hydrocodone , Percocet [oxycodone -acetaminophen ], and Vicodin [hydrocodone -acetaminophen ]    Review of Systems  Updated Vital Signs BP 115/66   Pulse (!) 51   Temp 98.8 F (37.1 C) (Oral)   Resp 19   Ht 5' 6 (1.676 m)   Wt 102.5 kg   SpO2 98%   BMI 36.48 kg/m   Physical Exam Vitals and nursing note reviewed.  Constitutional:      General: She is not in acute distress.    Appearance: She is well-developed. She is not ill-appearing.  HENT:     Head: Normocephalic and atraumatic.     Nose: Nose normal.     Mouth/Throat:     Mouth: Mucous membranes are moist.  Eyes:     Extraocular Movements: Extraocular movements intact.     Conjunctiva/sclera: Conjunctivae normal.     Pupils: Pupils are equal, round, and reactive to light.  Cardiovascular:     Rate and Rhythm: Normal rate and regular rhythm.     Pulses: Normal pulses.     Heart sounds: Normal heart sounds. No murmur heard. Pulmonary:     Effort: Pulmonary effort is normal. No respiratory distress.     Breath sounds: Normal breath sounds.  Abdominal:     Palpations: Abdomen is soft.     Tenderness: There is no abdominal tenderness.  Musculoskeletal:        General: No swelling. Normal range of motion.     Cervical back: Normal range of motion and neck supple.  Skin:    General: Skin is warm and dry.     Capillary Refill: Capillary refill takes less than 2 seconds.  Neurological:     Mental Status: She is alert.      Comments: Numbness over the left side of her face and left arm normal sensation elsewhere, slightly decreased strength in the left upper arm compared to the right upper arm, 5+ out of 5 strength in the lower extremities, normal coordination normal speech normal visual fields  Psychiatric:        Mood and Affect: Mood normal.     (all labs ordered are listed, but only abnormal results are displayed) Labs Reviewed  CBC WITH DIFFERENTIAL/PLATELET - Abnormal; Notable for the following components:      Result Value   Lymphs Abs 4.3 (*)    All other  components within normal limits  COMPREHENSIVE METABOLIC PANEL WITH GFR - Abnormal; Notable for the following components:   Glucose, Bld 100 (*)    Creatinine, Ser 1.03 (*)    All other components within normal limits  D-DIMER, QUANTITATIVE - Abnormal; Notable for the following components:   D-Dimer, Quant 4.41 (*)    All other components within normal limits  RESP PANEL BY RT-PCR (RSV, FLU A&B, COVID)  RVPGX2  PRO BRAIN NATRIURETIC PEPTIDE  TROPONIN T, HIGH SENSITIVITY    EKG: EKG Interpretation Date/Time:  Thursday January 01 2024 09:53:53 EDT Ventricular Rate:  56 PR Interval:  170 QRS Duration:  79 QT Interval:  454 QTC Calculation: 439 R Axis:   38  Text Interpretation: Sinus rhythm Low voltage, precordial leads Probable anteroseptal infarct, old Confirmed by Ruthe Cornet [phone]) on 01/01/2024 9:56:46 AM  Radiology: CT ANGIO HEAD NECK W WO CM Result Date: 01/01/2024 CLINICAL DATA:  Neuro deficit, acute, stroke suspected. Numbness of left side of face and arm. EXAM: CT ANGIOGRAPHY HEAD AND NECK WITH AND WITHOUT CONTRAST TECHNIQUE: Multidetector CT imaging of the head and neck was performed using the standard protocol during bolus administration of intravenous contrast. Multiplanar CT image reconstructions and MIPs were obtained to evaluate the vascular anatomy. Carotid stenosis measurements (when applicable) are obtained utilizing  NASCET criteria, using the distal internal carotid diameter as the denominator. RADIATION DOSE REDUCTION: This exam was performed according to the departmental dose-optimization program which includes automated exposure control, adjustment of the mA and/or kV according to patient size and/or use of iterative reconstruction technique. CONTRAST:  OMNIPAQUE  IOHEXOL  350 MG/ML SOLN COMPARISON:  CT of the head dated December 25, 2015. FINDINGS: CT HEAD FINDINGS Brain: Normal brain. No evidence of hemorrhage, mass, cortical infarct or hydrocephalus. Vascular: Negative. Skull: Intact and unremarkable. Sinuses/Orbits: Clear paranasal sinuses and mastoid air cells. Normal orbits. Other: None. Review of the MIP images confirms the above findings CTA NECK FINDINGS Aortic arch: The arch is normal in caliber. The left vertebral artery arises directly from the arch. The brachiocephalic artery is normal in caliber. Right carotid system: The common carotid and internal carotid arteries are normal in caliber and unremarkable in appearance throughout the neck. Left carotid system: The common carotid and internal carotid arteries are normal in caliber and unremarkable throughout the neck. Vertebral arteries: The vertebral arteries are codominant and normal in caliber throughout the respective courses. The left vertebral artery arises directly from the aortic arch. Skeleton: Mild degenerative changes within the cervical spine. No osseous lesions. Other neck: The soft tissues of the neck are unremarkable. Upper chest: Mild emphysema. Review of the MIP images confirms the above findings CTA HEAD FINDINGS Anterior circulation: No significant stenosis, proximal occlusion, aneurysm, or vascular malformation. Posterior circulation: No significant stenosis, proximal occlusion, aneurysm, or vascular malformation. There is a complete circle-of-Willis. Venous sinuses: Patent. Anatomic variants: None. Review of the MIP images confirms the above  findings IMPRESSION: 1. Essentially negative CT angiogram of the head and neck. The left vertebral artery arises directly from the aortic arch, normal variant. Electronically Signed   By: Evalene Coho M.D.   On: 01/01/2024 13:27   CT Angio Chest PE W and/or Wo Contrast Result Date: 01/01/2024 CLINICAL DATA:  Persistent cough and shortness of breath. Hypotension. EXAM: CT ANGIOGRAPHY CHEST WITH CONTRAST TECHNIQUE: Multidetector CT imaging of the chest was performed using the standard protocol during bolus administration of intravenous contrast. Multiplanar CT image reconstructions and MIPs were obtained to evaluate the  vascular anatomy. RADIATION DOSE REDUCTION: This exam was performed according to the departmental dose-optimization program which includes automated exposure control, adjustment of the mA and/or kV according to patient size and/or use of iterative reconstruction technique. CONTRAST:  OMNIPAQUE  IOHEXOL  350 MG/ML SOLN COMPARISON:  Same day chest radiograph, CTA chest dated 09/01/2021 FINDINGS: Cardiovascular: The study is adequate for the evaluation of pulmonary embolism. There are no filling defects in the central, lobar, segmental or subsegmental pulmonary artery branches to suggest acute pulmonary embolism. Dilated main pulmonary artery measures 3.3 cm. Normal heart size. No significant pericardial fluid/thickening. Mediastinum/Nodes: Imaged thyroid gland without nodules meeting criteria for imaging follow-up by size. Normal esophagus. Unchanged mediastinal and hilar lymphadenopathy, for example 10 mm prevascular lymph node (4:31) and 13 mm right hilar (4:48). Lungs/Pleura: The central airways are patent. Mild diffuse bronchial wall thickening. Mild interlobular septal thickening. Mild dependent ground-glass opacities along the fissures and mosaic attenuation involving the bilateral lower lobes. Unchanged peripheral left lower lobe 7 x 7 mm nodule (6:95), likely benign given 2 year  stability. No specific follow-up imaging recommended. No pneumothorax. No pleural effusion. Upper abdomen: Cholecystectomy.  New 2 Musculoskeletal: No acute or abnormal lytic or blastic osseous lesions. Multilevel degenerative changes of the thoracic spine. Review of the MIP images confirms the above findings. IMPRESSION: 1. No evidence of acute pulmonary embolism. 2. Mild diffuse bronchial wall thickening and interlobular septal thickening, which can be seen in the setting of pulmonary edema. 3. Mosaic attenuation involving the bilateral lower lobes, which may reflect superimposed small airways disease. 4. Unchanged mediastinal and hilar lymphadenopathy, likely reactive. 5. Dilated main pulmonary artery, which can be seen in the setting of pulmonary arterial hypertension. Electronically Signed   By: Limin  Xu M.D.   On: 01/01/2024 13:22   DG Chest Portable 1 View Result Date: 01/01/2024 CLINICAL DATA:  Cough. EXAM: PORTABLE CHEST 1 VIEW COMPARISON:  Chest radiograph dated 09/01/2021. FINDINGS: Mild cardiomegaly with mild vascular congestion. No focal consolidation or pneumothorax. No large pleural effusion. No acute osseous pathology. IMPRESSION: Mild cardiomegaly with mild vascular congestion. Electronically Signed   By: Vanetta Chou M.D.   On: 01/01/2024 10:29     Procedures   Medications Ordered in the ED  iohexol  (OMNIPAQUE ) 350 MG/ML injection 150 mL (150 mLs Intravenous Contrast Given 01/01/24 1306)                                    Medical Decision Making Amount and/or Complexity of Data Reviewed Labs: ordered. Radiology: ordered.  Risk Prescription drug management.   QIANNA CLAGETT is here with cough and shortness of breath as well as numbness and weakness.  Normal vitals.  No fever.  With Fahr's cough shortness of breath will evaluate for pneumonia CHF ACS PE.  As far as numbness and weakness we will get a CTA of the head and neck to evaluate for intracranial process.  Will  likely need to send her for MRI to rule out stroke.  She does have some trace weakness in the left upper arm.  She does endorse numbness over the left side of her face and arm.  This was over 12 hours ago.  Not sure if this is effort dependent arm weakness or not.  She has got unremarkable vitals.  EKG shows sinus rhythm.  No ischemic changes.  Will get chest x-ray labs reevaluate.  Overall lab work shows no significant  leukocytosis anemia or electrolyte abnormality.  Troponin unremarkable.  BNP unremarkable.  Viral panel negative.  No significant findings on lab except for elevated D-dimer.  CTA of the head and neck and CT PE scan were obtained to further evaluate for stroke and pulmonary causes of her symptoms.  Ultimately CTA was unremarkable.  No findings to suggest stroke or large vessel occlusion.  Chest CT showed no evidence of pulmonary embolism.  Ultimately showed small airway disease/bronchitis type changes.  I do not think this is edema given that her BNP is unremarkable.  She still has a couple days left of antibiotics.  Overall I do think her respiratory symptoms are likely resolving pneumonia/bronchitis.  Continue the antibiotics.  Will extend her steroids out a couple more days.  Ultimately however I think she should get an MRI to make sure that her numbness and possible weakness in her left arm forearm is not stroke related.  Seems like strength is improved but still has some numbness.  She prefers to go POV for MRI.  She understands to go directly to [facility] to get this done.  I talked with Dr. Ginger who is aware of transfer.  This chart was dictated using voice recognition software.  Despite best efforts to proofread,  errors can occur which can change the documentation meaning.      Final diagnoses:  Numbness  Bronchitis    ED Discharge Orders     None          Ruthe Cornet, DO 01/01/24 1338

## 2024-01-01 NOTE — ED Triage Notes (Signed)
 POV/ ambulatory/ sent from [facility] ED for MRI

## 2024-01-01 NOTE — ED Provider Notes (Signed)
 Patient presents from [facility] due to ongoing numbness in the left face and arm.  CTA of the head was negative for acute findings.  Here for MRI.  Patient also reports she has been treated for pneumonia and CT of the chest shows concern for possible infectious process.  Patient reports she has lost weight and she is compliant with her diuretics and BNP was normal feeling that CHF is less likely.  Vital signs today are stable. MRI today with no acute abnormalities.  Mild chronic microvascular ischemic changes.  This was discussed with the patient.  At this time she will be discharged home.  Did discuss with her increasing her Lasix  if there is any weight changes.   Doretha Folks, MD 01/01/24 2026

## 2024-01-05 ENCOUNTER — Other Ambulatory Visit

## 2024-01-12 ENCOUNTER — Ambulatory Visit
Admission: RE | Admit: 2024-01-12 | Discharge: 2024-01-12 | Disposition: A | Discharge status: Home / Self Care | Source: Ambulatory Visit | Attending: Physician Assistant | Admitting: Physician Assistant

## 2024-01-12 DIAGNOSIS — M259 Joint disorder, unspecified: Secondary | ICD-10-CM

## 2024-01-12 MED ORDER — IOPAMIDOL (ISOVUE-M 200) INJECTION 41%
10.0000 mL | Freq: Once | INTRAMUSCULAR | Status: AC | PRN
  Administered 2024-01-12 (×2): 1 mL via INTRA_ARTICULAR

## 2024-02-25 ENCOUNTER — Ambulatory Visit (HOSPITAL_BASED_OUTPATIENT_CLINIC_OR_DEPARTMENT_OTHER): Admitting: Cardiology

## 2024-03-14 NOTE — Progress Notes (Signed)
 Cardiology Office Note:   Date:  03/14/2024  ID:  Kelsey Morgan, DOB 02-20-1971, MRN [id number] PCP: Sun, Vyvyan, MD  [facility] Providers Cardiologist:  Georganna Archer, MD{ Chief Complaint:  Chief Complaint  Patient presents with   Congestive Heart Failure      History of Present Illness:   Kelsey Morgan is a 53 y.o. female with a PMH of HFpEF (EF = 60-65%), HLD, COPD, tobacco use disorder, prediabetes, morbid obesity and gout who presents as a new patient referral by Dr. Vyvyan Sun for the evaluation of HFpEF.  The patient was previously seen by Dr. Lavona in 2021 for SOB and was instructed to follow-up as needed.  She is followed by [facility] primary care and it was felt important that she reestablish care with cardiology given her diagnosis of HFpEF.  Her last hospitalization was in 2023 for acute on chronic HFpEF exacerbation.  Recently the patient had an ED visit for numbness in her face and arm.  An MRI brain was performed which was negative for any acute CVA, but did show chronic microvascular changes.  A CT PE was performed which showed mild diffuse bronchial wall thickening and interlobar septal thickening possibly related to pulmonary edema and a dilated main PA.  BNP and troponins were WNL. Labs performed by her PCP in April showed LDL = 78, triglycerides 96, negative microalbumin, A1c 5.8%.  Today the patient states that she feels well and has had no recurrence of that numb feeling.  She takes her Lasix  once daily as prescribed and says that her swelling is under control.  Denies PND, orthopnea, chest pain, SOB, DOE, palpitations, syncope or presyncope.  Her family history is notable for multiple family members that had CHF, so this diagnosis concerns her.  She endorses smoking 1/2 ppd since she was 39 and is interested in quitting.  Denies illicit drug use and EtOH use.  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  No other concerns.   Past Medical History:  Diagnosis Date   Arthritis    Back pain    Bronchitis    CHF (congestive heart failure) (HCC)    Gout    Pneumonia      Studies Reviewed:    EKG:  EKG Interpretation Date/Time:  Monday March 15 2024 08:35:13 EDT Ventricular Rate:  55 PR Interval:  174 QRS Duration:  82 QT Interval:  428 QTC Calculation: 409 R Axis:   2  Text Interpretation: Sinus bradycardia with sinus arrhythmia Low voltage, precordial leads When compared with ECG of 01-Jan-2024 09:53, PREVIOUS ECG IS PRESENT Confirmed by Archer Georganna [phone]) on 03/15/2024 8:44:02 AM     Cardiac Studies & Procedures   ______________________________________________________________________________________________   STRESS TESTS  MYOCARDIAL PERFUSION IMAGING 10/31/2016  Interpretation Summary  The left ventricular ejection fraction is normal (55-65%).  Nuclear stress EF: 64%.  No T wave inversion was noted during stress.  There was no ST segment deviation noted during stress.  This is a low risk study.  No reversible ischemia. LVEF 64% with normal wall motion. This is a low risk study.   ECHOCARDIOGRAM  ECHOCARDIOGRAM COMPLETE 09/02/2021  Narrative ECHOCARDIOGRAM REPORT    Patient Name:   Kelsey Morgan Date of Exam: 09/02/2021 Medical Rec #:  [id number]  Height:       66.5 in Accession #:    [id number]       Weight:       271.1 lb Date of Birth:  1970-09-25       BSA:          2.289 m Patient Age:    50 years         BP:           126/69 mmHg Patient Gender: F                HR:           57 bpm. Exam Location:  Inpatient  Procedure: 2D Echo, Cardiac Doppler and Color Doppler  Indications:    CHF  History:        Patient has prior history of Echocardiogram examinations. Risk Factors:Current Smoker.  Sonographer:    Waddell Captain Referring Phys:  [phone] RALPH A NETTEY  IMPRESSIONS   1. Left ventricular ejection fraction, by estimation, is 60 to 65%. The left ventricle has normal function. The left ventricle has no regional wall motion abnormalities. Left ventricular diastolic parameters are consistent with Grade I diastolic dysfunction (impaired relaxation). 2. Right ventricular systolic function is normal. The right ventricular size is normal. 3. The mitral valve is normal in structure. No evidence of mitral valve regurgitation. No evidence of mitral stenosis. 4. The aortic valve is tricuspid. Aortic valve regurgitation is not visualized. No aortic stenosis is present. 5. The inferior vena cava is normal in size with greater than 50% respiratory variability, suggesting right atrial pressure of 3 mmHg.  FINDINGS Left Ventricle: Left ventricular ejection fraction, by estimation, is 60 to 65%. The left ventricle has normal function. The left ventricle has no regional wall motion abnormalities. The left ventricular internal cavity size was normal in size. There is no left ventricular hypertrophy. Left ventricular diastolic parameters are consistent with Grade I diastolic dysfunction (impaired relaxation).  Right Ventricle: The right ventricular size is normal. Right ventricular systolic function is normal.  Left Atrium: Left atrial size was normal in size.  Right Atrium: Right atrial size was normal in size.  Pericardium: There is no evidence of pericardial effusion.  Mitral Valve: The mitral valve is normal in structure. No evidence of mitral valve regurgitation. No evidence of mitral valve stenosis.  Tricuspid Valve: The tricuspid valve is normal in structure. Tricuspid valve regurgitation is trivial. No evidence of tricuspid stenosis.  Aortic Valve: The aortic valve is tricuspid. Aortic valve regurgitation is not visualized. No aortic stenosis is present. Aortic valve peak gradient measures 13.0 mmHg.  Pulmonic Valve: The pulmonic  valve was normal in structure. Pulmonic valve regurgitation is not visualized. No evidence of pulmonic stenosis.  Aorta: The aortic root is normal in size and structure.  Venous: The inferior vena cava is normal in size with greater than 50% respiratory variability, suggesting right atrial pressure of 3 mmHg.  IAS/Shunts: No atrial level shunt detected by color flow Doppler.   LEFT VENTRICLE PLAX 2D LVIDd:         5.00 cm      Diastology LVIDs:         3.00 cm      LV e' medial:    6.53 cm/s LV PW:         1.10 cm      LV E/e' medial:  12.7 LV IVS:        1.00 cm  LV e' lateral:   7.83 cm/s LVOT diam:     2.00 cm      LV E/e' lateral: 10.6 LV SV:         93 LV SV Index:   41 LVOT Area:     3.14 cm  LV Volumes (MOD) LV vol d, MOD A2C: 114.0 ml LV vol d, MOD A4C: 114.0 ml LV vol s, MOD A2C: 36.4 ml LV vol s, MOD A4C: 36.3 ml LV SV MOD A2C:     77.6 ml LV SV MOD A4C:     114.0 ml LV SV MOD BP:      80.1 ml  RIGHT VENTRICLE             IVC RV Basal diam:  3.30 cm     IVC diam: 2.10 cm RV Mid diam:    2.60 cm RV S prime:     17.90 cm/s TAPSE (M-mode): 3.2 cm  LEFT ATRIUM             Index        RIGHT ATRIUM           Index LA diam:        3.70 cm 1.62 cm/m   RA Area:     14.60 cm LA Vol (A2C):   34.9 ml 15.25 ml/m  RA Volume:   36.70 ml  16.04 ml/m LA Vol (A4C):   44.0 ml 19.23 ml/m LA Biplane Vol: 39.8 ml 17.39 ml/m AORTIC VALVE AV Area (Vmax): 2.72 cm AV Vmax:        180.00 cm/s AV Peak Grad:   13.0 mmHg LVOT Vmax:      156.00 cm/s LVOT Vmean:     98.300 cm/s LVOT VTI:       0.296 m  AORTA Ao Root diam: 2.80 cm Ao Asc diam:  2.90 cm  MITRAL VALVE MV Area (PHT): 2.77 cm    SHUNTS MV Decel Time: 274 msec    Systemic VTI:  0.30 m MV E velocity: 83.20 cm/s  Systemic Diam: 2.00 cm MV A velocity: 90.70 cm/s MV E/A ratio:  0.92  Redell Shallow MD Electronically signed by Redell Shallow MD Signature Date/Time: 09/02/2021/12:29:29 PM    Final           ______________________________________________________________________________________________      Risk Assessment/Calculations:              Physical Exam:     VS:  BP 122/74 (BP Location: Left Arm, Patient Position: Sitting, Cuff Size: Large)   Pulse (!) 58   Ht 5' 6.5 (1.689 m)   Wt 229 lb 3.2 oz (104 kg)   SpO2 98%   BMI 36.44 kg/m      Wt Readings from Last 3 Encounters:  01/01/24 226 lb (102.5 kg)  07/18/23 224 lb (101.6 kg)  05/21/23 220 lb (99.8 kg)     GEN: Well nourished, well developed, in no acute distress NECK: No JVD; No carotid bruits CARDIAC: RRR, no murmurs, rubs, gallops RESPIRATORY:  Clear to auscultation without rales, wheezing or rhonchi  ABDOMEN: Soft, non-tender, non-distended, normal bowel sounds EXTREMITIES:  Warm and well perfused, no edema; No deformity, 2+ radial pulses PSYCH: Normal mood and affect   Assessment & Plan Chronic heart failure with preserved ejection fraction (HFpEF) (HCC) Patient with prior hospitalizations for CHF.  Grade 1 diastolic dysfunction on her last echocardiogram.  She is well compensated on physical exam today she is asymptomatic.  Says she  has been hospitalized multiple times in the past for CHF, I think that she may benefit from an SGLT2 inhibitor to reduce HFpEF hospitalizations.  We will send to pharmacy to make sure that it is affordable for her.  I discussed the risk and benefits of this medication with the patient and she is agreeable to try. -Start Jardiance 10 mg daily -Continue Lasix   Mixed hyperlipidemia Her brain MRI showed chronic microvascular changes consistent with ischemia despite showing acute cerebral infarct.  I think that it is imperative that her LDL be less than 70 given this information.  Last LDL was 78.  I will increase her Crestor  to 20 mg daily.  She can have a lipid panel checked in 1 year. -increase crestor  20 - Repeat lipids annually  Tobacco use The patient smokes half a pack a  day but is interested in quitting.  She has tried patches in the past but it causes skin irritation.  She has tried Barbarann Sage in the past but it caused mood issues.  She says that she smokes only when she tries because it  gives her something to do.  With this in mind, I will prescribe Nicorette gum to help with both her cravings and perhaps the activity of chewing gum will take her mind off wanting to smoke.  She is agreeable to try. -Smoking cessation counseling given - Start Nicorette gum          This note was written with the assistance of a dictation microphone or AI dictation software. Please excuse any typos or grammatical errors.   Signed, Georganna Archer, MD 03/14/2024 8:41 AM    [facility]

## 2024-03-15 ENCOUNTER — Other Ambulatory Visit (HOSPITAL_COMMUNITY): Payer: Self-pay

## 2024-03-15 ENCOUNTER — Ambulatory Visit
Attending: Student in an Organized Health Care Education/Training Program | Admitting: Student in an Organized Health Care Education/Training Program

## 2024-03-15 ENCOUNTER — Encounter: Payer: Self-pay | Admitting: Student in an Organized Health Care Education/Training Program

## 2024-03-15 VITALS — BP 122/74 | HR 58 | Ht 66.5 in | Wt 229.2 lb

## 2024-03-15 DIAGNOSIS — Z720 Tobacco use: Secondary | ICD-10-CM | POA: Diagnosis not present

## 2024-03-15 DIAGNOSIS — I5032 Chronic diastolic (congestive) heart failure: Secondary | ICD-10-CM | POA: Diagnosis not present

## 2024-03-15 DIAGNOSIS — E782 Mixed hyperlipidemia: Secondary | ICD-10-CM | POA: Diagnosis not present

## 2024-03-15 MED ORDER — ROSUVASTATIN CALCIUM 20 MG PO TABS: 20.0000 mg | ORAL_TABLET | Freq: Every day | ORAL | 3 refills | Status: AC

## 2024-03-15 MED ORDER — EMPAGLIFLOZIN 10 MG PO TABS: 10.0000 mg | ORAL_TABLET | Freq: Every day | ORAL | 3 refills | Status: AC

## 2024-03-15 MED ORDER — NICOTINE POLACRILEX 4 MG MT GUM: 4.0000 mg | CHEWING_GUM | OROMUCOSAL | 0 refills | Status: DC | PRN

## 2024-03-15 NOTE — Patient Instructions (Addendum)
 Medication Instructions:  Your physician has recommended you make the following change in your medication:  1) START taking Jardiance 10 mg once daily  2) INCREASE Crestor  (rosuvastatin ) to 20 mg once daily  3) START using Nicorette gum as needed  *If you need a refill on your cardiac medications before your next appointment, please call your pharmacy*  Follow-Up: At [facility], you and your health needs are our priority.  As part of our continuing mission to provide you with exceptional heart care, our providers are all part of one team.  This team includes your primary Cardiologist (physician) and Advanced Practice Providers or APPs (Physician Assistants and Nurse Practitioners) who all work together to provide you with the care you need, when you need it.  Your next appointment:   1 year  Provider:   Dr. Floretta

## 2024-03-15 NOTE — Assessment & Plan Note (Signed)
 The patient smokes half a pack a day but is interested in quitting.  She has tried patches in the past but it causes skin irritation.  She has tried Barbarann Sage in the past but it caused mood issues.  She says that she smokes only when she tries because it  gives her something to do.  With this in mind, I will prescribe Nicorette gum to help with both her cravings and perhaps the activity of chewing gum will take her mind off wanting to smoke.  She is agreeable to try. -Smoking cessation counseling given - Start Nicorette gum

## 2024-03-15 NOTE — Assessment & Plan Note (Signed)
 Patient with prior hospitalizations for CHF.  Grade 1 diastolic dysfunction on her last echocardiogram.  She is well compensated on physical exam today she is asymptomatic.  Says she has been hospitalized multiple times in the past for CHF, I think that she may benefit from an SGLT2 inhibitor to reduce HFpEF hospitalizations.  We will send to pharmacy to make sure that it is affordable for her.  I discussed the risk and benefits of this medication with the patient and she is agreeable to try. -Start Jardiance 10 mg daily -Continue Lasix 

## 2024-03-16 ENCOUNTER — Telehealth: Payer: Self-pay | Admitting: Pharmacy Technician

## 2024-03-16 ENCOUNTER — Other Ambulatory Visit (HOSPITAL_COMMUNITY): Payer: Self-pay

## 2024-03-16 NOTE — Telephone Encounter (Signed)
 Pharmacy Patient Advocate Encounter  Received notification from [employer] that Prior Authorization for jardiance has been APPROVED from 03/16/24 to 03/16/25   PA #/Case ID/Reference #: [id number]

## 2024-03-16 NOTE — Telephone Encounter (Signed)
   Pharmacy Patient Advocate Encounter   Received notification from Onbase that prior authorization for jardiance is required/requested.   Insurance verification completed.   The patient is insured through [employer].   Per test claim: PA required; PA submitted to above mentioned insurance via Latent Key/confirmation #/EOC [id number] Status is pending

## 2024-04-08 ENCOUNTER — Other Ambulatory Visit: Payer: Self-pay | Admitting: Physical Medicine & Rehabilitation

## 2024-04-08 DIAGNOSIS — M1712 Unilateral primary osteoarthritis, left knee: Secondary | ICD-10-CM

## 2024-04-08 DIAGNOSIS — M51369 Other intervertebral disc degeneration, lumbar region without mention of lumbar back pain or lower extremity pain: Secondary | ICD-10-CM

## 2024-04-08 DIAGNOSIS — M5416 Radiculopathy, lumbar region: Secondary | ICD-10-CM

## 2024-04-23 ENCOUNTER — Ambulatory Visit (HOSPITAL_COMMUNITY): Payer: Self-pay | Admitting: Physician Assistant

## 2024-04-27 ENCOUNTER — Telehealth: Payer: Self-pay | Admitting: Student in an Organized Health Care Education/Training Program

## 2024-04-28 ENCOUNTER — Telehealth (HOSPITAL_BASED_OUTPATIENT_CLINIC_OR_DEPARTMENT_OTHER): Payer: Self-pay

## 2024-04-28 NOTE — Telephone Encounter (Signed)
   Pre-operative Risk Assessment    Patient Name: Kelsey Morgan  DOB: 09-20-1970 MRN: [id number]   Date of last office visit: 03/15/2024 with Dr. Floretta  Date of next office visit: None  Request for Surgical Clearance    Procedure:  PSFI, ALIF L5-S1  Date of Surgery:  Clearance TBD                                 Surgeon:  Not specified Surgeon's Group or Practice Name:  [facility]  Phone number:  [phone] Fax number:  [phone]   Type of Clearance Requested:   - Medical  - Pharmacy:  Hold Aspirin  -Does not specify   Type of Anesthesia:  Does not specify   Additional requests/questions:  None  Signed, Patrcia Iverson CROME   04/28/2024, 12:15 PM

## 2024-04-28 NOTE — Telephone Encounter (Signed)
 HI Dr. Floretta You saw this patient on 03/15/24  who now needs back surgery. Does she need additional testing prior to surgery or can she proceed?

## 2024-05-03 NOTE — Telephone Encounter (Signed)
   Patient Name: Kelsey Morgan  DOB: 1971/01/21 MRN: [id number]  Primary Cardiologist: Lynwood Schilling, MD  Chart reviewed as part of pre-operative protocol coverage. Given past medical history and time since last visit, based on ACC/AHA guidelines, Kelsey Morgan is at acceptable risk for the planned procedure without further cardiovascular testing.   Regarding ASA therapy, we recommend continuation of ASA throughout the perioperative period.  However, if the surgeon feels that cessation of ASA is required in the perioperative period, it may be stopped 5-7 days prior to surgery with a plan to resume it as soon as felt to be feasible from a surgical standpoint in the post-operative period.   The patient was advised that if she develops new symptoms prior to surgery to contact our office to arrange for a follow-up visit, and she verbalized understanding.  I will route this recommendation to the requesting party via Epic fax function and remove from pre-op pool.  Please call with questions.  Lamarr Satterfield, NP 05/03/2024, 9:06 AM

## 2024-05-03 NOTE — H&P (View-Only) (Signed)
 [facility] [place]  ASSESSMENT / PLAN: Kelsey Morgan is a 53 y.o. female with plans to undergo L5/S1 anterior lumbar interbody fusion on 05/20/24 with Dr. Burnetta. I have reviewed the patient's imaging and feel it is safe to approach this anteriorly. I discussed the specific risks associated with spine exposure including vascular injury, urethral injury, bowel injury, nervous injury. I explained that if severe vascular injury was encountered we may need to abandon the spinal procedure to safely repair the injury. I explained the typical postoperative course for anterior exposure of the spine.The patient was understanding and wished to proceed.   CHIEF COMPLAINT: back pain  HISTORY OF PRESENT ILLNESS: Kelsey Morgan is a 53 y.o. female referred to clinic for evaluation of possible ALIF for back pain with radiculopathy.  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  The bulk of our visit was spent reviewing the rationale for vascular surgery involvement of the anterior exposure of the L5/S1 disc base.  Reviewed the common risks involved with this exposure.  Past Medical History:  Diagnosis Date   Arthritis    Back pain    Bronchitis    CHF (congestive heart failure) (HCC)    Chronic back pain    Gout    High cholesterol    Joint pain    Pneumonia    Sleep apnea     Past Surgical History:  Procedure Laterality Date   ABDOMINAL HYSTERECTOMY     ANKLE SURGERY     CHOLECYSTECTOMY     EXCISION MASS NECK N/A 11/12/2017   Procedure: EXCISION POSTERIOR  NECK CYST;  Surgeon: Rubin Calamity, MD;  Location: [facility] OR;  Service: General;  Laterality: N/A;   FOOT SURGERY     FRACTURE SURGERY     KNEE ARTHROSCOPY N/A    KNEE SURGERY     ORIF ANKLE FRACTURE      Family History  Problem Relation Age of Onset   Arthritis Mother    Hypertension Mother    Kidney failure Mother    Heart disease Mother        Onset in her 21s    Arthritis Father    Hypertension Sister    Diabetes Sister     Social History   Socioeconomic History   Marital status: Media Planner    Spouse name: Not on file   Number of children: Not on file   Years of education: Not on file   Highest education level: Not on file  Occupational History   Occupation: Truck driver  Tobacco Use   Smoking status: Every Day    Current packs/day: 0.00    Average packs/day: 0.3 packs/day for 25.0 years (6.3 ttl pk-yrs)    Types: Cigarettes    Start date: 01/01/1993    Last attempt to quit: 01/01/2018    Years since quitting: 6.3   Smokeless tobacco: Never   Tobacco comments:    03/15/2024 Patient smokes 15 cigarettes daily  Vaping Use   Vaping status: Never Used  Substance and Sexual Activity   Alcohol use: No   Drug use: No   Sexual activity: Not Currently  Other Topics Concern   Not on file  Social History Narrative   Lives with partner.     Social Drivers of Corporate Investment Banker Strain: Not on file  Food Insecurity: Not on file  Transportation Needs: Not on file  Physical Activity: Not on file  Stress: Not on file  Social Connections: Not on file  Intimate Partner Violence: Not on file    Allergies  Allergen Reactions   Atorvastatin      Other Reaction(s): Not available   Atorvastatin  Calcium      Other Reaction(s): cramps   Simvastatin     Other Reaction(s): cramps, Not available   Hydrocodone  Itching   Percocet [Oxycodone -Acetaminophen ] Itching   Vicodin [Hydrocodone -Acetaminophen ] Itching    Tolerates with Benadryl . Tolerates acetaminophen  alone.    Current Outpatient Medications  Medication Sig Dispense Refill   Accu-Chek Softclix Lancets lancets daily.     AIRSUPRA 90-80 MCG/ACT AERO Inhale 2 puffs into the lungs daily as needed. (Patient not taking: Reported on 03/15/2024)     albuterol  (PROVENTIL ) (2.5 MG/3ML) 0.083% nebulizer solution Take 3 mLs (2.5 mg total) by nebulization every 4 (four) hours while  awake for 3 days, THEN 3 mLs (2.5 mg total) every 4 (four) hours as needed for wheezing or shortness of breath. Take your nebulizer OR your inhaler. (Patient not taking: No sig reported) 75 mL 2   albuterol  (VENTOLIN  HFA) 108 (90 Base) MCG/ACT inhaler Inhale 2 puffs into the lungs every 4 (four) hours while awake for 3 days, THEN 2 puffs every 4 (four) hours as needed for wheezing or shortness of breath. Take your inhaler OR your nebulizer.. (Patient not taking: No sig reported) 8 g 2   aspirin  81 MG chewable tablet Chew 81 mg by mouth daily.     benzonatate  (TESSALON ) 200 MG capsule Take 1 capsule (200 mg total) by mouth 3 (three) times daily as needed for cough. (Patient not taking: Reported on 03/15/2024) 20 capsule 0   BISACODYL  5 MG EC tablet Take by mouth as directed.     Blood Glucose Monitoring Suppl (ONETOUCH VERIO FLEX SYSTEM) w/Device KIT SMARTSIG:injection Daily     celecoxib  (CELEBREX ) 200 MG capsule Take 200 mg by mouth daily.     diclofenac  Sodium (VOLTAREN ) 1 % GEL Apply 1 application topically 4 (four) times daily. Bilateral knees (Patient taking differently: Apply 1 application  topically 4 (four) times daily as needed (pain). Bilateral knees) 350 g 4   doxycycline  (DORYX ) 100 MG EC tablet Take 100 mg by mouth 2 (two) times daily.     doxycycline  (VIBRA -TABS) 100 MG tablet Take 100 mg by mouth 2 (two) times daily.     empagliflozin  (JARDIANCE ) 10 MG TABS tablet Take 1 tablet (10 mg total) by mouth daily before breakfast. 90 tablet 3   fluticasone  (FLONASE ) 50 MCG/ACT nasal spray Place 2 sprays into both nostrils daily as needed for allergies.     furosemide  (LASIX ) 40 MG tablet Take 1 tablet (40 mg total) by mouth daily. 30 tablet 2   gabapentin  (NEURONTIN ) 300 MG capsule TAKE 1 CAPSULE(300 MG) BY MOUTH THREE TIMES DAILY 90 capsule 3   methocarbamol  (ROBAXIN ) 500 MG tablet Take 1 tablet (500 mg total) by mouth every 8 (eight) hours as needed for muscle spasms. (Patient not taking:  Reported on 03/15/2024) 60 tablet 2   MOUNJARO 5 MG/0.5ML Pen SMARTSIG:5 Milligram(s) SUB-Q Once a Week     Multiple Vitamin (MULTIVITAMIN WITH MINERALS) TABS tablet Take 1 tablet by mouth daily.     nicotine  polacrilex (NICORETTE ) 4 MG gum Take 1 each (4 mg total) by mouth as needed for smoking cessation. 100 tablet 0   omeprazole  (PRILOSEC) 20 MG capsule Take 20 mg by mouth daily.     omeprazole  (PRILOSEC) 20 MG capsule Take 20  mg by mouth daily as needed.     ONETOUCH VERIO test strip SMARTSIG:Via Meter     polyethylene glycol-electrolytes (NULYTELY ) 420 g solution as directed.     Potassium Chloride  ER 20 MEQ TBCR Take 20 mEq by mouth daily.     potassium chloride  SA (K-DUR,KLOR-CON ) 20 MEQ tablet Take 1 tablet (20 mEq total) by mouth daily. 90 tablet 3   rosuvastatin  (CRESTOR ) 20 MG tablet Take 1 tablet (20 mg total) by mouth daily. 90 tablet 3   sertraline  (ZOLOFT ) 100 MG tablet Take 100 mg by mouth at bedtime.      tiZANidine  (ZANAFLEX ) 4 MG tablet TAKE 1 TABLET(4 MG) BY MOUTH THREE TIMES DAILY AS NEEDED FOR MUSCLE SPASMS (Patient taking differently: Take 4 mg by mouth at bedtime.) 60 tablet 2   traZODone  (DESYREL ) 100 MG tablet Take 100 mg by mouth at bedtime.     varenicline (CHANTIX) 1 MG tablet Take 1 mg by mouth 2 (two) times daily.     No current facility-administered medications for this visit.    PHYSICAL EXAM There were no vitals filed for this visit. Middle-age woman in no distress Regular rate and rhythm Unlabored breathing Soft nontender abdomen  PERTINENT LABORATORY AND RADIOLOGIC DATA  Most recent CBC    Latest Ref Rng & Units 01/01/2024   10:25 AM 09/01/2021   11:43 AM 03/13/2018    2:31 PM  CBC  WBC 4.0 - 10.5 K/uL 10.3  9.9  17.3   Hemoglobin 12.0 - 15.0 g/dL 86.1  85.6  86.7   Hematocrit 36.0 - 46.0 % 42.0  45.3  42.5   Platelets 150 - 400 K/uL 281  314  347      Most recent CMP    Latest Ref Rng & Units 01/01/2024   10:25 AM 09/03/2021    4:08 AM  09/02/2021    4:58 AM  CMP  Glucose 70 - 99 mg/dL 899  873  828   BUN 6 - 20 mg/dL 14  24  20    Creatinine 0.44 - 1.00 mg/dL 8.96  9.03  8.83   Sodium 135 - 145 mmol/L 141  139  139   Potassium 3.5 - 5.1 mmol/L 3.8  4.3  3.9   Chloride 98 - 111 mmol/L 106  105  103   CO2 22 - 32 mmol/L 28  24  24    Calcium  8.9 - 10.3 mg/dL 9.5  9.7  9.8   Total Protein 6.5 - 8.1 g/dL 6.9     Total Bilirubin 0.0 - 1.2 mg/dL 0.3     Alkaline Phos 38 - 126 U/L 83     AST 15 - 41 U/L 19     ALT 0 - 44 U/L 12       Renal function CrCl cannot be calculated (Patient's most recent lab result is older than the maximum 21 days allowed.).  No results found for: HGBA1C  LDL Calculated  Date Value Ref Range Status  11/08/2016 61 0 - 99 mg/dL Final    MRI of the lumbar spine personally reviewed.  Normal vascular anatomy over the L5/S1 disc base.  Debby SAILOR. Magda, MD FACS [facility] Phone Number: [phone] 05/03/2024 11:38 AM   Total time spent on preparing this encounter including chart review, data review, collecting history, examining the patient, and coordinating care: 45 minutes  Portions of this report may have been transcribed using voice recognition software.  Every effort has  been made to ensure accuracy; however, inadvertent computerized transcription errors may still be present.

## 2024-05-03 NOTE — Progress Notes (Unsigned)
 [facility] [place]  ASSESSMENT / PLAN: Kelsey Morgan is a 53 y.o. female with plans to undergo L5/S1 anterior lumbar interbody fusion on 05/20/24 with Dr. Burnetta. I have reviewed the patient's imaging and feel it is safe to approach this anteriorly. I discussed the specific risks associated with spine exposure including vascular injury, urethral injury, bowel injury, nervous injury. I explained that if severe vascular injury was encountered we may need to abandon the spinal procedure to safely repair the injury. I explained the typical postoperative course for anterior exposure of the spine.The patient was understanding and wished to proceed.   CHIEF COMPLAINT: back pain  HISTORY OF PRESENT ILLNESS: Kelsey Morgan is a 53 y.o. female referred to clinic for evaluation of possible ALIF for back pain with radiculopathy.  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  The bulk of our visit was spent reviewing the rationale for vascular surgery involvement of the anterior exposure of the L5/S1 disc base.  Reviewed the common risks involved with this exposure.  Past Medical History:  Diagnosis Date   Arthritis    Back pain    Bronchitis    CHF (congestive heart failure) (HCC)    Chronic back pain    Gout    High cholesterol    Joint pain    Pneumonia    Sleep apnea     Past Surgical History:  Procedure Laterality Date   ABDOMINAL HYSTERECTOMY     ANKLE SURGERY     CHOLECYSTECTOMY     EXCISION MASS NECK N/A 11/12/2017   Procedure: EXCISION POSTERIOR  NECK CYST;  Surgeon: Rubin Calamity, MD;  Location: [facility] OR;  Service: General;  Laterality: N/A;   FOOT SURGERY     FRACTURE SURGERY     KNEE ARTHROSCOPY N/A    KNEE SURGERY     ORIF ANKLE FRACTURE      Family History  Problem Relation Age of Onset   Arthritis Mother    Hypertension Mother    Kidney failure Mother    Heart disease Mother        Onset in her 42s    Arthritis Father    Hypertension Sister    Diabetes Sister     Social History   Socioeconomic History   Marital status: Media Planner    Spouse name: Not on file   Number of children: Not on file   Years of education: Not on file   Highest education level: Not on file  Occupational History   Occupation: Truck driver  Tobacco Use   Smoking status: Every Day    Current packs/day: 0.00    Average packs/day: 0.3 packs/day for 25.0 years (6.3 ttl pk-yrs)    Types: Cigarettes    Start date: 01/01/1993    Last attempt to quit: 01/01/2018    Years since quitting: 6.3   Smokeless tobacco: Never   Tobacco comments:    03/15/2024 Patient smokes 15 cigarettes daily  Vaping Use   Vaping status: Never Used  Substance and Sexual Activity   Alcohol use: No   Drug use: No   Sexual activity: Not Currently  Other Topics Concern   Not on file  Social History Narrative   Lives with partner.     Social Drivers of Corporate Investment Banker Strain: Not on file  Food Insecurity: Not on file  Transportation Needs: Not on file  Physical Activity: Not on file  Stress: Not on file  Social Connections: Not on file  Intimate Partner Violence: Not on file    Allergies  Allergen Reactions   Atorvastatin      Other Reaction(s): Not available   Atorvastatin  Calcium      Other Reaction(s): cramps   Simvastatin     Other Reaction(s): cramps, Not available   Hydrocodone  Itching   Percocet [Oxycodone -Acetaminophen ] Itching   Vicodin [Hydrocodone -Acetaminophen ] Itching    Tolerates with Benadryl . Tolerates acetaminophen  alone.    Current Outpatient Medications  Medication Sig Dispense Refill   Accu-Chek Softclix Lancets lancets daily.     AIRSUPRA 90-80 MCG/ACT AERO Inhale 2 puffs into the lungs daily as needed. (Patient not taking: Reported on 03/15/2024)     albuterol  (PROVENTIL ) (2.5 MG/3ML) 0.083% nebulizer solution Take 3 mLs (2.5 mg total) by nebulization every 4 (four) hours while  awake for 3 days, THEN 3 mLs (2.5 mg total) every 4 (four) hours as needed for wheezing or shortness of breath. Take your nebulizer OR your inhaler. (Patient not taking: No sig reported) 75 mL 2   albuterol  (VENTOLIN  HFA) 108 (90 Base) MCG/ACT inhaler Inhale 2 puffs into the lungs every 4 (four) hours while awake for 3 days, THEN 2 puffs every 4 (four) hours as needed for wheezing or shortness of breath. Take your inhaler OR your nebulizer.. (Patient not taking: No sig reported) 8 g 2   aspirin  81 MG chewable tablet Chew 81 mg by mouth daily.     benzonatate  (TESSALON ) 200 MG capsule Take 1 capsule (200 mg total) by mouth 3 (three) times daily as needed for cough. (Patient not taking: Reported on 03/15/2024) 20 capsule 0   BISACODYL 5 MG EC tablet Take by mouth as directed.     Blood Glucose Monitoring Suppl (ONETOUCH VERIO FLEX SYSTEM) w/Device KIT SMARTSIG:injection Daily     celecoxib  (CELEBREX ) 200 MG capsule Take 200 mg by mouth daily.     diclofenac  Sodium (VOLTAREN ) 1 % GEL Apply 1 application topically 4 (four) times daily. Bilateral knees (Patient taking differently: Apply 1 application  topically 4 (four) times daily as needed (pain). Bilateral knees) 350 g 4   doxycycline  (DORYX ) 100 MG EC tablet Take 100 mg by mouth 2 (two) times daily.     doxycycline  (VIBRA -TABS) 100 MG tablet Take 100 mg by mouth 2 (two) times daily.     empagliflozin  (JARDIANCE ) 10 MG TABS tablet Take 1 tablet (10 mg total) by mouth daily before breakfast. 90 tablet 3   fluticasone  (FLONASE ) 50 MCG/ACT nasal spray Place 2 sprays into both nostrils daily as needed for allergies.     furosemide  (LASIX ) 40 MG tablet Take 1 tablet (40 mg total) by mouth daily. 30 tablet 2   gabapentin  (NEURONTIN ) 300 MG capsule TAKE 1 CAPSULE(300 MG) BY MOUTH THREE TIMES DAILY 90 capsule 3   methocarbamol  (ROBAXIN ) 500 MG tablet Take 1 tablet (500 mg total) by mouth every 8 (eight) hours as needed for muscle spasms. (Patient not taking:  Reported on 03/15/2024) 60 tablet 2   MOUNJARO 5 MG/0.5ML Pen SMARTSIG:5 Milligram(s) SUB-Q Once a Week     Multiple Vitamin (MULTIVITAMIN WITH MINERALS) TABS tablet Take 1 tablet by mouth daily.     nicotine  polacrilex (NICORETTE ) 4 MG gum Take 1 each (4 mg total) by mouth as needed for smoking cessation. 100 tablet 0   omeprazole  (PRILOSEC) 20 MG capsule Take 20 mg by mouth daily.     omeprazole  (PRILOSEC) 20 MG capsule Take 20  mg by mouth daily as needed.     ONETOUCH VERIO test strip SMARTSIG:Via Meter     polyethylene glycol-electrolytes (NULYTELY) 420 g solution as directed.     Potassium Chloride  ER 20 MEQ TBCR Take 20 mEq by mouth daily.     potassium chloride  SA (K-DUR,KLOR-CON ) 20 MEQ tablet Take 1 tablet (20 mEq total) by mouth daily. 90 tablet 3   rosuvastatin  (CRESTOR ) 20 MG tablet Take 1 tablet (20 mg total) by mouth daily. 90 tablet 3   sertraline  (ZOLOFT ) 100 MG tablet Take 100 mg by mouth at bedtime.      tiZANidine  (ZANAFLEX ) 4 MG tablet TAKE 1 TABLET(4 MG) BY MOUTH THREE TIMES DAILY AS NEEDED FOR MUSCLE SPASMS (Patient taking differently: Take 4 mg by mouth at bedtime.) 60 tablet 2   traZODone  (DESYREL ) 100 MG tablet Take 100 mg by mouth at bedtime.     varenicline (CHANTIX) 1 MG tablet Take 1 mg by mouth 2 (two) times daily.     No current facility-administered medications for this visit.    PHYSICAL EXAM There were no vitals filed for this visit. Middle-age woman in no distress Regular rate and rhythm Unlabored breathing Soft nontender abdomen  PERTINENT LABORATORY AND RADIOLOGIC DATA  Most recent CBC    Latest Ref Rng & Units 01/01/2024   10:25 AM 09/01/2021   11:43 AM 03/13/2018    2:31 PM  CBC  WBC 4.0 - 10.5 K/uL 10.3  9.9  17.3   Hemoglobin 12.0 - 15.0 g/dL 86.1  85.6  86.7   Hematocrit 36.0 - 46.0 % 42.0  45.3  42.5   Platelets 150 - 400 K/uL 281  314  347      Most recent CMP    Latest Ref Rng & Units 01/01/2024   10:25 AM 09/03/2021    4:08 AM  09/02/2021    4:58 AM  CMP  Glucose 70 - 99 mg/dL 899  873  828   BUN 6 - 20 mg/dL 14  24  20    Creatinine 0.44 - 1.00 mg/dL 8.96  9.03  8.83   Sodium 135 - 145 mmol/L 141  139  139   Potassium 3.5 - 5.1 mmol/L 3.8  4.3  3.9   Chloride 98 - 111 mmol/L 106  105  103   CO2 22 - 32 mmol/L 28  24  24    Calcium  8.9 - 10.3 mg/dL 9.5  9.7  9.8   Total Protein 6.5 - 8.1 g/dL 6.9     Total Bilirubin 0.0 - 1.2 mg/dL 0.3     Alkaline Phos 38 - 126 U/L 83     AST 15 - 41 U/L 19     ALT 0 - 44 U/L 12       Renal function CrCl cannot be calculated (Patient's most recent lab result is older than the maximum 21 days allowed.).  No results found for: HGBA1C  LDL Calculated  Date Value Ref Range Status  11/08/2016 61 0 - 99 mg/dL Final    MRI of the lumbar spine personally reviewed.  Normal vascular anatomy over the L5/S1 disc base.  Debby SAILOR. Magda, MD FACS [facility] Phone Number: [phone] 05/03/2024 11:38 AM   Total time spent on preparing this encounter including chart review, data review, collecting history, examining the patient, and coordinating care: 45 minutes  Portions of this report may have been transcribed using voice recognition software.  Every effort has  been made to ensure accuracy; however, inadvertent computerized transcription errors may still be present.

## 2024-05-04 ENCOUNTER — Ambulatory Visit: Attending: Vascular Surgery | Admitting: Vascular Surgery

## 2024-05-04 ENCOUNTER — Encounter: Payer: Self-pay | Admitting: Vascular Surgery

## 2024-05-04 ENCOUNTER — Ambulatory Visit: Admitting: Vascular Surgery

## 2024-05-04 VITALS — BP 117/84 | HR 68 | Temp 98.1°F | Ht 66.5 in | Wt 229.0 lb

## 2024-05-04 DIAGNOSIS — M5416 Radiculopathy, lumbar region: Secondary | ICD-10-CM | POA: Diagnosis not present

## 2024-05-07 ENCOUNTER — Other Ambulatory Visit: Payer: Self-pay

## 2024-05-17 ENCOUNTER — Encounter (HOSPITAL_COMMUNITY): Payer: Self-pay

## 2024-05-17 NOTE — Progress Notes (Signed)
 Surgical Instructions   Your procedure is scheduled on Thursday May 20, 2024. Report to [facility] Main Entrance A at 5:30 A.M., then check in with the Admitting office. Any questions or running late day of surgery: call [phone]  Questions prior to your surgery date: call [phone], Monday-Friday, 8am-4pm. If you experience any cold or flu symptoms such as cough, fever, chills, shortness of breath, etc. between now and your scheduled surgery, please notify us  at the above number.     Remember:  Do not eat after midnight the night before your surgery  You may drink clear liquids until 4:30 the morning of your surgery.   Clear liquids allowed are: Water, Non-Citrus Juices (without pulp), Carbonated Beverages, Clear Tea (no milk, honey, etc.), Black Coffee Only (NO MILK, CREAM OR POWDERED CREAMER of any kind), and Gatorade.    Take these medicines the morning of surgery with A SIP OF WATER  gabapentin  (NEURONTIN )  rosuvastatin  (CRESTOR )    May take these medicines IF NEEDED: albuterol  (VENTOLIN  HFA) 108 (90 Base) MCG/ACT inhaler  Please bring with you to the hospital fluticasone  (FLONASE )  traMADol  (ULTRAM )   Follow your surgeon's instructions on when to stop Asprin.  If no instructions were given by your surgeon then you will need to call the office to get those instructions.     One week prior to surgery, STOP taking any Aleve, Naproxen, Ibuprofen , Motrin , Advil , Goody's, BC's, all herbal medications, fish oil, and non-prescription vitamins.  This includes your celecoxib  (CELEBREX ) and diclofenac  Sodium (VOLTAREN ) 1 % GEL.   WHAT DO I DO ABOUT MY DIABETES MEDICATION?   Do not take oral diabetes medicines (pills) the morning of surgery.        DO NOT TAKE YOUR empagliflozin  (JARDIANCE ) 72 HOURS PRIOR TO SURGERY WITH THE LAST DOSE BEING 05/16/2024.         DO NOT TAKE YOUR MOUNJARO 7 DAYS PIROR TO SURGERY WITH THE LAST DOSE BEING NO LATER THAN 05/12/2024.     The  day of surgery, do not take other diabetes injectables, including Byetta (exenatide), Bydureon (exenatide ER), Victoza (liraglutide), or Trulicity (dulaglutide).  If your CBG is greater than 220 mg/dL, you may take  of your sliding scale (correction) dose of insulin .   HOW TO MANAGE YOUR DIABETES BEFORE AND AFTER SURGERY  Why is it important to control my blood sugar before and after surgery? Improving blood sugar levels before and after surgery helps healing and can limit problems. A way of improving blood sugar control is eating a healthy diet by:  Eating less sugar and carbohydrates  Increasing activity/exercise  Talking with your doctor about reaching your blood sugar goals High blood sugars (greater than 180 mg/dL) can raise your risk of infections and slow your recovery, so you will need to focus on controlling your diabetes during the weeks before surgery. Make sure that the doctor who takes care of your diabetes knows about your planned surgery including the date and location.  How do I manage my blood sugar before surgery? Check your blood sugar at least 4 times a day, starting 2 days before surgery, to make sure that the level is not too high or low.  Check your blood sugar the morning of your surgery when you wake up and every 2 hours until you get to the Short Stay unit.  If your blood sugar is less than 70 mg/dL, you will need to treat for low blood sugar: Do not take insulin . Treat a  low blood sugar (less than 70 mg/dL) with  cup of clear juice (cranberry or apple), 4 glucose tablets, OR glucose gel. Recheck blood sugar in 15 minutes after treatment (to make sure it is greater than 70 mg/dL). If your blood sugar is not greater than 70 mg/dL on recheck, call [phone] for further instructions. Report your blood sugar to the short stay nurse when you get to Short Stay.  If you are admitted to the hospital after surgery: Your blood sugar will be checked by the staff and  you will probably be given insulin  after surgery (instead of oral diabetes medicines) to make sure you have good blood sugar levels. The goal for blood sugar control after surgery is 80-180 mg/dL.                       Do NOT Smoke (Tobacco/Vaping) for 24 hours prior to your procedure.  If you use a CPAP at night, you may bring your mask/headgear for your overnight stay.   You will be asked to remove any contacts, glasses, piercing's, hearing aid's, dentures/partials prior to surgery. Please bring cases for these items if needed.    Patients discharged the day of surgery will not be allowed to drive home, and someone needs to stay with them for 24 hours.  SURGICAL WAITING ROOM VISITATION Patients may have no more than 2 support people in the waiting area - these visitors may rotate.   Pre-op nurse will coordinate an appropriate time for 1 ADULT support person, who may not rotate, to accompany patient in pre-op.  Children under the age of 71 must have an adult with them who is not the patient and must remain in the main waiting area with an adult.  If the patient needs to stay at the hospital during part of their recovery, the visitor guidelines for inpatient rooms apply.  Please refer to the [facility] website for the visitor guidelines for any additional information.   If you received a COVID test during your pre-op visit  it is requested that you wear a mask when out in public, stay away from anyone that may not be feeling well and notify your surgeon if you develop symptoms. If you have been in contact with anyone that has tested positive in the last 10 days please notify you surgeon.      Pre-operative 4 CHG Bathing Instructions   You can play a key role in reducing the risk of infection after surgery. Your skin needs to be as free of germs as possible. You can reduce the number of germs on your skin by washing with CHG (chlorhexidine  gluconate) soap before surgery. CHG is an  antiseptic soap that kills germs and continues to kill germs even after washing.   DO NOT use if you have an allergy  to chlorhexidine /CHG or antibacterial soaps. If your skin becomes reddened or irritated, stop using the CHG and notify one of our RNs at [phone].   Please shower with the CHG soap starting 4 days before surgery using the following schedule:     Please keep in mind the following:  DO NOT shave, including legs and underarms, starting the day of your first shower.   Place clean sheets on your bed the day you start using CHG soap. Use a clean washcloth (not used since being washed) for each shower. DO NOT sleep with pets once you start using the CHG.   CHG Shower Instructions:  Wash your face  and private area with normal soap. If you choose to wash your hair, wash first with your normal shampoo.  After you use shampoo/soap, rinse your hair and body thoroughly to remove shampoo/soap residue.  Turn the water OFF and apply  bottle of CHG soap to a CLEAN washcloth.  Apply CHG soap ONLY FROM YOUR NECK DOWN TO YOUR TOES (washing for 3-5 minutes)  DO NOT use CHG soap on face, private areas, open wounds, or sores.  Pay special attention to the area where your surgery is being performed.  If you are having back surgery, having someone wash your back for you may be helpful. Wait 2 minutes after CHG soap is applied, then you may rinse off the CHG soap.  Pat dry with a clean towel  Put on clean clothes/pajamas   If you choose to wear lotion, please use ONLY the CHG-compatible lotions that are listed below.  Additional instructions for the day of surgery:  If you choose, you may shower the morning of surgery with an antibacterial soap.  DO NOT APPLY any lotions, deodorants or perfumes.   Do not bring valuables to the hospital. [facility] is not responsible for any belongings/valuables. Do not wear nail polish, gel polish, artificial nails, or any other type of covering on natural  nails (fingers and toes) Do not wear jewelry or makeup Put on clean/comfortable clothes.  Please brush your teeth.  Ask your nurse before applying any prescription medications to the skin.     CHG Compatible Lotions   Aveeno Moisturizing lotion  Cetaphil Moisturizing Cream  Cetaphil Moisturizing Lotion  Clairol Herbal Essence Moisturizing Lotion, Dry Skin  Clairol Herbal Essence Moisturizing Lotion, Extra Dry Skin  Clairol Herbal Essence Moisturizing Lotion, Normal Skin  Curel Age Defying Therapeutic Moisturizing Lotion with Alpha Hydroxy  Curel Extreme Care Body Lotion  Curel Soothing Hands Moisturizing Hand Lotion  Curel Therapeutic Moisturizing Cream, Fragrance-Free  Curel Therapeutic Moisturizing Lotion, Fragrance-Free  Curel Therapeutic Moisturizing Lotion, Original Formula  Eucerin Daily Replenishing Lotion  Eucerin Dry Skin Therapy Plus Alpha Hydroxy Crme  Eucerin Dry Skin Therapy Plus Alpha Hydroxy Lotion  Eucerin Original Crme  Eucerin Original Lotion  Eucerin Plus Crme Eucerin Plus Lotion  Eucerin TriLipid Replenishing Lotion  Keri Anti-Bacterial Hand Lotion  Keri Deep Conditioning Original Lotion Dry Skin Formula Softly Scented  Keri Deep Conditioning Original Lotion, Fragrance Free Sensitive Skin Formula  Keri Lotion Fast Absorbing Fragrance Free Sensitive Skin Formula  Keri Lotion Fast Absorbing Softly Scented Dry Skin Formula  Keri Original Lotion  Keri Skin Renewal Lotion Keri Silky Smooth Lotion  Keri Silky Smooth Sensitive Skin Lotion  Nivea Body Creamy Conditioning Oil  Nivea Body Extra Enriched Lotion  Nivea Body Original Lotion  Nivea Body Sheer Moisturizing Lotion Nivea Crme  Nivea Skin Firming Lotion  NutraDerm 30 Skin Lotion  NutraDerm Skin Lotion  NutraDerm Therapeutic Skin Cream  NutraDerm Therapeutic Skin Lotion  ProShield Protective Hand Cream  Provon moisturizing lotion  Please read over the following fact sheets that you were  given.

## 2024-05-18 ENCOUNTER — Inpatient Hospital Stay (HOSPITAL_COMMUNITY): Admission: RE | Admit: 2024-05-18 | Discharge: 2024-05-18

## 2024-05-19 ENCOUNTER — Other Ambulatory Visit: Payer: Self-pay

## 2024-05-19 ENCOUNTER — Encounter (HOSPITAL_COMMUNITY): Payer: Self-pay

## 2024-05-19 ENCOUNTER — Encounter (HOSPITAL_COMMUNITY)
Admission: RE | Admit: 2024-05-19 | Discharge: 2024-05-19 | Disposition: A | Discharge status: Home / Self Care | Source: Ambulatory Visit | Attending: Orthopedic Surgery | Admitting: Orthopedic Surgery

## 2024-05-19 VITALS — BP 123/68 | HR 53 | Temp 97.9°F | Resp 18 | Ht 66.5 in | Wt 228.1 lb

## 2024-05-19 DIAGNOSIS — Z6836 Body mass index (BMI) 36.0-36.9, adult: Secondary | ICD-10-CM | POA: Insufficient documentation

## 2024-05-19 DIAGNOSIS — I5032 Chronic diastolic (congestive) heart failure: Secondary | ICD-10-CM | POA: Insufficient documentation

## 2024-05-19 DIAGNOSIS — E66812 Obesity, class 2: Secondary | ICD-10-CM | POA: Diagnosis not present

## 2024-05-19 DIAGNOSIS — F17290 Nicotine dependence, other tobacco product, uncomplicated: Secondary | ICD-10-CM | POA: Insufficient documentation

## 2024-05-19 DIAGNOSIS — Z01812 Encounter for preprocedural laboratory examination: Secondary | ICD-10-CM | POA: Insufficient documentation

## 2024-05-19 DIAGNOSIS — E119 Type 2 diabetes mellitus without complications: Secondary | ICD-10-CM | POA: Diagnosis not present

## 2024-05-19 DIAGNOSIS — Z01818 Encounter for other preprocedural examination: Secondary | ICD-10-CM

## 2024-05-19 LAB — CBC
HCT: 50.3 % — ABNORMAL HIGH (ref 36.0–46.0)
Hemoglobin: 15.9 g/dL — ABNORMAL HIGH (ref 12.0–15.0)
MCH: 27.4 pg (ref 26.0–34.0)
MCHC: 31.6 g/dL (ref 30.0–36.0)
MCV: 86.6 fL (ref 80.0–100.0)
Platelets: 284 K/uL (ref 150–400)
RBC: 5.81 MIL/uL — ABNORMAL HIGH (ref 3.87–5.11)
RDW: 13.8 % (ref 11.5–15.5)
WBC: 8.9 K/uL (ref 4.0–10.5)
nRBC: 0 % (ref 0.0–0.2)

## 2024-05-19 LAB — TYPE AND SCREEN
ABO/RH(D): O POS
Antibody Screen: NEGATIVE

## 2024-05-19 LAB — SURGICAL PCR SCREEN
MRSA, PCR: NEGATIVE
Staphylococcus aureus: NEGATIVE

## 2024-05-19 LAB — BASIC METABOLIC PANEL WITH GFR
Anion gap: 10 (ref 5–15)
BUN: 7 mg/dL (ref 6–20)
CO2: 26 mmol/L (ref 22–32)
Calcium: 9.5 mg/dL (ref 8.9–10.3)
Chloride: 108 mmol/L (ref 98–111)
Creatinine, Ser: 0.89 mg/dL (ref 0.44–1.00)
GFR, Estimated: >60 mL/min (ref >60)
Glucose, Bld: 93 mg/dL (ref 70–99)
Potassium: 3.5 mmol/L (ref 3.5–5.1)
Sodium: 144 mmol/L (ref 135–145)

## 2024-05-19 NOTE — Progress Notes (Signed)
 PCP - Vyvyan Sun,MD Cardiologist - Georganna Floretta COME  PPM/ICD - denies Device Orders -  Rep Notified -   Chest x-ray - 01/01/24 EKG - 03/15/24 Stress Test - 10/31/16 ECHO - 09/02/21 Cardiac Cath - denies  Sleep Study - 2023 +OSA CPAP - does not use CPAP since losing weight.  Fasting Blood Sugar - pt states she has prediabetes and does not check her sugar at home.  Checks Blood Sugar _____ times a day  Last dose of GLP1 agonist-  per pt last dose of Mounjaro was 05/09/24 GLP1 instructions: DO NOT TAKE YOUR MOUNJARO 7 DAYS PIROR TO SURGERY WITH THE LAST DOSE BEING NO LATER THAN 05/12/2024.   Blood Thinner Instructions:na Aspirin  Instructions:pt report she stopped aspirin  12/9 per Dr. Burnetta instructions  ERAS Protcol -clears until 0430 PRE-SURGERY Ensure or G2- no  COVID TEST- na   Anesthesia review: yes- cardiac clearance  Patient denies shortness of breath, fever, cough and chest pain at PAT appointment   All instructions explained to the patient, with a verbal understanding of the material. Patient agrees to go over the instructions while at home for a better understanding.  The opportunity to ask questions was provided.

## 2024-05-19 NOTE — Progress Notes (Signed)
 Anesthesia Chart Review:  53 year old female follows with cardiology for history of HFpEF, HLD, class II obesity BMI 36.  Echo 09/2021 showed LVEF 60 to 65%, grade 1 DD, normal RV, no significant valvular abnormalities.  Nuclear stress 10/2016 was low risk.  Cardiac clearance per telephone encounter by Lamarr Satterfield, NP on 05/03/2024, Chart reviewed as part of pre-operative protocol coverage. Given past medical history and time since last visit, based on ACC/AHA guidelines, SHIRIN ECHEVERRY is at acceptable risk for the planned procedure without further cardiovascular testing. Regarding ASA therapy, we recommend continuation of ASA throughout the perioperative period.  However, if the surgeon feels that cessation of ASA is required in the perioperative period, it may be stopped 5-7 days prior to surgery with a plan to resume it as soon as felt to be feasible from a surgical standpoint in the post-operative period. The patient was advised that if she develops new symptoms prior to surgery to contact our office to arrange for a follow-up visit, and she verbalized understanding.  Other pertinent history includes current smoker, non-insulin -dependent DM2 (A1c 5.8 120 2425), OSA (reportedly no longer on CPAP since losing weight)  Patient reports last dose of Mounjaro 05/09/2024.  Preop labs reviewed, unremarkable.  EKG 03/15/2024: Sinus bradycardia with sinus arrhythmia.  Rate 55.  Low voltage, precordial leads.  TTE 09/02/2021:  1. Left ventricular ejection fraction, by estimation, is 60 to 65%. The  left ventricle has normal function. The left ventricle has no regional  wall motion abnormalities. Left ventricular diastolic parameters are  consistent with Grade I diastolic  dysfunction (impaired relaxation).   2. Right ventricular systolic function is normal. The right ventricular  size is normal.   3. The mitral valve is normal in structure. No evidence of mitral valve  regurgitation. No evidence of  mitral stenosis.   4. The aortic valve is tricuspid. Aortic valve regurgitation is not  visualized. No aortic stenosis is present.   5. The inferior vena cava is normal in size with greater than 50%  respiratory variability, suggesting right atrial pressure of 3 mmHg.   Nuclear stress 10/31/2016: The left ventricular ejection fraction is normal (55-65%). Nuclear stress EF: 64%. No T wave inversion was noted during stress. There was no ST segment deviation noted during stress. This is a low risk study.   No reversible ischemia. LVEF 64% with normal wall motion. This is a low risk study.    Lynwood Geofm RIGGERS [facility] Short Stay Center/Anesthesiology Phone [phone] 05/19/2024 4:03 PM

## 2024-05-19 NOTE — Anesthesia Preprocedure Evaluation (Addendum)
 Anesthesia Evaluation  Patient identified by MRN, date of birth, ID band Patient awake    Reviewed: Allergy  & Precautions, NPO status , Patient's Chart, lab work & pertinent test results  History of Anesthesia Complications Negative for: history of anesthetic complications  Airway Mallampati: II  TM Distance: >3 FB Neck ROM: Full    Dental  (+) Dental Advisory Given, Chipped, Poor Dentition, Loose, Missing,    Pulmonary sleep apnea (no longer on cpap) , Current Smoker and Patient abstained from smoking.   Pulmonary exam normal        Cardiovascular negative cardio ROS Normal cardiovascular exam     Neuro/Psych negative neurological ROS  negative psych ROS   GI/Hepatic negative GI ROS, Neg liver ROS,,,  Endo/Other   On GLP-1a Obesity Pre-DM   Renal/GU negative Renal ROS     Musculoskeletal  (+) Arthritis ,    Abdominal  (+) + obese  Peds  Hematology negative hematology ROS (+)   Anesthesia Other Findings Chronic back pain   Reproductive/Obstetrics                              Anesthesia Physical Anesthesia Plan  ASA: 2  Anesthesia Plan: General   Post-op Pain Management: Regional block*, Ketamine  IV*, Toradol  IV (intra-op)* and Ofirmev  IV (intra-op)*   Induction: Intravenous  PONV Risk Score and Plan: 2 and Treatment may vary due to age or medical condition, Ondansetron , Dexamethasone , Midazolam  and Scopolamine  patch - Pre-op  Airway Management Planned: Oral ETT  Additional Equipment: None  Intra-op Plan:   Post-operative Plan: Extubation in OR  Informed Consent: I have reviewed the patients History and Physical, chart, labs and discussed the procedure including the risks, benefits and alternatives for the proposed anesthesia with the patient or authorized representative who has indicated his/her understanding and acceptance.     Dental advisory given  Plan  Discussed with: CRNA and Anesthesiologist  Anesthesia Plan Comments: (PAT note by Lynwood Hope, PA-C)         Anesthesia Quick Evaluation

## 2024-05-19 NOTE — Progress Notes (Signed)
 Surgical Instructions   Your procedure is scheduled on Thursday May 20, 2024. Report to [facility] Main Entrance A at 5:30 A.M., then check in with the Admitting office. Any questions or running late day of surgery: call [phone]  Questions prior to your surgery date: call [phone], Monday-Friday, 8am-4pm. If you experience any cold or flu symptoms such as cough, fever, chills, shortness of breath, etc. between now and your scheduled surgery, please notify us  at the above number.     Remember:  Do not eat after midnight the night before your surgery  You may drink clear liquids until 4:30 the morning of your surgery.   Clear liquids allowed are: Water, Non-Citrus Juices (without pulp), Carbonated Beverages, Clear Tea (no milk, honey, etc.), Black Coffee Only (NO MILK, CREAM OR POWDERED CREAMER of any kind), and Gatorade.    Take these medicines the morning of surgery with A SIP OF WATER  gabapentin  (NEURONTIN )  rosuvastatin  (CRESTOR )  empagliflozin  (JARDIANCE )   May take these medicines IF NEEDED: albuterol  (VENTOLIN  HFA) 108 (90 Base) MCG/ACT inhaler  Please bring with you to the hospital fluticasone  (FLONASE )  traMADol  (ULTRAM )   Follow your surgeon's instructions on when to stop Asprin.  If no instructions were given by your surgeon then you will need to call the office to get those instructions.     One week prior to surgery, STOP taking any Aleve, Naproxen, Ibuprofen , Motrin , Advil , Goody's, BC's, all herbal medications, fish oil, and non-prescription vitamins.  This includes your celecoxib  (CELEBREX ).          DO NOT TAKE YOUR MOUNJARO 7 DAYS PIROR TO SURGERY WITH THE LAST DOSE BEING NO LATER THAN 05/12/2024.                         Do NOT Smoke (Tobacco/Vaping) for 24 hours prior to your procedure.  If you use a CPAP at night, you may bring your mask/headgear for your overnight stay.   You will be asked to remove any contacts, glasses, piercing's,  hearing aid's, dentures/partials prior to surgery. Please bring cases for these items if needed.    Patients discharged the day of surgery will not be allowed to drive home, and someone needs to stay with them for 24 hours.  SURGICAL WAITING ROOM VISITATION Patients may have no more than 2 support people in the waiting area - these visitors may rotate.   Pre-op nurse will coordinate an appropriate time for 1 ADULT support person, who may not rotate, to accompany patient in pre-op.  Children under the age of 1 must have an adult with them who is not the patient and must remain in the main waiting area with an adult.  If the patient needs to stay at the hospital during part of their recovery, the visitor guidelines for inpatient rooms apply.  Please refer to the [facility] website for the visitor guidelines for any additional information.   If you received a COVID test during your pre-op visit  it is requested that you wear a mask when out in public, stay away from anyone that may not be feeling well and notify your surgeon if you develop symptoms. If you have been in contact with anyone that has tested positive in the last 10 days please notify you surgeon.      Pre-operative 4 CHG Bathing Instructions   You can play a key role in reducing the risk of infection after surgery. Your skin needs  to be as free of germs as possible. You can reduce the number of germs on your skin by washing with CHG (chlorhexidine  gluconate) soap before surgery. CHG is an antiseptic soap that kills germs and continues to kill germs even after washing.   DO NOT use if you have an allergy  to chlorhexidine /CHG or antibacterial soaps. If your skin becomes reddened or irritated, stop using the CHG and notify one of our RNs at [phone].   Please shower with the CHG soap starting 4 days before surgery using the following schedule:     Please keep in mind the following:  DO NOT shave, including legs and  underarms, starting the day of your first shower.   Place clean sheets on your bed the day you start using CHG soap. Use a clean washcloth (not used since being washed) for each shower. DO NOT sleep with pets once you start using the CHG.   CHG Shower Instructions:  Wash your face and private area with normal soap. If you choose to wash your hair, wash first with your normal shampoo.  After you use shampoo/soap, rinse your hair and body thoroughly to remove shampoo/soap residue.  Turn the water OFF and apply  bottle of CHG soap to a CLEAN washcloth.  Apply CHG soap ONLY FROM YOUR NECK DOWN TO YOUR TOES (washing for 3-5 minutes)  DO NOT use CHG soap on face, private areas, open wounds, or sores.  Pay special attention to the area where your surgery is being performed.  If you are having back surgery, having someone wash your back for you may be helpful. Wait 2 minutes after CHG soap is applied, then you may rinse off the CHG soap.  Pat dry with a clean towel  Put on clean clothes/pajamas   If you choose to wear lotion, please use ONLY the CHG-compatible lotions that are listed below.  Additional instructions for the day of surgery:  If you choose, you may shower the morning of surgery with an antibacterial soap.  DO NOT APPLY any lotions, deodorants or perfumes.   Do not bring valuables to the hospital. [facility] is not responsible for any belongings/valuables. Do not wear nail polish, gel polish, artificial nails, or any other type of covering on natural nails (fingers and toes) Do not wear jewelry or makeup Put on clean/comfortable clothes.  Please brush your teeth.  Ask your nurse before applying any prescription medications to the skin.     CHG Compatible Lotions   Aveeno Moisturizing lotion  Cetaphil Moisturizing Cream  Cetaphil Moisturizing Lotion  Clairol Herbal Essence Moisturizing Lotion, Dry Skin  Clairol Herbal Essence Moisturizing Lotion, Extra Dry Skin  Clairol  Herbal Essence Moisturizing Lotion, Normal Skin  Curel Age Defying Therapeutic Moisturizing Lotion with Alpha Hydroxy  Curel Extreme Care Body Lotion  Curel Soothing Hands Moisturizing Hand Lotion  Curel Therapeutic Moisturizing Cream, Fragrance-Free  Curel Therapeutic Moisturizing Lotion, Fragrance-Free  Curel Therapeutic Moisturizing Lotion, Original Formula  Eucerin Daily Replenishing Lotion  Eucerin Dry Skin Therapy Plus Alpha Hydroxy Crme  Eucerin Dry Skin Therapy Plus Alpha Hydroxy Lotion  Eucerin Original Crme  Eucerin Original Lotion  Eucerin Plus Crme Eucerin Plus Lotion  Eucerin TriLipid Replenishing Lotion  Keri Anti-Bacterial Hand Lotion  Keri Deep Conditioning Original Lotion Dry Skin Formula Softly Scented  Keri Deep Conditioning Original Lotion, Fragrance Free Sensitive Skin Formula  Keri Lotion Fast Absorbing Fragrance Free Sensitive Skin Formula  Keri Lotion Fast Absorbing Softly Scented Dry Skin Formula  Levorn  Original Lotion  [employer] Skin Renewal Lotion Keri Silky Smooth Lotion  Keri Silky Smooth Sensitive Skin Lotion  Nivea Body Creamy Conditioning Oil  Nivea Body Extra Enriched Teacher, Adult Education Moisturizing Lotion Nivea Crme  Nivea Skin Firming Lotion  NutraDerm 30 Skin Lotion  NutraDerm Skin Lotion  NutraDerm Therapeutic Skin Cream  NutraDerm Therapeutic Skin Lotion  ProShield Protective Hand Cream  Provon moisturizing lotion  Please read over the following fact sheets that you were given.

## 2024-05-20 ENCOUNTER — Ambulatory Visit (HOSPITAL_COMMUNITY)

## 2024-05-20 ENCOUNTER — Ambulatory Visit (HOSPITAL_BASED_OUTPATIENT_CLINIC_OR_DEPARTMENT_OTHER): Admitting: Anesthesiology

## 2024-05-20 ENCOUNTER — Encounter: Admission: RE | Disposition: A | Payer: Self-pay | Attending: Orthopedic Surgery

## 2024-05-20 ENCOUNTER — Ambulatory Visit (HOSPITAL_COMMUNITY): Admitting: Anesthesiology

## 2024-05-20 ENCOUNTER — Observation Stay (HOSPITAL_COMMUNITY)
Admission: RE | Admit: 2024-05-20 | Discharge: 2024-05-22 | Disposition: A | Discharge status: Home / Self Care | Attending: Orthopedic Surgery | Admitting: Orthopedic Surgery

## 2024-05-20 DIAGNOSIS — Z981 Arthrodesis status: Principal | ICD-10-CM

## 2024-05-20 DIAGNOSIS — M5417 Radiculopathy, lumbosacral region: Secondary | ICD-10-CM | POA: Diagnosis not present

## 2024-05-20 DIAGNOSIS — M5136 Other intervertebral disc degeneration, lumbar region with discogenic back pain only: Secondary | ICD-10-CM | POA: Diagnosis present

## 2024-05-20 DIAGNOSIS — M5117 Intervertebral disc disorders with radiculopathy, lumbosacral region: Secondary | ICD-10-CM | POA: Diagnosis not present

## 2024-05-20 DIAGNOSIS — F17210 Nicotine dependence, cigarettes, uncomplicated: Secondary | ICD-10-CM | POA: Insufficient documentation

## 2024-05-20 DIAGNOSIS — Z01818 Encounter for other preprocedural examination: Secondary | ICD-10-CM

## 2024-05-20 DIAGNOSIS — I509 Heart failure, unspecified: Secondary | ICD-10-CM | POA: Diagnosis not present

## 2024-05-20 DIAGNOSIS — M4807 Spinal stenosis, lumbosacral region: Secondary | ICD-10-CM | POA: Diagnosis not present

## 2024-05-20 DIAGNOSIS — M545 Low back pain, unspecified: Secondary | ICD-10-CM | POA: Diagnosis not present

## 2024-05-20 HISTORY — PX: ABDOMINAL EXPOSURE: SHX5708

## 2024-05-20 HISTORY — PX: ANTERIOR LUMBAR FUSION: SHX1170

## 2024-05-20 LAB — CREATININE, SERUM
Creatinine, Ser: 1.02 mg/dL — ABNORMAL HIGH (ref 0.44–1.00)
GFR, Estimated: >60 mL/min (ref >60)

## 2024-05-20 LAB — GLUCOSE, CAPILLARY
Glucose-Capillary: 92 mg/dL (ref 70–99)
Glucose-Capillary: 136 mg/dL — ABNORMAL HIGH (ref 70–99)

## 2024-05-20 LAB — CBC
HCT: 41.8 % (ref 36.0–46.0)
Hemoglobin: 13.2 g/dL (ref 12.0–15.0)
MCH: 27.4 pg (ref 26.0–34.0)
MCHC: 31.6 g/dL (ref 30.0–36.0)
MCV: 86.7 fL (ref 80.0–100.0)
Platelets: 257 K/uL (ref 150–400)
RBC: 4.82 MIL/uL (ref 3.87–5.11)
RDW: 13.8 % (ref 11.5–15.5)
WBC: 12.2 K/uL — ABNORMAL HIGH (ref 4.0–10.5)
nRBC: 0 % (ref 0.0–0.2)

## 2024-05-20 SURGERY — ANTERIOR LUMBAR FUSION 1 LEVEL
Anesthesia: General | Site: Spine Lumbar

## 2024-05-20 MED ORDER — SODIUM CHLORIDE 0.9 % IV SOLN: 250.0000 mL | INTRAVENOUS | Status: AC

## 2024-05-20 MED ORDER — OXYCODONE HCL 5 MG PO TABS: 5.0000 mg | ORAL_TABLET | ORAL | Status: DC | PRN

## 2024-05-20 MED ORDER — POTASSIUM CHLORIDE CRYS ER 20 MEQ PO TBCR
20.0000 meq | EXTENDED_RELEASE_TABLET | Freq: Every day | ORAL | Status: DC
  Administered 2024-05-20 – 2024-05-22 (×2): 20 meq via ORAL
  Filled 2024-05-20 (×2): qty 1

## 2024-05-20 MED ORDER — HYDROMORPHONE HCL 1 MG/ML IJ SOLN
INTRAMUSCULAR | Status: AC
  Filled 2024-05-20: qty 0.5

## 2024-05-20 MED ORDER — DEXAMETHASONE SOD PHOSPHATE PF 10 MG/ML IJ SOLN
INTRAMUSCULAR | Status: DC | PRN
  Administered 2024-05-20: 09:00:00 10 mg via INTRAVENOUS

## 2024-05-20 MED ORDER — ENOXAPARIN SODIUM 40 MG/0.4ML IJ SOSY
40.0000 mg | PREFILLED_SYRINGE | INTRAMUSCULAR | Status: DC
  Administered 2024-05-21: 40 mg via SUBCUTANEOUS
  Filled 2024-05-20: qty 0.4

## 2024-05-20 MED ORDER — FENTANYL CITRATE (PF) 100 MCG/2ML IJ SOLN
INTRAMUSCULAR | Status: AC
  Filled 2024-05-20: qty 2

## 2024-05-20 MED ORDER — INSULIN ASPART 100 UNIT/ML IJ SOLN: 0.0000 [IU] | Freq: Three times a day (TID) | INTRAMUSCULAR | Status: DC

## 2024-05-20 MED ORDER — PROPOFOL 10 MG/ML IV BOLUS
INTRAVENOUS | Status: AC
  Filled 2024-05-20: qty 20

## 2024-05-20 MED ORDER — METHOCARBAMOL 1000 MG/10ML IJ SOLN: 500.0000 mg | Freq: Four times a day (QID) | INTRAMUSCULAR | Status: DC | PRN

## 2024-05-20 MED ORDER — DIPHENHYDRAMINE HCL 50 MG/ML IJ SOLN
INTRAMUSCULAR | Status: DC | PRN
  Administered 2024-05-20: 11:00:00 12.5 mg via INTRAVENOUS

## 2024-05-20 MED ORDER — SCOPOLAMINE 1 MG/3DAYS TD PT72
1.0000 | MEDICATED_PATCH | TRANSDERMAL | Status: DC
  Administered 2024-05-20: 07:00:00 1 mg via TRANSDERMAL
  Filled 2024-05-20: qty 1

## 2024-05-20 MED ORDER — MIDAZOLAM HCL (PF) 2 MG/2ML IJ SOLN
INTRAMUSCULAR | Status: DC | PRN
  Administered 2024-05-20: 08:00:00 2 mg via INTRAVENOUS

## 2024-05-20 MED ORDER — PHENYLEPHRINE 80 MCG/ML (10ML) SYRINGE FOR IV PUSH (FOR BLOOD PRESSURE SUPPORT)
PREFILLED_SYRINGE | INTRAVENOUS | Status: AC
  Filled 2024-05-20: qty 10

## 2024-05-20 MED ORDER — BUPIVACAINE-EPINEPHRINE 0.25% -1:200000 IJ SOLN
INTRAMUSCULAR | Status: DC | PRN
  Administered 2024-05-20: 12:00:00 10 mL

## 2024-05-20 MED ORDER — PHENOL 1.4 % MT LIQD: 1.0000 | OROMUCOSAL | Status: DC | PRN

## 2024-05-20 MED ORDER — METHOCARBAMOL 500 MG PO TABS: 500.0000 mg | ORAL_TABLET | Freq: Three times a day (TID) | ORAL | 0 refills | Status: AC | PRN

## 2024-05-20 MED ORDER — ALBUTEROL SULFATE (2.5 MG/3ML) 0.083% IN NEBU: 3.0000 mL | INHALATION_SOLUTION | RESPIRATORY_TRACT | Status: DC | PRN

## 2024-05-20 MED ORDER — METHOCARBAMOL 500 MG PO TABS
500.0000 mg | ORAL_TABLET | Freq: Four times a day (QID) | ORAL | Status: DC | PRN
  Administered 2024-05-20 – 2024-05-22 (×5): 500 mg via ORAL
  Filled 2024-05-20 (×5): qty 1

## 2024-05-20 MED ORDER — ONDANSETRON HCL 4 MG PO TABS: 4.0000 mg | ORAL_TABLET | Freq: Three times a day (TID) | ORAL | 0 refills | Status: AC | PRN

## 2024-05-20 MED ORDER — ORAL CARE MOUTH RINSE: 15.0000 mL | Freq: Once | OROMUCOSAL | Status: AC

## 2024-05-20 MED ORDER — GABAPENTIN 300 MG PO CAPS
300.0000 mg | ORAL_CAPSULE | Freq: Three times a day (TID) | ORAL | Status: DC
  Administered 2024-05-20 – 2024-05-22 (×6): 300 mg via ORAL
  Filled 2024-05-20 (×6): qty 1

## 2024-05-20 MED ORDER — ONDANSETRON HCL 4 MG/2ML IJ SOLN: 4.0000 mg | Freq: Four times a day (QID) | INTRAMUSCULAR | Status: DC | PRN

## 2024-05-20 MED ORDER — GLYCOPYRROLATE PF 0.2 MG/ML IJ SOSY
PREFILLED_SYRINGE | INTRAMUSCULAR | Status: DC | PRN
  Administered 2024-05-20 (×3): .1 mg via INTRAVENOUS

## 2024-05-20 MED ORDER — ROSUVASTATIN CALCIUM 20 MG PO TABS
20.0000 mg | ORAL_TABLET | Freq: Every day | ORAL | Status: DC
  Administered 2024-05-20 – 2024-05-22 (×3): 20 mg via ORAL
  Filled 2024-05-20 (×3): qty 1

## 2024-05-20 MED ORDER — EMPAGLIFLOZIN 10 MG PO TABS
10.0000 mg | ORAL_TABLET | Freq: Every day | ORAL | Status: DC
  Administered 2024-05-21 – 2024-05-22 (×2): 10 mg via ORAL
  Filled 2024-05-20 (×2): qty 1

## 2024-05-20 MED ORDER — INSULIN ASPART 100 UNIT/ML IJ SOLN: 0.0000 [IU] | Freq: Every day | INTRAMUSCULAR | Status: DC

## 2024-05-20 MED ORDER — AMISULPRIDE (ANTIEMETIC) 5 MG/2ML IV SOLN: 10.0000 mg | Freq: Once | INTRAVENOUS | Status: DC | PRN

## 2024-05-20 MED ORDER — ONDANSETRON HCL 4 MG PO TABS
4.0000 mg | ORAL_TABLET | Freq: Four times a day (QID) | ORAL | Status: DC | PRN
  Administered 2024-05-22: 4 mg via ORAL
  Filled 2024-05-20: qty 1

## 2024-05-20 MED ORDER — ENOXAPARIN SODIUM 40 MG/0.4ML IJ SOSY: 40.0000 mg | PREFILLED_SYRINGE | INTRAMUSCULAR | 0 refills | Status: AC

## 2024-05-20 MED ORDER — GLYCOPYRROLATE PF 0.2 MG/ML IJ SOSY
PREFILLED_SYRINGE | INTRAMUSCULAR | Status: AC
  Filled 2024-05-20: qty 1

## 2024-05-20 MED ORDER — FLUTICASONE PROPIONATE 50 MCG/ACT NA SUSP: 2.0000 | Freq: Every day | NASAL | Status: DC | PRN

## 2024-05-20 MED ORDER — DOCUSATE SODIUM 100 MG PO CAPS
100.0000 mg | ORAL_CAPSULE | Freq: Two times a day (BID) | ORAL | Status: DC
  Administered 2024-05-20 – 2024-05-22 (×3): 100 mg via ORAL
  Filled 2024-05-20 (×4): qty 1

## 2024-05-20 MED ORDER — ONDANSETRON HCL 4 MG/2ML IJ SOLN
INTRAMUSCULAR | Status: AC
  Filled 2024-05-20: qty 2

## 2024-05-20 MED ORDER — SERTRALINE HCL 50 MG PO TABS
150.0000 mg | ORAL_TABLET | Freq: Every day | ORAL | Status: DC
  Administered 2024-05-20 – 2024-05-21 (×2): 150 mg via ORAL
  Filled 2024-05-20 (×2): qty 1

## 2024-05-20 MED ORDER — EPHEDRINE SULFATE-NACL 50-0.9 MG/10ML-% IV SOSY
PREFILLED_SYRINGE | INTRAVENOUS | Status: DC | PRN
  Administered 2024-05-20 (×2): 5 mg via INTRAVENOUS

## 2024-05-20 MED ORDER — 0.9 % SODIUM CHLORIDE (POUR BTL) OPTIME
TOPICAL | Status: DC | PRN
  Administered 2024-05-20 (×3): 1000 mL

## 2024-05-20 MED ORDER — TRANEXAMIC ACID-NACL 1000-0.7 MG/100ML-% IV SOLN
INTRAVENOUS | Status: AC
  Filled 2024-05-20: qty 100

## 2024-05-20 MED ORDER — MAGNESIUM CITRATE PO SOLN
1.0000 | Freq: Once | ORAL | Status: AC | PRN
  Administered 2024-05-21: 1 via ORAL
  Filled 2024-05-20: qty 296

## 2024-05-20 MED ORDER — THROMBIN 20000 UNITS EX SOLR
CUTANEOUS | Status: AC
  Filled 2024-05-20: qty 20000

## 2024-05-20 MED ORDER — CEFAZOLIN SODIUM-DEXTROSE 1-4 GM/50ML-% IV SOLN
1.0000 g | Freq: Three times a day (TID) | INTRAVENOUS | Status: AC
  Administered 2024-05-20 – 2024-05-21 (×2): 1 g via INTRAVENOUS
  Filled 2024-05-20 (×2): qty 50

## 2024-05-20 MED ORDER — THROMBIN 20000 UNITS EX SOLR
CUTANEOUS | Status: DC | PRN
  Administered 2024-05-20: 09:00:00 20 mL via TOPICAL

## 2024-05-20 MED ORDER — KETAMINE HCL 50 MG/5ML IJ SOSY
PREFILLED_SYRINGE | INTRAMUSCULAR | Status: AC
  Filled 2024-05-20: qty 5

## 2024-05-20 MED ORDER — MORPHINE SULFATE (PF) 4 MG/ML IV SOLN: 4.0000 mg | INTRAVENOUS | Status: AC | PRN

## 2024-05-20 MED ORDER — FENTANYL CITRATE (PF) 250 MCG/5ML IJ SOLN
INTRAMUSCULAR | Status: DC | PRN
  Administered 2024-05-20: 08:00:00 100 ug via INTRAVENOUS
  Administered 2024-05-20 (×2): 50 ug via INTRAVENOUS

## 2024-05-20 MED ORDER — SODIUM CHLORIDE 0.9 % IV SOLN
0.1500 ug/kg/min | INTRAVENOUS | Status: AC
  Administered 2024-05-20: 08:00:00 .05 ug/kg/min via INTRAVENOUS
  Filled 2024-05-20: qty 2000

## 2024-05-20 MED ORDER — MIDAZOLAM HCL 2 MG/2ML IJ SOLN
INTRAMUSCULAR | Status: AC
  Filled 2024-05-20: qty 2

## 2024-05-20 MED ORDER — SODIUM CHLORIDE 0.9 % IV SOLN
0.1500 ug/kg/min | INTRAVENOUS | Status: DC
  Filled 2024-05-20: qty 2000

## 2024-05-20 MED ORDER — OXYCODONE-ACETAMINOPHEN 10-325 MG PO TABS: 1.0000 | ORAL_TABLET | Freq: Four times a day (QID) | ORAL | 0 refills | Status: AC | PRN

## 2024-05-20 MED ORDER — SODIUM CHLORIDE 0.9 % IV SOLN: INTRAVENOUS | Status: DC | PRN

## 2024-05-20 MED ORDER — LACTATED RINGERS IV SOLN: INTRAVENOUS | Status: DC

## 2024-05-20 MED ORDER — PROPOFOL 10 MG/ML IV BOLUS
INTRAVENOUS | Status: DC | PRN
  Administered 2024-05-20: 12:00:00 50 mg via INTRAVENOUS
  Administered 2024-05-20: 08:00:00 40 mg via INTRAVENOUS
  Administered 2024-05-20: 09:00:00 30 mg via INTRAVENOUS

## 2024-05-20 MED ORDER — KETOROLAC TROMETHAMINE 30 MG/ML IJ SOLN
INTRAMUSCULAR | Status: AC
  Filled 2024-05-20: qty 1

## 2024-05-20 MED ORDER — SURGIFLO WITH THROMBIN (HEMOSTATIC MATRIX KIT) OPTIME
TOPICAL | Status: DC | PRN
  Administered 2024-05-20 (×3): 1 via TOPICAL

## 2024-05-20 MED ORDER — CEFAZOLIN SODIUM-DEXTROSE 2-4 GM/100ML-% IV SOLN
2.0000 g | INTRAVENOUS | Status: AC
  Administered 2024-05-20 (×2): 2 g via INTRAVENOUS
  Filled 2024-05-20: qty 100

## 2024-05-20 MED ORDER — MENTHOL 3 MG MT LOZG: 1.0000 | LOZENGE | OROMUCOSAL | Status: DC | PRN

## 2024-05-20 MED ORDER — ROCURONIUM BROMIDE 10 MG/ML (PF) SYRINGE
PREFILLED_SYRINGE | INTRAVENOUS | Status: AC
  Filled 2024-05-20: qty 10

## 2024-05-20 MED ORDER — CHLORHEXIDINE GLUCONATE CLOTH 2 % EX PADS
6.0000 | MEDICATED_PAD | Freq: Once | CUTANEOUS | Status: AC
  Administered 2024-05-20: 07:00:00 6 via TOPICAL

## 2024-05-20 MED ORDER — SODIUM CHLORIDE 0.9% FLUSH
3.0000 mL | Freq: Two times a day (BID) | INTRAVENOUS | Status: DC
  Administered 2024-05-20 – 2024-05-21 (×2): 3 mL via INTRAVENOUS

## 2024-05-20 MED ORDER — SUGAMMADEX SODIUM 200 MG/2ML IV SOLN
INTRAVENOUS | Status: AC
  Filled 2024-05-20: qty 2

## 2024-05-20 MED ORDER — HYDROMORPHONE HCL 1 MG/ML IJ SOLN
INTRAMUSCULAR | Status: DC | PRN
  Administered 2024-05-20 (×2): .5 mg via INTRAVENOUS

## 2024-05-20 MED ORDER — OXYCODONE HCL 5 MG PO TABS
10.0000 mg | ORAL_TABLET | ORAL | Status: DC | PRN
  Administered 2024-05-20 – 2024-05-22 (×9): 10 mg via ORAL
  Filled 2024-05-20 (×9): qty 2

## 2024-05-20 MED ORDER — POLYETHYLENE GLYCOL 3350 17 G PO PACK: 17.0000 g | PACK | Freq: Every day | ORAL | Status: DC | PRN

## 2024-05-20 MED ORDER — ONDANSETRON HCL 4 MG/2ML IJ SOLN
INTRAMUSCULAR | Status: DC | PRN
  Administered 2024-05-20: 09:00:00 4 mg via INTRAVENOUS

## 2024-05-20 MED ORDER — DIPHENHYDRAMINE HCL 50 MG/ML IJ SOLN
INTRAMUSCULAR | Status: AC
  Filled 2024-05-20: qty 1

## 2024-05-20 MED ORDER — FUROSEMIDE 40 MG PO TABS
40.0000 mg | ORAL_TABLET | Freq: Every day | ORAL | Status: DC
  Administered 2024-05-22: 40 mg via ORAL
  Filled 2024-05-20 (×3): qty 1

## 2024-05-20 MED ORDER — KETAMINE HCL 50 MG/5ML IJ SOSY
PREFILLED_SYRINGE | INTRAMUSCULAR | Status: DC | PRN
  Administered 2024-05-20: 10:00:00 20 mg via INTRAVENOUS
  Administered 2024-05-20: 09:00:00 30 mg via INTRAVENOUS

## 2024-05-20 MED ORDER — SODIUM CHLORIDE 0.9 % IV SOLN
12.5000 mg | INTRAVENOUS | Status: DC | PRN
  Filled 2024-05-20: qty 0.5

## 2024-05-20 MED ORDER — CHLORHEXIDINE GLUCONATE 0.12 % MT SOLN
15.0000 mL | Freq: Once | OROMUCOSAL | Status: AC
  Administered 2024-05-20: 07:00:00 15 mL via OROMUCOSAL
  Filled 2024-05-20: qty 15

## 2024-05-20 MED ORDER — LIDOCAINE 2% (20 MG/ML) 5 ML SYRINGE
INTRAMUSCULAR | Status: AC
  Filled 2024-05-20: qty 5

## 2024-05-20 MED ORDER — ROCURONIUM BROMIDE 10 MG/ML (PF) SYRINGE
PREFILLED_SYRINGE | INTRAVENOUS | Status: DC | PRN
  Administered 2024-05-20: 08:00:00 50 mg via INTRAVENOUS

## 2024-05-20 MED ORDER — CHLORHEXIDINE GLUCONATE CLOTH 2 % EX PADS: 6.0000 | MEDICATED_PAD | Freq: Once | CUTANEOUS | Status: DC

## 2024-05-20 MED ORDER — PHENYLEPHRINE HCL-NACL 20-0.9 MG/250ML-% IV SOLN
INTRAVENOUS | Status: DC | PRN
  Administered 2024-05-20: 08:00:00 30 ug/min via INTRAVENOUS

## 2024-05-20 MED ORDER — BISACODYL 5 MG PO TBEC
5.0000 mg | DELAYED_RELEASE_TABLET | Freq: Every day | ORAL | Status: DC | PRN
  Administered 2024-05-20: 22:00:00 10 mg via ORAL
  Filled 2024-05-20: qty 2

## 2024-05-20 MED ORDER — SODIUM CHLORIDE 0.9% FLUSH: 3.0000 mL | INTRAVENOUS | Status: DC | PRN

## 2024-05-20 MED ORDER — TRANEXAMIC ACID-NACL 1000-0.7 MG/100ML-% IV SOLN
INTRAVENOUS | Status: DC | PRN
  Administered 2024-05-20: 09:00:00 1000 mg via INTRAVENOUS

## 2024-05-20 MED ADMIN — Fentanyl Citrate Preservative Free (PF) Inj 100 MCG/2ML: 50 ug | INTRAVENOUS | @ 15:00:00 | NDC 72572017001

## 2024-05-20 MED ADMIN — Propofol IV Emul 500 MG/50ML (10 MG/ML): 135 ug/kg/min | INTRAVENOUS | @ 08:00:00 | NDC 00069023420

## 2024-05-20 MED FILL — Thrombin (Recombinant) For Soln 20000 Unit: CUTANEOUS | Qty: 20000 | Status: CN

## 2024-05-20 MED FILL — Bupivacaine Inj 0.25% w/ Epinephrine 1:200000 (PF): INTRAMUSCULAR | Qty: 30 | Status: AC

## 2024-05-20 MED FILL — Acetaminophen IV Soln 10 MG/ML: INTRAVENOUS | Qty: 100 | Status: AC

## 2024-05-20 SURGICAL SUPPLY — 101 items
ALLOGRFT BNE OSSIFUSE FBR 10CC (Bone Implant) IMPLANT
ALLOGRFT BNE OSSIFUSE FBR 5CC (Bone Implant) IMPLANT
BAG COUNTER SPONGE SURGICOUNT (BAG) ×6 IMPLANT
BLADE CLIPPER SURG (BLADE) IMPLANT
BLADE SURG 10 STRL SS (BLADE) ×2 IMPLANT
BUR EGG ELITE 4.0 (BURR) ×2 IMPLANT
CABLE BIPOLOR RESECTION CORD (MISCELLANEOUS) ×4 IMPLANT
CANISTER SUCTION 3000ML PPV (SUCTIONS) ×2 IMPLANT
CAP RELINE MOD TULIP RMM (Cap) IMPLANT
CLIP APPLIE 11 MED OPEN (CLIP) ×4 IMPLANT
CLIP LIGATING EXTRA MED SLVR (CLIP) ×2 IMPLANT
CLIP NEUROVISION LG (NEUROSURGERY SUPPLIES) IMPLANT
CLSR STERI-STRIP ANTIMIC 1/2X4 (GAUZE/BANDAGES/DRESSINGS) ×6 IMPLANT
COVER SURGICAL LIGHT HANDLE (MISCELLANEOUS) ×4 IMPLANT
DRAPE C-ARM 42X72 X-RAY (DRAPES) ×6 IMPLANT
DRAPE C-ARMOR (DRAPES) ×4 IMPLANT
DRAPE POUCH INSTRU U-SHP 10X18 (DRAPES) ×4 IMPLANT
DRAPE SURG 17X23 STRL (DRAPES) ×4 IMPLANT
DRAPE U-SHAPE 47X51 STRL (DRAPES) ×4 IMPLANT
DRSG OPSITE POSTOP 4X6 (GAUZE/BANDAGES/DRESSINGS) IMPLANT
DRSG OPSITE POSTOP 4X8 (GAUZE/BANDAGES/DRESSINGS) ×4 IMPLANT
DURAPREP 26ML APPLICATOR (WOUND CARE) ×4 IMPLANT
ELECT BLADE 6.5 EXT (BLADE) IMPLANT
ELECT CAUTERY BLADE 6.4 (BLADE) ×2 IMPLANT
ELECT NVM5 SURFACE MEP/EMG (ELECTRODE) IMPLANT
ELECT PENCIL ROCKER SW 15FT (MISCELLANEOUS) ×4 IMPLANT
ELECTRODE BLDE 4.0 EZ CLN MEGD (MISCELLANEOUS) ×6 IMPLANT
ELECTRODE REM PT RTRN 9FT ADLT (ELECTROSURGICAL) ×4 IMPLANT
GAUZE 4X4 16PLY ~~LOC~~+RFID DBL (SPONGE) IMPLANT
GLOVE BIO SURGEON STRL SZ 6.5 (GLOVE) ×2 IMPLANT
GLOVE BIO SURGEON STRL SZ7.5 (GLOVE) IMPLANT
GLOVE BIO SURGEON STRL SZ8 (GLOVE) ×6 IMPLANT
GLOVE BIOGEL PI IND STRL 6.5 (GLOVE) ×2 IMPLANT
GLOVE BIOGEL PI IND STRL 8.5 (GLOVE) ×6 IMPLANT
GLOVE SS BIOGEL STRL SZ 7.5 (GLOVE) ×2 IMPLANT
GLOVE SS BIOGEL STRL SZ 8.5 (GLOVE) ×6 IMPLANT
GOWN STRL REUS W/ TWL LRG LVL3 (GOWN DISPOSABLE) ×4 IMPLANT
GOWN STRL REUS W/ TWL XL LVL3 (GOWN DISPOSABLE) ×2 IMPLANT
GOWN STRL REUS W/TWL 2XL LVL3 (GOWN DISPOSABLE) ×10 IMPLANT
HEMOSTAT SNOW SURGICEL 2X4 (HEMOSTASIS) IMPLANT
INSERT FOGARTY 61MM (MISCELLANEOUS) IMPLANT
INSERT FOGARTY SM (MISCELLANEOUS) IMPLANT
KIT BASIN OR (CUSTOM PROCEDURE TRAY) ×4 IMPLANT
KIT POSITIONER JACKSON TABLE (MISCELLANEOUS) IMPLANT
KIT TURNOVER KIT B (KITS) ×4 IMPLANT
MODULE EMG NDL SSEP NVM5 (NEUROSURGERY SUPPLIES) IMPLANT
MODULE EMG NEEDLE SSEP NVM5 (NEUROSURGERY SUPPLIES) IMPLANT
NDL 22X1.5 STRL (OR ONLY) (MISCELLANEOUS) ×2 IMPLANT
NDL SPNL 18GX3.5 QUINCKE PK (NEEDLE) ×6 IMPLANT
NEEDLE 22X1.5 STRL (OR ONLY) (MISCELLANEOUS) ×2 IMPLANT
NEEDLE SPNL 18GX3.5 QUINCKE PK (NEEDLE) ×6 IMPLANT
PACK LAMINECTOMY ORTHO (CUSTOM PROCEDURE TRAY) ×4 IMPLANT
PACK UNIVERSAL I (CUSTOM PROCEDURE TRAY) ×4 IMPLANT
PAD ARMBOARD POSITIONER FOAM (MISCELLANEOUS) ×12 IMPLANT
PATTIES SURGICAL .5 X.5 (GAUZE/BANDAGES/DRESSINGS) IMPLANT
PATTIES SURGICAL .5 X1 (DISPOSABLE) ×2 IMPLANT
PENCIL SMOKE EVACUATOR (MISCELLANEOUS) ×2 IMPLANT
POSITIONER HEAD PRONE TRACH (MISCELLANEOUS) ×2 IMPLANT
PROBE BALL TIP NVM5 SNG USE (NEUROSURGERY SUPPLIES) IMPLANT
ROD RELINE COCR LORD 5X40MM (Rod) IMPLANT
SCREW ANT CERV SD IND 5.5X25 (Screw) IMPLANT
SCREW LOCK RSS 4.5/5.0MM (Screw) IMPLANT
SCREW SHANK RELINE MOD 6.5X35 (Screw) IMPLANT
SHANK RELINE MOD 5.5X40 (Screw) IMPLANT
SOLN 0.9% NACL POUR BTL 1000ML (IV SOLUTION) ×4 IMPLANT
SOLN STERILE WATER BTL 1000 ML (IV SOLUTION) ×4 IMPLANT
SPACER ALIF MAG 26X34X10 15D (Spacer) IMPLANT
SPONGE INTESTINAL PEANUT (DISPOSABLE) ×6 IMPLANT
SPONGE SURGIFOAM ABS GEL 100 (HEMOSTASIS) ×2 IMPLANT
SPONGE T-LAP 18X18 ~~LOC~~+RFID (SPONGE) ×2 IMPLANT
SPONGE T-LAP 4X18 ~~LOC~~+RFID (SPONGE) ×8 IMPLANT
STAPLER SKIN PROX 35W (STAPLE) ×2 IMPLANT
SUPPORT INTRAOP PULSE NCS W/E (MISCELLANEOUS) IMPLANT
SURGIFLO W/THROMBIN 8M KIT (HEMOSTASIS) ×2 IMPLANT
SUT BONE WAX W31G (SUTURE) ×4 IMPLANT
SUT MNCRL AB 3-0 PS2 27 (SUTURE) ×6 IMPLANT
SUT MNCRL+ AB 3-0 CT1 36 (SUTURE) ×2 IMPLANT
SUT PDS AB 1 CTX 36 (SUTURE) ×4 IMPLANT
SUT PROLENE 4-0 RB1 .5 CRCL 36 (SUTURE) IMPLANT
SUT PROLENE 5 0 C 1 24 (SUTURE) ×2 IMPLANT
SUT PROLENE 5 0 CC1 (SUTURE) IMPLANT
SUT PROLENE 6 0 BV (SUTURE) ×4 IMPLANT
SUT PROLENE 6 0 C 1 30 (SUTURE) IMPLANT
SUT SILK 0 TIES 10X30 (SUTURE) ×2 IMPLANT
SUT SILK 2 0 TIES 10X30 (SUTURE) ×4 IMPLANT
SUT SILK 2 0SH CR/8 30 (SUTURE) IMPLANT
SUT SILK 3 0 TIES 10X30 (SUTURE) ×4 IMPLANT
SUT SILK 3 0SH CR/8 30 (SUTURE) IMPLANT
SUT STRATAFIX 1PDS 45CM VIOLET (SUTURE) IMPLANT
SUT VIC AB 0 CT1 27XBRD ANBCTR (SUTURE) IMPLANT
SUT VIC AB 1 CT1 18XCR BRD 8 (SUTURE) ×2 IMPLANT
SUT VIC AB 1 CT1 27XBRD ANBCTR (SUTURE) ×4 IMPLANT
SUT VIC AB 2-0 CT1 18 (SUTURE) ×4 IMPLANT
SUT VIC AB 2-0 CT1 TAPERPNT 27 (SUTURE) IMPLANT
SYR BULB IRRIG 60ML STRL (SYRINGE) ×4 IMPLANT
SYR CONTROL 10ML LL (SYRINGE) ×2 IMPLANT
TOWEL GREEN STERILE (TOWEL DISPOSABLE) ×6 IMPLANT
TOWEL GREEN STERILE FF (TOWEL DISPOSABLE) ×4 IMPLANT
TRAY FOLEY MTR SLVR 14FR STAT (SET/KITS/TRAYS/PACK) IMPLANT
TRAY FOLEY MTR SLVR 16FR STAT (SET/KITS/TRAYS/PACK) ×4 IMPLANT
YANKAUER SUCT BULB TIP NO VENT (SUCTIONS) ×2 IMPLANT

## 2024-05-20 NOTE — Discharge Instructions (Signed)

## 2024-05-20 NOTE — Op Note (Signed)
 DATE OF SERVICE: 05/20/2024  PATIENT:  Kelsey Morgan  53 y.o. female  PRE-OPERATIVE DIAGNOSIS:  back pain with radiculopathy  POST-OPERATIVE DIAGNOSIS:  Same  PROCEDURE:   Anterior exposure of L5/S1 disc space  SURGEON:  Surgeons and Role: Panel 1:    DEWAINE Burnetta Aures, MD - Primary Panel 2:    * Magda Debby SAILOR, MD - Primary  ASSISTANT: Amber, PA-C  An experienced assistant was required given the complexity of this procedure and the standard of surgical care. My assistant helped with exposure through counter tension, suctioning, ligation and retraction to better visualize the surgical field.  My assistant expedited sewing during the case by following my sutures. Wherever I use the term we in the report, my assistant actively helped me with that portion of the procedure.  ANESTHESIA:   general  EBL:  BLOOD ADMINISTERED:none  DRAINS: none   LOCAL MEDICATIONS USED:  NONE  SPECIMEN:  none  COUNTS: confirmed correct.  TOURNIQUET:  none  PATIENT DISPOSITION:  PACU - hemodynamically stable.   Delay start of Pharmacological VTE agent (>24hrs) due to surgical blood loss or risk of bleeding: no  INDICATION FOR PROCEDURE: Kelsey Morgan is a 53 y.o. female with back pain with radiculopathy. She was evaluated by Dr. Burnetta and recommended to undergo L5/S1 ALIF. After careful discussion of risks, benefits, and alternatives the patient was offered anterior exposure. The patient understood and wished to proceed.  OPERATIVE FINDINGS: unremarkable exposure of L5/S1.  DESCRIPTION OF PROCEDURE: After identification of the patient in the pre-operative holding area, the patient was transferred to the operating room. The patient was positioned supine on the operating room table. Anesthesia was induced. The abdomen was prepped and draped in standard fashion. A surgical pause was performed confirming correct patient, procedure, and operative location.  Intraoperative fluoroscopy  was used to mark the skin over the L5/S1 disc space.  A transverse incision was planned over the left lower quadrant of the abdomen just above the pubic bone.  The incision was made and carried down through subtenons tissue until the anterior rectus fascia was encountered.  This was divided longitudinally.  Retroperitoneal plane was entered bluntly and the peritoneal envelope swept lateral to medial.  Identified the iliopsoas muscle and followed this down to the spine.  Vascular structures were identified and swept out of the exposure.  The disc base was identified and blunt dissection was carried over the anterior surface of the L5/S1 disc base.  The middle sacral vessels were identified, clipped, coagulated, and divided.  NuVasive self-retaining ALIF exposure set was performed in the field and positioned to hold the exposure.  I introduced a needle into the L5/S1 disc base.  Fluoroscopy confirmed correct level.  At this point the case was turned over to Dr. Burnetta for spinal surgery.  Debby SAILOR. Magda, MD [facility] Phone Number: [phone] 05/20/2024 9:47 AM

## 2024-05-20 NOTE — Interval H&P Note (Signed)
 History and Physical Interval Note:  05/20/2024 7:19 AM  Kelsey Morgan  has presented today for surgery, with the diagnosis of degenerative disc disease with radiculopathy L5-S1.  The various methods of treatment have been discussed with the patient and family. After consideration of risks, benefits and other options for treatment, the patient has consented to  Procedures with comments: ANTERIOR LUMBAR FUSION 1 LEVEL (N/A) - Anterior lumbar interbody fusion  L5-S1 with posterior spinal fusion L5-S1 POSTERIOR LUMBAR FUSION 1 LEVEL (N/A) ABDOMINAL EXPOSURE (N/A) as a surgical intervention.  The patient's history has been reviewed, patient examined, no change in status, stable for surgery.  I have reviewed the patient's chart and labs.  Questions were answered to the patient's satisfaction.     Debby LOISE Robertson

## 2024-05-20 NOTE — Anesthesia Postprocedure Evaluation (Signed)
 Anesthesia Post Note  Patient: Kelsey Morgan  Procedure(s) Performed: ANTERIOR LUMBAR INTERBODY FUSION OF LUMBAR FIVE TO SCARAL ONE (Spine Lumbar) POSTERIOR LUMBAR INTERBODY FUSION OF LUMBAR FIVE TO SACRAL ONE (Spine Lumbar) ABDOMINAL EXPOSURE FOR ANTERIOR LUMBAR SPINE SURGERY (Abdomen)     Patient location during evaluation: PACU Anesthesia Type: General Level of consciousness: awake and alert and oriented Pain management: pain level controlled Vital Signs Assessment: post-procedure vital signs reviewed and stable Respiratory status: spontaneous breathing, nonlabored ventilation and respiratory function stable Cardiovascular status: blood pressure returned to baseline and stable Postop Assessment: no apparent nausea or vomiting Anesthetic complications: no   No notable events documented.  Last Vitals:  Vitals:   05/20/24 1500 05/20/24 1515  BP: (!) 187/78 (!) 183/80  Pulse: 73 61  Resp: 16 13  Temp:    SpO2: 95% 93%    Last Pain:  Vitals:   05/20/24 1500  TempSrc:   PainSc: 8    Pain Goal: Patients Stated Pain Goal: 5 (05/20/24 0645)                 Quinzell Malcomb A.

## 2024-05-20 NOTE — H&P (Signed)
 History:  The patient is a 53 year old female who presents for pre-operative visit in preparation for their ALIF L5-S1 with PSFI L5-S1, which is scheduled on 05/20/24 [facility]. The patient has had symptoms in the Back including pain which has impacted their quality of life and ability to do activities of daily living. The patient currently has a diagnosis of lumbar radiculopathy and has failed conservative treatments including activity modification, NSAIDS, pain mgmt, and PT. The patient denies an active infection.   Past Medical History:  Diagnosis Date   Arthritis    Back pain    Bronchitis    CHF (congestive heart failure) (HCC)    Chronic back pain    Gout    High cholesterol    Joint pain    Pneumonia    Pre-diabetes    Sleep apnea     Allergies[1]  Medications Ordered Prior to Encounter[2]  Physical Exam: Vitals:   05/20/24 0554  BP: 111/74  Pulse: 60  Resp: 17  Temp: 98.5 F (36.9 C)  SpO2: 96%   Body mass index is 36.25 kg/m. Clinical exam: Kelsey Morgan is a pleasant individual, who appears younger than their stated age.   She is alert and orientated 3.   No shortness of breath, chest pain.   Abdomen is soft and non-tender, negative loss of bowel and bladder control, no rebound tenderness.   Negative: skin lesions abrasions contusions  Peripheral pulses: 2+ peripheral pulses bilaterally.  LE compartments are: Soft and nontender.  Gait pattern: Altered gait pattern due to significant back and intermittent radicular leg pain  Assistive devices: None  Neuro: 5/5 motor strength in the lower extremity bilaterally. Positive numbness and dysesthesias predominantly in the L5 and S1 dermatome. Negative straight leg raise test, no clonus, and negative Babinski test. 1+ symmetrical deep tendon reflexes  Musculoskeletal: Significant back pain with palpation and attempts at range of motion. Pain is midline in the lower lumbar region radiating into  the paraspinal region. No SI joint pain.   Imaging: x-rays of the lumbar spine demonstrate degenerative lumbar disc disease L5-S1. No scoliosis or spondylolisthesis.  Lumbar MRI: completed on 02/07/2024: Severe degenerative disc disease L5-S1 with Modic endplate type II changes. Moderate to severe foraminal narrowing is noted. Mild degenerative changes L2-5 but no significant stenosis. No fracture is seen.  A/P:  Patient has failed conservative management, therefore we have elected to proceed with the following surgical procedure ALIF L5-S1 with PSFI L5-S1 on 05/20/24 at [facility]   The patient has not had any improvement of their symptoms with conservative treatment measures. Lind has tried formal PT for the lumbar spine, activity modification and pain medical management and did not have any improvement in her symptoms.  On exam she has 5/5 motor strength in the lower extremity bilaterally. Positive numbness and dysesthesias predominantly in the L5 and S1 dermatome. Negative straight leg raise test, no clonus, and negative Babinski test. 1+ symmetrical deep tendon reflexes  Imaging: x-rays of the lumbar spine demonstrate degenerative lumbar disc disease L5-S1. No scoliosis or spondylolisthesis.  Lumbar MRI: completed on 02/07/2024: Severe degenerative disc disease L5-S1 with Modic endplate type II changes. Moderate to severe foraminal narrowing is noted. Mild degenerative changes L2-5 but no significant stenosis. No fracture is seen.  Based on clinical exam findings and imaging studies we do believe that surgery is warranted at this time in order to decrease pain and loss of quality of life.  The surgical intervention  that we would recommend would be ALIF L5-S1 with PSFI L5-S1 The risks of surgery were discussed today with the patient, below are the surgical risks we did discuss. Risks and benefits of surgery were discussed with the patient. These include: Infection, bleeding, death, stroke,  paralysis, ongoing or worse pain, need for additional surgery, nonunion, leak of spinal fluid, adjacent segment degeneration requiring additional fusion surgery, need for posterior decompression and/or fusion. Bleeding from major vessels, and blood clots (deep venous thrombosis)requiring additional treatment. Due to the abdominal contents requiring further intervention, loss in bowel and bladder control. Additional risk for female patients: Retrograde ejaculation, and therefore infertility.  Goal of surgery: Reduced (not eliminated) pain and therefore improved quality of life.  Diagnosis: Degenerative disc disease and radiculopathy of L5-S1  Post-operative care plans were discussed with the patient today and all patient questions were answered.     [1]  Allergies Allergen Reactions   Atorvastatin  Calcium      Other Reaction(s): cramps   Simvastatin     Other Reaction(s): cramps, Not available   Hydrocodone  Itching   Percocet [Oxycodone -Acetaminophen ] Itching   Vicodin [Hydrocodone -Acetaminophen ] Itching    Tolerates with Benadryl . Tolerates acetaminophen  alone.  [2]  No current facility-administered medications on file prior to encounter.   Current Outpatient Medications on File Prior to Encounter  Medication Sig Dispense Refill   celecoxib  (CELEBREX ) 200 MG capsule Take 200 mg by mouth daily.     empagliflozin  (JARDIANCE ) 10 MG TABS tablet Take 1 tablet (10 mg total) by mouth daily before breakfast. 90 tablet 3   furosemide  (LASIX ) 40 MG tablet Take 1 tablet (40 mg total) by mouth daily. 30 tablet 2   gabapentin  (NEURONTIN ) 300 MG capsule TAKE 1 CAPSULE(300 MG) BY MOUTH THREE TIMES DAILY 90 capsule 3   MOUNJARO 5 MG/0.5ML Pen Inject 5 mg into the skin once a week.     nicotine  polacrilex (NICORETTE ) 4 MG gum Take 1 each (4 mg total) by mouth as needed for smoking cessation. 100 tablet 0   rosuvastatin  (CRESTOR ) 20 MG tablet Take 1 tablet (20 mg total) by mouth daily. 90 tablet 3    sertraline  (ZOLOFT ) 100 MG tablet Take 150 mg by mouth at bedtime.     tiZANidine  (ZANAFLEX ) 4 MG tablet TAKE 1 TABLET(4 MG) BY MOUTH THREE TIMES DAILY AS NEEDED FOR MUSCLE SPASMS (Patient taking differently: Take 4-8 mg by mouth at bedtime.) 60 tablet 2   traMADol  (ULTRAM ) 50 MG tablet Take 50 mg by mouth every 6 (six) hours as needed for moderate pain (pain score 4-6).     Accu-Chek Softclix Lancets lancets daily.     albuterol  (PROVENTIL ) (2.5 MG/3ML) 0.083% nebulizer solution Take 3 mLs (2.5 mg total) by nebulization every 4 (four) hours while awake for 3 days, THEN 3 mLs (2.5 mg total) every 4 (four) hours as needed for wheezing or shortness of breath. Take your nebulizer OR your inhaler. (Patient not taking: No sig reported) 75 mL 2   albuterol  (VENTOLIN  HFA) 108 (90 Base) MCG/ACT inhaler Inhale 2 puffs into the lungs every 4 (four) hours while awake for 3 days, THEN 2 puffs every 4 (four) hours as needed for wheezing or shortness of breath. Take your inhaler OR your nebulizer.. 8 g 2   aspirin  81 MG chewable tablet Chew 81 mg by mouth daily.     benzonatate  (TESSALON ) 200 MG capsule Take 1 capsule (200 mg total) by mouth 3 (three) times daily as needed for cough. (Patient not taking:  No sig reported) 20 capsule 0   Blood Glucose Monitoring Suppl (ONETOUCH VERIO FLEX SYSTEM) w/Device KIT SMARTSIG:injection Daily     diclofenac  Sodium (VOLTAREN ) 1 % GEL Apply 1 application topically 4 (four) times daily. Bilateral knees (Patient not taking: Reported on 05/19/2024) 350 g 4   fluticasone  (FLONASE ) 50 MCG/ACT nasal spray Place 2 sprays into both nostrils daily as needed for allergies.     methocarbamol  (ROBAXIN ) 500 MG tablet Take 1 tablet (500 mg total) by mouth every 8 (eight) hours as needed for muscle spasms. (Patient not taking: No sig reported) 60 tablet 2   Multiple Vitamin (MULTIVITAMIN WITH MINERALS) TABS tablet Take 1 tablet by mouth daily.     ONETOUCH VERIO test strip SMARTSIG:Via Meter      polyethylene glycol-electrolytes (NULYTELY ) 420 g solution as directed.     potassium chloride  SA (K-DUR,KLOR-CON ) 20 MEQ tablet Take 1 tablet (20 mEq total) by mouth daily. 90 tablet 3

## 2024-05-20 NOTE — Anesthesia Procedure Notes (Signed)
 Procedure Name: Intubation Date/Time: 05/20/2024 8:15 AM  Performed by: Lucious Debby BRAVO, MDPre-anesthesia Checklist: Patient identified, Emergency Drugs available, Suction available, Patient being monitored and Timeout performed Patient Re-evaluated:Patient Re-evaluated prior to induction Oxygen Delivery Method: Ambu bag Preoxygenation: Pre-oxygenation with 100% oxygen Induction Type: IV induction, Rapid sequence and Cricoid Pressure applied Ventilation: Mask ventilation without difficulty Laryngoscope Size: Glidescope and 3 Grade View: Grade I Tube type: Subglottic suction tube Tube size: 7.0 mm Number of attempts: 2 Airway Equipment and Method: Video-laryngoscopy and Rigid stylet Placement Confirmation: ETT inserted through vocal cords under direct vision, breath sounds checked- equal and bilateral and CO2 detector Secured at: 23 cm Tube secured with: Tape Dental Injury: Teeth and Oropharynx as per pre-operative assessment  Comments: First attempt with Mac 3 by CRNA, grade 3 view. Two front teeth noted to be incredibly loose and rotten. 2nd attempt with glidescope 3, grade 1 view, easily passed ETT successfully. One of front teeth came out following the intubation. Tooth recovered, not swallowed. Suspect patient would be easy to DL, save for dentition, with better sniffing position in future.

## 2024-05-20 NOTE — OR Nursing (Signed)
 Dr. Landy, radiologist, called into room 4 to confirm that we are working at the lumbar five to sacral one disc space and there are no retained objects in the patient.

## 2024-05-20 NOTE — Brief Op Note (Signed)
 02/05/2024   10:23 AM   PATIENT:  05/20/2024  1:39 PM  PATIENT:  Kelsey Morgan  53 y.o. female  PRE-OPERATIVE DIAGNOSIS:  degenerative disc disease with radiculopathy L5-S1  POST-OPERATIVE DIAGNOSIS:  degenerative disc disease with radiculopathy L5-S1  PROCEDURE:  Procedures: ANTERIOR LUMBAR INTERBODY FUSION OF LUMBAR FIVE TO SCARAL ONE (N/A) POSTERIOR LUMBAR INTERBODY FUSION OF LUMBAR FIVE TO SACRAL ONE (N/A) ABDOMINAL EXPOSURE FOR ANTERIOR LUMBAR SPINE SURGERY (N/A)  SURGEON:  Surgeons and Role: Panel 1:    DEWAINE Burnetta Aures, MD - Primary Panel 2:    * Magda Debby SAILOR, MD - Primary  PHYSICIAN ASSISTANT: Jeoffrey Sages, PA  ANESTHESIA:   general  EBL:  250 mL   BLOOD ADMINISTERED:none  DRAINS: none   LOCAL MEDICATIONS USED:  MARCAINE      SPECIMEN:  No Specimen  DISPOSITION OF SPECIMEN:  N/A  COUNTS:  YES  TOURNIQUET:  * No tourniquets in log *  DICTATION: .Dragon Dictation  PLAN OF CARE: Admit to inpatient   PATIENT DISPOSITION:  PACU - hemodynamically stable.

## 2024-05-20 NOTE — Op Note (Signed)
 OPERATIVE REPORT  DATE OF SURGERY: 05/20/2024  PATIENT NAME:  Kelsey Morgan MRN: [id number] DOB: [birth year]  PCP: Sun, Vyvyan, MD  PRE-OPERATIVE DIAGNOSIS: Lumbar disc disease L5-S1.  With foraminal stenosis and radiculopathy  POST-OPERATIVE DIAGNOSIS: Same  PROCEDURE:   1.  Anterior lumbar interbody fusion L5-S1. 2.  Posterior segmental instrumentation L5-S1. 3.  Posterior lateral arthrodesis L5-S1.  SURGEON:  Donaciano Sprang, MD  PHYSICIAN ASSISTANT: Jeoffrey Sages, [place]  Approach Surgeon: Dr. Charlena Gelineau  ANESTHESIA:   General  EBL: 250 ml   Complications: None  Implants: Globus expandable anterior intervertebral cage.  10 x 36 x 2615 degree lordosis.  Expanded to anterior height of approximately 12.5 mm. NuVasive posterior cortical pedicle screws.  L5: 5.5 x 40 mm length.  S1: 6.5 x 35 mm length.  40 mm locking rod on both sides.  Graft: ossifuse  Neuromonitoring: All 4 pedicle screws were directly stimulated and there was no adverse activity to suggest breach.  L5: No activity greater than 40 mA bilaterally.  Left S1: Positive activity at 25 mA.  Right S1: Positive activity at 23 mA.  No adverse free running EMG or SSEP activity throughout the case.  BRIEF HISTORY: Kelsey Morgan is a 53 y.o. female who has had progressive debilitating back buttock and neuropathic leg pain.  Attempts at conservative management had failed to alleviate her pain or improve her quality of life.  As a result we elected to move forward with surgery.  Risks, benefits, alternatives were discussed and consent was obtained.  PROCEDURE DETAILS: Patient was brought into the operating room and was properly positioned on the operating room table.  After induction with general anesthesia the patient was endotracheally intubated.  A timeout was taken to confirm all important data: including patient, procedure, and the level. Teds, SCD's were applied.   Foley was placed by the nurse and the  neuromonitoring rep placed all appropriate needles for intraoperative SSEP and free running EMG monitoring.  The patient was now supine on the operating table and the anterior abdomen was prepped and draped in a standard fashion.  Using fluoroscopy we marked out the L5-S1 level.  At this point Dr. Gelineau performed a standard anterior retroperitoneal approach to the lumbar spine.  Please refer to his dictation for specifics on the approach.  Once he had properly positioned all the retractors he scrubbed out and I continued on with the surgery.  I confirmed the proper level using fluoroscopy.  An annulotomy was then performed and using pituitary rongeurs and curettes I removed the bulk of the disc material.  I continued using smaller curettes to remove the disc posteriorly until I was at the posterior margin of the vertebral body.  I then used Kerrison rongeurs to remove the posterior osteophytes from the vertebral body and the posterior annulus.  I then placed a small curved curette behind the vertebral body of S1 and released the annulus across the width of the vertebral body.  I also did this behind the vertebral body of L5.  I confirmed proper positioning of this curved curette with fluoroscopy.  Then using live fluoroscopy I confirmed I had parallel endplate distraction.  I then used my trialing device and elected to use the 15 degree lordotic implant as it provided the best overall contact and restoration of her lumbar lordosis.  The cage was obtained and packed with bone graft.  The wound was then irrigated copiously normal saline and the endplates were rasped to remove any remaining  cartilaginous material and expose bleeding subchondral bone.  After final irrigation I then inserted the cage and I gently push the cage posteriorly.  Once it came to rest near the posterior wall of the vertebral body I confirmed satisfactory positioning using fluoroscopy.  Once confirmed I then began expanding the cage to its  final height of approximately 12.5 mm.  I had excellent restoration of the disc height and indirect decompression of the foramen.  I then removed the inserting device and placed a locking screw through the cage into the body of L5 and a second locking screw through the cage and into the body of S1.  Both of the screws had excellent purchase.  I then engaged the locking device to prevent backout.  I then backfilled the cage with additional bone graft.  At this point I irrigated the wound again and placed Floseal along the iliac vessels and then sequentially removed the retractors.  There was no bleeding noted and hemostasis was firmed.  After all of the retractors were removed I then closed the fascia of the rectus with a #1 strata fix and oversewed this with interrupted #1 Vicryl sutures.  The wound was irrigated again and closed in a layered fashion with 0 Vicryl sutures, followed by interrupted 2-0 Vicryl sutures, and finally a 3-0 Monocryl for the skin.  Prior to placing the 3-0 Monocryl we did take an AP x-ray which was read by the radiologist confirming there were no retained surgical instruments in the field.  Once the x-ray was read we finished the closure.  Dry dressings were then applied.  The drapes were now removed and the arms were tucked at the side.  The [facility] spine frame was then secured on top of the patient.  Once the patient was properly secured we then rotated the frame into the prone position.  The flat Leonce frame was now removed and the arms were placed in a 90-90 position and all bony prominences well-padded.  The lumbar spine was now prepped and draped in a standard fashion.  Another timeout was taken to confirm patient, procedure, and all other important data.  Using fluoroscopy I marked out the inferior border of the L5 and S1 pedicles.  I then marked my midline incision and infiltrated with quarter percent Marcaine  with epinephrine .  The midline incision was made and sharp  dissection was carried out down to the deep fascia.  The deep fascia was sharply incised and using a Cobb elevator I stripped the paraspinal muscles to expose the L5 and S1 spinous process lamina and the L4-5, and L5-S1 facet complexes.  This was done bilaterally.  Once I had the posterior exposure I then confirmed the L5 pedicle as well as the L5-S1 facet complex with fluoroscopy.  I removed the facet capsule at L5-S1 bilaterally.  The pedicle awl was then placed on the inferior medial border of the L5 pedicle and I confirmed this with fluoroscopy.  I then advanced the pedicle awl aiming towards the superior lateral border of the pedicle.  As I was nearing the lateral third of the pedicle I switched to the lateral view and confirmed that I was just beyond the posterior wall of the vertebral body.  Once I had a satisfactory trajectory I advanced into the vertebral body.  I removed the pedicle awl and palpated the hole with a ball-tipped feeler to ensure its integrity.  Once this was ensured I then placed tap to a depth of 40 mm.  I  then repalpated the hole to confirm its integrity.  The 5.5X40 mm length screw was then placed.  It had excellent purchase.  Using this exact same technique I placed the contralateral L5 screw.  Once both screws were properly position I then visualized the S1 pedicle in the AP view.  I then placed my pedicle awl on the medial border of the pedicle advancing towards the lateral side.  As I was in the lateral third of the pedicle I confirmed that it was just beyond the posterior wall of the vertebral body.  Once I confirmed trajectory I advanced into the vertebral body.  I then palpated the hole with a ball-tipped feeler to ensure that it was intact.  I then placed a 6.5 mm tap to a depth of 35 mm.  Once it was removed I palpated the hole again and it was intact.  The 6.5 x 35 mm length screw was then placed and had excellent purchase.  Using the same technique I placed the contralateral S1  screw.  With all 4 screws properly position imaging was taken to confirm satisfactory position.  I then directly stimulated all 4 screws and there was no activity on the EMG to suggest breach and neural irritation.    A high-speed bur was then used to decorticate the L5-S1 facet complex as well as the transverse process of L5 and lateral border of the L4-5 facet bilaterally.  The resulting autograft was then mixed with the allograft and placed in the posterior lateral gutter.  The polyaxial heads were then secured over the screws and the appropriate size rod was inserted.  The locking caps were then placed and tightened according to manufacturer standards.  Once the pedicle screw rod construct was secured I irrigated the wound copiously with normal saline.  I removed all of the retractors and took my final images.  Both the AP and lateral planes had satisfactory positioning of the hardware and there was no movement of the ALIF cage.    The deep fascia was then closed with interrupted #1 Vicryl sutures followed by interrupted 2-0 Vicryl sutures and finally a 3-0 Monocryl for the skin.  Steri-Strips and dry dressings were applied and the patient was ultimately extubated and transferred to PACU without incident.  At the end of the case all needle sponge counts were correct.  There were no adverse intraoperative events.  Donaciano Sprang, MD 05/20/2024 1:23 PM

## 2024-05-20 NOTE — Transfer of Care (Signed)
 Immediate Anesthesia Transfer of Care Note  Patient: Kelsey Morgan  Procedure(s) Performed: ANTERIOR LUMBAR INTERBODY FUSION OF LUMBAR FIVE TO SCARAL ONE (Spine Lumbar) POSTERIOR LUMBAR INTERBODY FUSION OF LUMBAR FIVE TO SACRAL ONE (Spine Lumbar) ABDOMINAL EXPOSURE FOR ANTERIOR LUMBAR SPINE SURGERY (Abdomen)  Patient Location: PACU  Anesthesia Type:General  Level of Consciousness: awake, drowsy, patient cooperative, and responds to stimulation  Airway & Oxygen Therapy: Patient Spontanous Breathing and Patient connected to face mask oxygen  Post-op Assessment: Report given to RN, Post -op Vital signs reviewed and stable, and Patient moving all extremities X 4  Post vital signs: Reviewed and stable  Last Vitals:  Vitals Value Taken Time  BP 156/93 05/20/24 14:03  Temp    Pulse 86 05/20/24 14:12  Resp 26 05/20/24 14:12  SpO2 90 % 05/20/24 14:12  Vitals shown include unfiled device data.  Last Pain:  Vitals:   05/20/24 0645  TempSrc:   PainSc: 8       Patients Stated Pain Goal: 5 (05/20/24 0645)  Complications: No notable events documented.

## 2024-05-21 ENCOUNTER — Encounter (HOSPITAL_COMMUNITY): Payer: Self-pay | Admitting: Orthopedic Surgery

## 2024-05-21 ENCOUNTER — Observation Stay (HOSPITAL_COMMUNITY)

## 2024-05-21 DIAGNOSIS — M5136 Other intervertebral disc degeneration, lumbar region with discogenic back pain only: Secondary | ICD-10-CM | POA: Diagnosis not present

## 2024-05-21 DIAGNOSIS — Z4889 Encounter for other specified surgical aftercare: Secondary | ICD-10-CM

## 2024-05-21 LAB — GLUCOSE, CAPILLARY
Glucose-Capillary: 127 mg/dL — ABNORMAL HIGH (ref 70–99)
Glucose-Capillary: 99 mg/dL (ref 70–99)
Glucose-Capillary: 115 mg/dL — ABNORMAL HIGH (ref 70–99)
Glucose-Capillary: 110 mg/dL — ABNORMAL HIGH (ref 70–99)

## 2024-05-21 LAB — HEMOGLOBIN A1C
Hgb A1c MFr Bld: 5.7 % — ABNORMAL HIGH (ref 4.8–5.6)
Mean Plasma Glucose: 116.89 mg/dL

## 2024-05-21 MED ORDER — ACETAMINOPHEN 325 MG PO TABS
650.0000 mg | ORAL_TABLET | Freq: Four times a day (QID) | ORAL | Status: DC | PRN
  Administered 2024-05-21 (×2): 650 mg via ORAL
  Filled 2024-05-21 (×2): qty 2

## 2024-05-21 MED ORDER — SENNOSIDES-DOCUSATE SODIUM 8.6-50 MG PO TABS
1.0000 | ORAL_TABLET | Freq: Two times a day (BID) | ORAL | Status: DC
  Administered 2024-05-21 – 2024-05-22 (×2): 1 via ORAL
  Filled 2024-05-21 (×3): qty 1

## 2024-05-21 MED FILL — Heparin Sodium (Porcine) Inj 1000 Unit/ML: INTRAMUSCULAR | Qty: 30 | Status: AC

## 2024-05-21 MED FILL — Sodium Chloride IV Soln 0.9%: INTRAVENOUS | Qty: 1000 | Status: AC

## 2024-05-21 NOTE — Evaluation (Signed)
 Occupational Therapy Evaluation Patient Details Name: Kelsey Morgan MRN: [id number] DOB: 07-22-1970 Today's Date: 05/21/2024   History of Present Illness   Kelsey Morgan is a very pleasant 53 year old female who is POD 1 from ALIF L5-S1 with PSFI L5-S1. PMHx: arthritis, back pain, CHF, gout, high cholesterol, PNA, sleep apnea     Clinical Impressions Kelsey Morgan was evaluated s/p the above admission list. She is indep at baseline. Upon evaluation the pt was limited by spinal precautions, surgical pain, limited BLE ROM and decreased activity tolerance. Overall she needed CGA for mobility without AD and was reaching for external support. Due to the deficits listed below the pt also needs min A for ADLs to maintain spinal precautions. Pt will benefit from continued acute OT services and support from family, no follow up OT needed.       If plan is discharge home, recommend the following:   A little help with walking and/or transfers;A little help with bathing/dressing/bathroom;Assistance with cooking/housework;Assist for transportation     Functional Status Assessment   Patient has had a recent decline in their functional status and demonstrates the ability to make significant improvements in function in a reasonable and predictable amount of time.     Equipment Recommendations   BSC/3in1;Other (comment) (RW)      Precautions/Restrictions   Precautions Precautions: Fall;Back Precaution Booklet Issued: Yes (comment) Recall of Precautions/Restrictions: Intact Required Braces or Orthoses: Spinal Brace Spinal Brace: Lumbar corset;Applied in sitting position Restrictions Weight Bearing Restrictions Per Provider Order: No     Mobility Bed Mobility Overal bed mobility: Modified Independent             General bed mobility comments: log roll, incr time    Transfers Overall transfer level: Needs assistance   Transfers: Sit to/from Stand Sit to Stand: Contact guard  assist                  Balance Overall balance assessment: Needs assistance Sitting-balance support: Feet supported Sitting balance-Leahy Scale: Good     Standing balance support: Single extremity supported, During functional activity Standing balance-Leahy Scale: Fair Standing balance comment: pt reaching out for external support         ADL either performed or assessed with clinical judgement   ADL Overall ADL's : Needs assistance/impaired Eating/Feeding: Independent   Grooming: Supervision/safety;Standing   Upper Body Bathing: Set up;Sitting   Lower Body Bathing: Contact guard assist;Sit to/from stand   Upper Body Dressing : Set up;Sitting   Lower Body Dressing: Minimal assistance;Sit to/from stand   Toilet Transfer: Contact guard assist;Ambulation   Toileting- Clothing Manipulation and Hygiene: Modified independent;Sitting/lateral lean       Functional mobility during ADLs: Contact guard assist General ADL Comments: needs assist to don LB clothing, increased pain with figure four position     Vision Baseline Vision/History: 0 No visual deficits Vision Assessment?: No apparent visual deficits     Perception Perception: Within Functional Limits       Praxis Praxis: WFL       Pertinent Vitals/Pain Pain Assessment Pain Assessment: Faces Faces Pain Scale: Hurts even more Pain Location: back Pain Descriptors / Indicators: Discomfort Pain Intervention(s): Limited activity within patient's tolerance, Monitored during session, RN gave pain meds during session     Extremity/Trunk Assessment Upper Extremity Assessment Upper Extremity Assessment: Overall WFL for tasks assessed   Lower Extremity Assessment Lower Extremity Assessment: Defer to PT evaluation   Cervical / Trunk Assessment Cervical / Trunk Assessment: Back Surgery  Communication Communication Communication: No apparent difficulties   Cognition Arousal: Alert Behavior During  Therapy: WFL for tasks assessed/performed Cognition: No apparent impairments           Following commands: Intact       Cueing  General Comments   Cueing Techniques: Verbal cues  VSS           Home Living Family/patient expects to be discharged to:: Private residence Living Arrangements: Spouse/significant other Available Help at Discharge: Family;Available PRN/intermittently Type of Home: Apartment Home Access: Stairs to enter Entrance Stairs-Number of Steps: 14 Entrance Stairs-Rails: Right Home Layout: One level     Bathroom Shower/Tub: Chief Strategy Officer: Standard     Home Equipment: None   Additional Comments: has a small dog      Prior Functioning/Environment Prior Level of Function : Independent/Modified Independent;Driving;Working/employed             Mobility Comments: indep ADLs Comments: works as a Teacher, 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 List: Decreased strength;Decreased range of motion;Decreased activity tolerance;Impaired balance (sitting and/or standing);Decreased safety awareness;Decreased knowledge of use of DME or AE;Pain   OT Treatment/Interventions: Self-care/ADL training;Therapeutic exercise;DME and/or AE instruction;Therapeutic activities;Balance training;Patient/family education      OT Goals(Current goals can be found in the care plan section)   Acute Rehab OT Goals Patient Stated Goal: home OT Goal Formulation: With patient Time For Goal Achievement: 06/04/24 Potential to Achieve Goals: Good ADL Goals Additional ADL Goal #1: pt will complete ADLs with mod I while maintaining spinal precautions   OT Frequency:  Min 2X/week       AM-PAC OT 6 Clicks Daily Activity     Outcome Measure Help from another person eating meals?: None Help from another person taking care of personal grooming?: A Little Help from another person toileting, which includes using toliet, bedpan, or urinal?: A Little Help from another  person bathing (including washing, rinsing, drying)?: A Little Help from another person to put on and taking off regular upper body clothing?: A Little Help from another person to put on and taking off regular lower body clothing?: A Little 6 Click Score: 19   End of Session Equipment Utilized During Treatment: Back brace Nurse Communication: Mobility status  Activity Tolerance: Patient tolerated treatment well Patient left: in chair;with call bell/phone within reach  OT Visit Diagnosis: Unsteadiness on feet (R26.81);Other abnormalities of gait and mobility (R26.89);Muscle weakness (generalized) (M62.81);Pain                Time: 9172-9150 OT Time Calculation (min): 22 min Charges:  OT General Charges $OT Visit: 1 Visit OT Evaluation $OT Eval Moderate Complexity: 1 Mod  Lucie Kendall, OTR/L Acute Rehabilitation Services Office [phone] Secure Chat Communication Preferred   Lucie JONETTA Kendall 05/21/2024, 9:54 AM

## 2024-05-21 NOTE — Discharge Summary (Signed)
 "  Patient ID: FORREST DEMURO MRN: [id number] DOB/AGE: 12-22-1970 53 y.o.  Admit date: 05/20/2024 Discharge date: 05/21/2024  Admission Diagnoses:  Principal Problem:   S/P lumbar fusion   Discharge Diagnoses:  Principal Problem:   S/P lumbar fusion  status post Procedures: ANTERIOR LUMBAR INTERBODY FUSION OF LUMBAR FIVE TO SCARAL ONE POSTERIOR LUMBAR INTERBODY FUSION OF LUMBAR FIVE TO SACRAL ONE ABDOMINAL EXPOSURE FOR ANTERIOR LUMBAR SPINE SURGERY  Past Medical History:  Diagnosis Date   Arthritis    Back pain    Bronchitis    CHF (congestive heart failure) (HCC)    Chronic back pain    Gout    High cholesterol    Joint pain    Pneumonia    Pre-diabetes    Sleep apnea     Surgeries: Procedures: ANTERIOR LUMBAR INTERBODY FUSION OF LUMBAR FIVE TO SCARAL ONE POSTERIOR LUMBAR INTERBODY FUSION OF LUMBAR FIVE TO SACRAL ONE ABDOMINAL EXPOSURE FOR ANTERIOR LUMBAR SPINE SURGERY on 05/20/2024   Consultants: Treatment Team:  Magda Debby SAILOR, MD  Discharged Condition: Improved  Hospital Course: SOWMYA PARTRIDGE is an 53 y.o. female who was admitted 05/20/2024 for operative treatment of S/P lumbar fusion. Patient failed conservative treatments (please see the history and physical for the specifics) and had severe unremitting pain that affects sleep, daily activities and work/hobbies. After pre-op clearance, the patient was taken to the operating room on 05/20/2024 and underwent  Procedures: ANTERIOR LUMBAR INTERBODY FUSION OF LUMBAR FIVE TO SCARAL ONE POSTERIOR LUMBAR INTERBODY FUSION OF LUMBAR FIVE TO SACRAL ONE ABDOMINAL EXPOSURE FOR ANTERIOR LUMBAR SPINE SURGERY.    Patient was given perioperative antibiotics:  Anti-infectives (From admission, onward)    Start     Dose/Rate Route Frequency Ordered Stop   05/20/24 1800  ceFAZolin  (ANCEF ) IVPB 1 g/50 mL premix        1 g 100 mL/hr over 30 Minutes Intravenous Every 8 hours 05/20/24 1650 05/21/24 0359   05/20/24 0601   ceFAZolin  (ANCEF ) IVPB 2g/100 mL premix        2 g 200 mL/hr over 30 Minutes Intravenous 30 min pre-op 05/20/24 0601 05/20/24 1226        Patient was given sequential compression devices and early ambulation to prevent DVT.   Patient benefited maximally from hospital stay and there were no complications. At the time of discharge, the patient was urinating/moving their bowels without difficulty, tolerating a regular diet, pain is controlled with oral pain medications and they have been cleared by PT/OT.   Recent vital signs: Patient Vitals for the past 24 hrs:  BP Temp Temp src Pulse Resp SpO2  05/21/24 0615 -- (!) 101.5 F (38.6 C) Oral -- -- --  05/21/24 0415 (!) 104/57 (!) 101.6 F (38.7 C) Oral 73 18 98 %  05/20/24 2356 132/70 (!) 100.9 F (38.3 C) Oral 75 20 96 %  05/20/24 2020 (!) 111/54 100.2 F (37.9 C) Oral 81 20 93 %  05/20/24 1630 (!) 89/76 -- -- 66 18 92 %  05/20/24 1600 139/77 98 F (36.7 C) -- 76 (!) 21 100 %  05/20/24 1545 (!) 144/76 -- -- 74 (!) 24 100 %  05/20/24 1530 (!) 187/79 -- -- 74 (!) 24 100 %  05/20/24 1515 (!) 183/80 -- -- 61 13 93 %  05/20/24 1500 (!) 187/78 -- -- 73 16 95 %  05/20/24 1445 (!) 164/75 98 F (36.7 C) -- 77 15 (!) 88 %  05/20/24 1430 (!) 160/69 -- --  80 (!) 23 91 %  05/20/24 1415 (!) 176/77 -- -- 82 17 92 %  05/20/24 1403 (!) 156/93 -- -- 74 18 100 %     Recent laboratory studies:  Recent Labs    05/19/24 0900 05/20/24 1954  WBC 8.9 12.2*  HGB 15.9* 13.2  HCT 50.3* 41.8  PLT 284 257  NA 144  --   K 3.5  --   CL 108  --   CO2 26  --   BUN 7  --   CREATININE 0.89 1.02*  GLUCOSE 93  --   CALCIUM  9.5  --      Discharge Medications:   Allergies as of 05/21/2024       Reactions   Atorvastatin  Calcium     Other Reaction(s): cramps   Simvastatin    Other Reaction(s): cramps, Not available   Hydrocodone  Itching   Percocet [oxycodone -acetaminophen ] Itching   Vicodin [hydrocodone -acetaminophen ] Itching   Tolerates with  Benadryl . Tolerates acetaminophen  alone.        Medication List     STOP taking these medications    aspirin  81 MG chewable tablet   benzonatate  200 MG capsule Commonly known as: TESSALON    celecoxib  200 MG capsule Commonly known as: CELEBREX    diclofenac  Sodium 1 % Gel Commonly known as: VOLTAREN    multivitamin with minerals Tabs tablet   nicotine  polacrilex 4 MG gum Commonly known as: Nicorette    tiZANidine  4 MG tablet Commonly known as: ZANAFLEX    traMADol  50 MG tablet Commonly known as: ULTRAM        TAKE these medications    Accu-Chek Softclix Lancets lancets daily.   albuterol  108 (90 Base) MCG/ACT inhaler Commonly known as: VENTOLIN  HFA Inhale 2 puffs into the lungs every 4 (four) hours while awake for 3 days, THEN 2 puffs every 4 (four) hours as needed for wheezing or shortness of breath. Take your inhaler OR your nebulizer.. Start taking on: September 03, 2021 What changed: Another medication with the same name was removed. Continue taking this medication, and follow the directions you see here.   empagliflozin  10 MG Tabs tablet Commonly known as: Jardiance  Take 1 tablet (10 mg total) by mouth daily before breakfast.   enoxaparin  40 MG/0.4ML injection Commonly known as: LOVENOX  Inject 0.4 mLs (40 mg total) into the skin daily. 10 day supply 1 injection per day   fluticasone  50 MCG/ACT nasal spray Commonly known as: FLONASE  Place 2 sprays into both nostrils daily as needed for allergies.   furosemide  40 MG tablet Commonly known as: LASIX  Take 1 tablet (40 mg total) by mouth daily.   gabapentin  300 MG capsule Commonly known as: NEURONTIN  TAKE 1 CAPSULE(300 MG) BY MOUTH THREE TIMES DAILY   methocarbamol  500 MG tablet Commonly known as: ROBAXIN  Take 1 tablet (500 mg total) by mouth every 8 (eight) hours as needed for up to 5 days for muscle spasms.   Mounjaro 5 MG/0.5ML Pen Generic drug: tirzepatide Inject 5 mg into the skin once a week.    ondansetron  4 MG tablet Commonly known as: Zofran  Take 1 tablet (4 mg total) by mouth every 8 (eight) hours as needed for nausea or vomiting.   OneTouch Verio Flex System w/Device Kit SMARTSIG:injection Daily   OneTouch Verio test strip Generic drug: glucose blood SMARTSIG:Via Meter   oxyCODONE -acetaminophen  10-325 MG tablet Commonly known as: Percocet Take 1 tablet by mouth every 6 (six) hours as needed for up to 5 days for pain.   polyethylene glycol-electrolytes 420 g  solution Commonly known as: NuLYTELY  as directed.   potassium chloride  SA 20 MEQ tablet Commonly known as: KLOR-CON  M Take 1 tablet (20 mEq total) by mouth daily.   rosuvastatin  20 MG tablet Commonly known as: CRESTOR  Take 1 tablet (20 mg total) by mouth daily.   sertraline  100 MG tablet Commonly known as: ZOLOFT  Take 150 mg by mouth at bedtime.        Diagnostic Studies: DG Lumbar Spine 2-3 Views Result Date: 05/20/2024 CLINICAL DATA:  Elective surgery. EXAM: LUMBAR SPINE - 2-3 VIEW COMPARISON:  08/14/2022 FINDINGS: Five fluoroscopic spot views of the lumbar spine submitted from the operating room. Anterior and posterior fusion at L5-S1. Fluoroscopy time 182.1 seconds, dose 141.03 mGy. IMPRESSION: Intraoperative fluoroscopy during lumbar fusion. Electronically Signed   By: Andrea Gasman M.D.   On: 05/20/2024 14:33   DG C-Arm 1-60 Min-No Report Result Date: 05/20/2024 Fluoroscopy was utilized by the requesting physician.  No radiographic interpretation.   DG C-Arm 1-60 Min-No Report Result Date: 05/20/2024 Fluoroscopy was utilized by the requesting physician.  No radiographic interpretation.   DG C-Arm 1-60 Min-No Report Result Date: 05/20/2024 Fluoroscopy was utilized by the requesting physician.  No radiographic interpretation.   DG C-Arm 1-60 Min-No Report Result Date: 05/20/2024 Fluoroscopy was utilized by the requesting physician.  No radiographic interpretation.   DG C-Arm 1-60 Min-No  Report Result Date: 05/20/2024 Fluoroscopy was utilized by the requesting physician.  No radiographic interpretation.   DG OR LOCAL ABDOMEN Result Date: 05/20/2024 CLINICAL DATA:  Status post surgical fusion of L5-S1 EXAM: OR LOCAL ABDOMEN COMPARISON:  Oct 24, 2008 FINDINGS: Status post surgical anterior fusion of L5-S1. No other definite radiopaque foreign body is noted. IMPRESSION: Status post surgical anterior fusion of L5-S1. No other definite radiopaque foreign body is noted. These results were called by telephone at the time of interpretation on 05/20/2024 at 11:34 am to OR 4, who verbally acknowledged these results. Electronically Signed   By: Lynwood Landy Raddle M.D.   On: 05/20/2024 11:35       Follow-up Information     Burnetta Aures, MD. Schedule an appointment as soon as possible for a visit in 2 week(s).   Specialty: Orthopedic Surgery Why: If symptoms worsen, For suture removal, For wound re-check Contact information: [address] [place] [address] [phone]                 Discharge Plan:  discharge to home   Disposition: Marcie is a very pleasant 53 year old female who is POD 1 from ALIF L5-S1 with PSFI L5-S1. Surgical intervention was successful and without complications. Her hospital course has been uncomplicated. She states that her pre-operative leg pain is much improved since surgery. She is ambulating on her own. She is tolerating oral intake well. She is compliant with the LSO brace. She is complaint with the incentive spirometer. She reports that her pain is well controled with oral pain medications. Dressing is c/d/I.  Positive void, positive flatus, positive BM. Patient will follow up with us  in clinic in 2 weeks. Patient understands that she cannot shower for the first 5 days post-op. All questions were welcomed and addressed.     Signed: Jeoffrey LOISE Sages PA-C for Dr. Aures Burnetta, MD [facility] [phone] 05/21/2024,  6:58 AM      "

## 2024-05-21 NOTE — Evaluation (Signed)
 Physical Therapy Evaluation  Patient Details Name: Kelsey Morgan MRN: [id number] DOB: Jan 11, 1971 Today's Date: 05/21/2024  History of Present Illness  Pt is a 53 year old female who presents s/p ALIF L5-S1 with PSIF L5-S1 on 05/20/2024. PMH arthritis, back pain, CHF, gout, high cholesterol, PNA, sleep apnea  Clinical Impression  Pt admitted with above diagnosis. At the time of PT eval, pt was able to demonstrate transfers and ambulation with gross modified independence to supervision for safety and RW for support. Pt was educated on precautions, brace application/wearing schedule, appropriate activity progression, and car transfer. Pt currently with functional limitations due to the deficits listed below (see PT Problem List). Pt will benefit from skilled PT to increase their independence and safety with mobility to allow discharge to the venue listed below.          If plan is discharge home, recommend the following: A little help with walking and/or transfers;A little help with bathing/dressing/bathroom;Assistance with cooking/housework;Assist for transportation;Help with stairs or ramp for entrance   Can travel by private vehicle        Equipment Recommendations Rolling walker (2 wheels);BSC/3in1  Recommendations for Other Services       Functional Status Assessment Patient has had a recent decline in their functional status and demonstrates the ability to make significant improvements in function in a reasonable and predictable amount of time.     Precautions / Restrictions Precautions Precautions: Fall;Back Precaution Booklet Issued: Yes (comment) Recall of Precautions/Restrictions: Intact Precaution/Restrictions Comments: Reviewed handout and pt was cued for precautios during functional mobility. Required Braces or Orthoses: Spinal Brace Spinal Brace: Lumbar corset;Applied in sitting position Restrictions Weight Bearing Restrictions Per Provider Order: No       Mobility  Bed Mobility Overal bed mobility: Modified Independent             General bed mobility comments: HOB flat and rails lowered to simulate home environment. No assist required.    Transfers Overall transfer level: Modified independent Equipment used: Rolling walker (2 wheels)               General transfer comment: No assist required.    Ambulation/Gait Ambulation/Gait assistance: Supervision Gait Distance (Feet): 200 Feet Assistive device: Rolling walker (2 wheels) Gait Pattern/deviations: Step-through pattern, Decreased stride length, Trunk flexed Gait velocity: Decreased Gait velocity interpretation: <1.31 ft/sec, indicative of household ambulator   General Gait Details: VC's for improved posture, closer walker proximity and forward gaze. No gross unsteadiness or LOB noted.  Stairs Stairs:  (Pt declines stair training. Reports she practiced earlier today and feels comfortable.)          Wheelchair Mobility     Tilt Bed    Modified Rankin (Stroke Patients Only)       Balance Overall balance assessment: Needs assistance Sitting-balance support: Feet supported Sitting balance-Leahy Scale: Good     Standing balance support: Single extremity supported, During functional activity Standing balance-Leahy Scale: Fair                               Pertinent Vitals/Pain Pain Assessment Pain Assessment: Faces Faces Pain Scale: Hurts even more Pain Location: back Pain Descriptors / Indicators: Discomfort Pain Intervention(s): Limited activity within patient's tolerance, Monitored during session, Repositioned    Home Living Family/patient expects to be discharged to:: Private residence Living Arrangements: Spouse/significant other Available Help at Discharge: Family;Available PRN/intermittently Type of Home: Apartment Home Access: Stairs to enter  Entrance Stairs-Rails: Right Entrance Stairs-Number of Steps: 14   Home Layout:  One level Home Equipment: None Additional Comments: has a small dog    Prior Function Prior Level of Function : Independent/Modified Independent;Driving;Working/employed             Mobility Comments: indep 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 Extremity Assessment: Overall WFL for tasks assessed    Lower Extremity Assessment Lower Extremity Assessment: Generalized weakness    Cervical / Trunk Assessment Cervical / Trunk Assessment: Back Surgery  Communication   Communication Communication: No apparent difficulties    Cognition Arousal: Alert Behavior During Therapy: WFL for tasks assessed/performed                             Following commands: Intact       Cueing Cueing Techniques: Verbal cues     General Comments      Exercises     Assessment/Plan    PT Assessment Patient needs continued PT services  PT Problem List Decreased strength;Decreased range of motion;Decreased activity tolerance;Decreased balance;Decreased mobility;Decreased knowledge of use of DME;Decreased safety awareness;Decreased knowledge of precautions;Pain       PT Treatment Interventions DME instruction;Gait training;Functional mobility training;Stair training;Therapeutic activities;Therapeutic exercise;Balance training;Patient/family education    PT Goals (Current goals can be found in the Care Plan section)  Acute Rehab PT Goals Patient Stated Goal: Home today PT Goal Formulation: With patient Time For Goal Achievement: 05/28/24 Potential to Achieve Goals: Good    Frequency Min 5X/week     Co-evaluation               AM-PAC PT 6 Clicks Mobility  Outcome Measure Help needed turning from your back to your side while in a flat bed without using bedrails?: None Help needed moving from lying on your back to sitting on the side of a flat bed without using bedrails?: None Help  needed moving to and from a bed to a chair (including a wheelchair)?: A Little Help needed standing up from a chair using your arms (e.g., wheelchair or bedside chair)?: None Help needed to walk in hospital room?: A Little Help needed climbing 3-5 steps with a railing? : A Little 6 Click Score: 21    End of Session Equipment Utilized During Treatment: Gait belt Activity Tolerance: Patient tolerated treatment well Patient left: in bed;with call bell/phone within reach;with family/visitor present Nurse Communication: Mobility status PT Visit Diagnosis: Unsteadiness on feet (R26.81);Pain Pain - part of body:  (back)    Time: 8891-8871 PT Time Calculation (min) (ACUTE ONLY): 20 min   Charges:   PT Evaluation $PT Eval Low Complexity: 1 Low   PT General Charges $$ ACUTE PT VISIT: 1 Visit         Leita Sable, PT, DPT Acute Rehabilitation Services Secure Chat Preferred Office: [phone]   Leita JONETTA Sable 05/21/2024, 1:16 PM

## 2024-05-21 NOTE — Plan of Care (Signed)
  Problem: Education: Goal: Knowledge of General Education information will improve Description: Including pain rating scale, medication(s)/side effects and non-pharmacologic comfort measures Outcome: Completed/Met   Problem: Health Behavior/Discharge Planning: Goal: Ability to manage health-related needs will improve Outcome: Completed/Met   Problem: Clinical Measurements: Goal: Ability to maintain clinical measurements within normal limits will improve Outcome: Completed/Met Goal: Will remain free from infection Outcome: Completed/Met Goal: Diagnostic test results will improve Outcome: Completed/Met Goal: Respiratory complications will improve Outcome: Completed/Met Goal: Cardiovascular complication will be avoided Outcome: Completed/Met   Problem: Activity: Goal: Risk for activity intolerance will decrease Outcome: Completed/Met   Problem: Nutrition: Goal: Adequate nutrition will be maintained Outcome: Completed/Met   Problem: Coping: Goal: Level of anxiety will decrease Outcome: Completed/Met   Problem: Elimination: Goal: Will not experience complications related to bowel motility Outcome: Completed/Met Goal: Will not experience complications related to urinary retention Outcome: Completed/Met   Problem: Pain Managment: Goal: General experience of comfort will improve and/or be controlled Outcome: Completed/Met   Problem: Safety: Goal: Ability to remain free from injury will improve Outcome: Completed/Met   Problem: Skin Integrity: Goal: Risk for impaired skin integrity will decrease Outcome: Completed/Met   Problem: Education: Goal: Ability to describe self-care measures that may prevent or decrease complications (Diabetes Survival Skills Education) will improve Outcome: Completed/Met Goal: Individualized Educational Video(s) Outcome: Completed/Met   Problem: Coping: Goal: Ability to adjust to condition or change in health will improve Outcome:  Completed/Met   Problem: Fluid Volume: Goal: Ability to maintain a balanced intake and output will improve Outcome: Completed/Met   Problem: Health Behavior/Discharge Planning: Goal: Ability to identify and utilize available resources and services will improve Outcome: Completed/Met Goal: Ability to manage health-related needs will improve Outcome: Completed/Met   Problem: Metabolic: Goal: Ability to maintain appropriate glucose levels will improve Outcome: Completed/Met   Problem: Nutritional: Goal: Maintenance of adequate nutrition will improve Outcome: Completed/Met Goal: Progress toward achieving an optimal weight will improve Outcome: Completed/Met   Problem: Skin Integrity: Goal: Risk for impaired skin integrity will decrease Outcome: Completed/Met   Problem: Tissue Perfusion: Goal: Adequacy of tissue perfusion will improve Outcome: Completed/Met   Problem: Education: Goal: Ability to verbalize activity precautions or restrictions will improve Outcome: Completed/Met Goal: Knowledge of the prescribed therapeutic regimen will improve Outcome: Completed/Met Goal: Understanding of discharge needs will improve Outcome: Completed/Met   Problem: Activity: Goal: Ability to avoid complications of mobility impairment will improve Outcome: Completed/Met Goal: Ability to tolerate increased activity will improve Outcome: Completed/Met Goal: Will remain free from falls Outcome: Completed/Met   Problem: Bowel/Gastric: Goal: Gastrointestinal status for postoperative course will improve Outcome: Completed/Met   Problem: Clinical Measurements: Goal: Ability to maintain clinical measurements within normal limits will improve Outcome: Completed/Met Goal: Postoperative complications will be avoided or minimized Outcome: Completed/Met Goal: Diagnostic test results will improve Outcome: Completed/Met   Problem: Pain Management: Goal: Pain level will decrease Outcome:  Completed/Met   Problem: Skin Integrity: Goal: Will show signs of wound healing Outcome: Completed/Met   Problem: Health Behavior/Discharge Planning: Goal: Identification of resources available to assist in meeting health care needs will improve Outcome: Completed/Met   Problem: Bladder/Genitourinary: Goal: Urinary functional status for postoperative course will improve Outcome: Completed/Met

## 2024-05-21 NOTE — Progress Notes (Signed)
" ° ° °  Subjective: Procedures (LRB): ANTERIOR LUMBAR INTERBODY FUSION OF LUMBAR FIVE TO SCARAL ONE (N/A) POSTERIOR LUMBAR INTERBODY FUSION OF LUMBAR FIVE TO SACRAL ONE (N/A) ABDOMINAL EXPOSURE FOR ANTERIOR LUMBAR SPINE SURGERY (N/A) 1 Day Post-Op  Patient reports pain as moderate.  Reports decreased leg pain reports incisional back pain  and abdominal pain from anterior incision  Positive void Positive bowel movement Positive flatus Negative chest pain or shortness of breath  Objective: Vital signs in last 24 hours: Temp:  [98 F (36.7 C)-101.6 F (38.7 C)] 101.5 F (38.6 C) (12/19 0615) Pulse Rate:  [61-82] 73 (12/19 0415) Resp:  [13-24] 18 (12/19 0415) BP: (89-187)/(54-93) 104/57 (12/19 0415) SpO2:  [88 %-100 %] 98 % (12/19 0415)  Intake/Output from previous day: 12/18 0701 - 12/19 0700 In: 3176 [P.O.:1256; I.V.:1700; IV Piggyback:220] Out: 800 [Urine:550; Blood:250]  Labs: Recent Labs    05/19/24 0900 05/20/24 1954  WBC 8.9 12.2*  RBC 5.81* 4.82  HCT 50.3* 41.8  PLT 284 257   Recent Labs    05/19/24 0900 05/20/24 1954  NA 144  --   K 3.5  --   CL 108  --   CO2 26  --   BUN 7  --   CREATININE 0.89 1.02*  GLUCOSE 93  --   CALCIUM  9.5  --    No results for input(s): LABPT, INR in the last 72 hours.  Physical Exam: Sensation intact distally Intact pulses distally Dorsiflexion/Plantar flexion intact Incision: dressing C/D/I No cellulitis present Compartment soft Body mass index is 36.25 kg/m.   Assessment/Plan: Amira is a very pleasant 53 year old female who is POD 1 from ALIF L5-S1 with PSFI L5-S1. Surgical intervention was successful and without complications. Her hospital course has been uncomplicated. She states that her pre-operative leg pain is much improved since surgery. She is ambulating on her own. She is tolerating oral intake well. She is compliant with the LSO brace. She is complaint with the incentive spirometer. She reports that her  pain is well controled with oral pain medications. Dressing is c/d/I.  Positive ovid, positive flatus, positive BM. Patient does currently have a fever of 101.5; is currently getting a bolus of fluids, patient is asx, denies chills, n/v. Will continue to monitor her temp.  Plan: Continue Care Continue pain medical management  Continue Incentive spirometry  Continue mobilization with PT Continue to ice surgical site WBAT Continue to monitor temp  Will start Lovenox  injections today--patient education needed; patient will be sent home with lovenox  injections for 10 days  LE bilateral US  Doppler routine for DVT today  Plan to d/c to home today vs./ tomorrow pending PT clearance and patient temp   Jeoffrey Sages PA-C for Dr. Donaciano Sprang, MD [facility] [phone]  "

## 2024-05-21 NOTE — Progress Notes (Signed)
 Patient ID: Kelsey Morgan, female   DOB: 1970-07-30, 53 y.o.   MRN: [id number]  Looks good postop day 1 after ALIF.  No incisional problems on exam.  Feet are warm and well-perfused with palpable pulses.  Normal motor and sensory function in the feet.  Patient has ambulated and passed gas.  Safer discharge from my standpoint.  Follow-up with me as needed.  Debby SAILOR. Magda, MD [facility] Phone Number: [phone] 05/21/2024 8:19 AM

## 2024-05-22 ENCOUNTER — Encounter (HOSPITAL_COMMUNITY): Payer: Self-pay | Admitting: Orthopedic Surgery

## 2024-05-22 DIAGNOSIS — M5136 Other intervertebral disc degeneration, lumbar region with discogenic back pain only: Secondary | ICD-10-CM | POA: Diagnosis not present

## 2024-05-22 LAB — URINALYSIS, ROUTINE W REFLEX MICROSCOPIC
Bilirubin Urine: NEGATIVE
Glucose, UA: >=500 mg/dL — AB
Hgb urine dipstick: NEGATIVE
Ketones, ur: NEGATIVE mg/dL
Leukocytes,Ua: NEGATIVE
Nitrite: NEGATIVE
Protein, ur: NEGATIVE mg/dL
Specific Gravity, Urine: <1.005 — ABNORMAL LOW (ref 1.005–1.030)
pH: 6 (ref 5.0–8.0)

## 2024-05-22 LAB — URINALYSIS, MICROSCOPIC (REFLEX): Bacteria, UA: NONE SEEN

## 2024-05-22 MED ORDER — ACETAMINOPHEN 325 MG PO TABS
650.0000 mg | ORAL_TABLET | ORAL | Status: DC | PRN
  Administered 2024-05-22: 650 mg via ORAL
  Filled 2024-05-22: qty 2

## 2024-05-22 NOTE — Progress Notes (Signed)
 Pt with oral temp of 103 degrees. Previously ordered CXR results now available. IVF resumed. PRN Tylenol  given. Pt continues using the incentive spirometer. On-call MD paged. Orders received for UA. Wil continue to monitor

## 2024-05-22 NOTE — Progress Notes (Signed)
 Patient is discharged from room 3C11 at this time. Alert and in stable condition. IV site d/c'd and instructions read to patient with understanding verbalized and all questions answered. RW and BSC given to patient at bedside. Left unit via wheelchair with all belongings at side.

## 2024-05-22 NOTE — Discharge Summary (Signed)
 "  In most cases prophylactic antibiotics for Dental procdeures after total joint surgery are not necessary.  Exceptions are as follows:  1. History of prior total joint infection  2. Severely immunocompromised (Organ Transplant, cancer chemotherapy, Rheumatoid biologic meds such as Humera)  3. Poorly controlled diabetes (A1C &gt; 8.0, blood glucose over 200)  If you have one of these conditions, contact your surgeon for an antibiotic prescription, prior to your dental procedure. Orthopedic Discharge Summary        Physician Discharge Summary  Patient ID: Kelsey Morgan MRN: [id number] DOB/AGE: 1970-07-27 53 y.o.  Admit date: 05/20/2024 Discharge date: 05/22/2024   Procedures:  Procedures (LRB): ANTERIOR LUMBAR INTERBODY FUSION OF LUMBAR FIVE TO SCARAL ONE (N/A) POSTERIOR LUMBAR INTERBODY FUSION OF LUMBAR FIVE TO SACRAL ONE (N/A) ABDOMINAL EXPOSURE FOR ANTERIOR LUMBAR SPINE SURGERY (N/A)  Attending Physician:  Dr. Burnetta  Admission Diagnoses:   lumbar degenerative disc disease  Discharge Diagnoses:  lumbar degenerative disc diseast   Past Medical History:  Diagnosis Date   Arthritis    Back pain    Bronchitis    CHF (congestive heart failure) (HCC)    Chronic back pain    Gout    High cholesterol    Joint pain    Pneumonia    Pre-diabetes    Sleep apnea     PCP: Sun, Vyvyan, MD   Discharged Condition: good  Hospital Course:  Patient underwent the above stated procedure on 05/20/2024. Patient tolerated the procedure well and brought to the recovery room in good condition and subsequently to the floor. Patient had an uncomplicated hospital course and was stable for discharge.   Disposition: Discharge disposition: 01-Home or Self Care      with follow up in 2 weeks    Follow-up Information     Burnetta Aures, MD. Schedule an appointment as soon as possible for a visit in 2 week(s).   Specialty: Orthopedic Surgery Why: If symptoms worsen, For  suture removal, For wound re-check Contact information: [address] [place] [address] [phone]                 Dental Antibiotics:  In most cases prophylactic antibiotics for Dental procdeures after total joint surgery are not necessary.  Exceptions are as follows:  1. History of prior total joint infection  2. Severely immunocompromised (Organ Transplant, cancer chemotherapy, Rheumatoid biologic meds such as Humera)  3. Poorly controlled diabetes (A1C &gt; 8.0, blood glucose over 200)  If you have one of these conditions, contact your surgeon for an antibiotic prescription, prior to your dental procedure.  Discharge Instructions     Call MD / Call 911   Complete by: As directed    If you experience chest pain or shortness of breath, CALL 911 and be transported to the hospital emergency room.  If you develope a fever above 101 F, pus (white drainage) or increased drainage or redness at the wound, or calf pain, call your surgeon's office.   Constipation Prevention   Complete by: As directed    Drink plenty of fluids.  Prune juice may be helpful.  You may use a stool softener, such as Colace (over the counter) 100 mg twice a day.  Use MiraLax  (over the counter) for constipation as needed.   Increase activity slowly as tolerated   Complete by: As directed    Post-operative opioid taper instructions:   Complete by: As directed    POST-OPERATIVE OPIOID TAPER INSTRUCTIONS: It  is important to wean off of your opioid medication as soon as possible. If you do not need pain medication after your surgery it is ok to stop day one. Opioids include: Codeine, Hydrocodone (Norco, Vicodin), Oxycodone (Percocet, oxycontin ) and hydromorphone  amongst others.  Long term and even short term use of opiods can cause: Increased pain response Dependence Constipation Depression Respiratory depression And more.  Withdrawal symptoms can include Flu like  symptoms Nausea, vomiting And more Techniques to manage these symptoms Hydrate well Eat regular healthy meals Stay active Use relaxation techniques(deep breathing, meditating, yoga) Do Not substitute Alcohol to help with tapering If you have been on opioids for less than two weeks and do not have pain than it is ok to stop all together.  Plan to wean off of opioids This plan should start within one week post op of your joint replacement. Maintain the same interval or time between taking each dose and first decrease the dose.  Cut the total daily intake of opioids by one tablet each day Next start to increase the time between doses. The last dose that should be eliminated is the evening dose.          Allergies as of 05/22/2024       Reactions   Atorvastatin  Calcium     Other Reaction(s): cramps   Simvastatin    Other Reaction(s): cramps, Not available   Hydrocodone  Itching   Percocet [oxycodone -acetaminophen ] Itching   Vicodin [hydrocodone -acetaminophen ] Itching   Tolerates with Benadryl . Tolerates acetaminophen  alone.        Medication List     STOP taking these medications    aspirin  81 MG chewable tablet   benzonatate  200 MG capsule Commonly known as: TESSALON    celecoxib  200 MG capsule Commonly known as: CELEBREX    diclofenac  Sodium 1 % Gel Commonly known as: VOLTAREN    multivitamin with minerals Tabs tablet   nicotine  polacrilex 4 MG gum Commonly known as: Nicorette    tiZANidine  4 MG tablet Commonly known as: ZANAFLEX    traMADol  50 MG tablet Commonly known as: ULTRAM        TAKE these medications    Accu-Chek Softclix Lancets lancets daily.   albuterol  108 (90 Base) MCG/ACT inhaler Commonly known as: VENTOLIN  HFA Inhale 2 puffs into the lungs every 4 (four) hours while awake for 3 days, THEN 2 puffs every 4 (four) hours as needed for wheezing or shortness of breath. Take your inhaler OR your nebulizer.. Start taking on: September 03, 2021 What  changed: Another medication with the same name was removed. Continue taking this medication, and follow the directions you see here.   empagliflozin  10 MG Tabs tablet Commonly known as: Jardiance  Take 1 tablet (10 mg total) by mouth daily before breakfast.   enoxaparin  40 MG/0.4ML injection Commonly known as: LOVENOX  Inject 0.4 mLs (40 mg total) into the skin daily. 10 day supply 1 injection per day   fluticasone  50 MCG/ACT nasal spray Commonly known as: FLONASE  Place 2 sprays into both nostrils daily as needed for allergies.   furosemide  40 MG tablet Commonly known as: LASIX  Take 1 tablet (40 mg total) by mouth daily.   gabapentin  300 MG capsule Commonly known as: NEURONTIN  TAKE 1 CAPSULE(300 MG) BY MOUTH THREE TIMES DAILY   methocarbamol  500 MG tablet Commonly known as: ROBAXIN  Take 1 tablet (500 mg total) by mouth every 8 (eight) hours as needed for up to 5 days for muscle spasms.   Mounjaro 5 MG/0.5ML Pen Generic drug: tirzepatide Inject 5 mg into the  skin once a week.   ondansetron  4 MG tablet Commonly known as: Zofran  Take 1 tablet (4 mg total) by mouth every 8 (eight) hours as needed for nausea or vomiting.   OneTouch Verio Flex System w/Device Kit SMARTSIG:injection Daily   OneTouch Verio test strip Generic drug: glucose blood SMARTSIG:Via Meter   oxyCODONE -acetaminophen  10-325 MG tablet Commonly known as: Percocet Take 1 tablet by mouth every 6 (six) hours as needed for up to 5 days for pain.   polyethylene glycol-electrolytes 420 g solution Commonly known as: NuLYTELY  as directed.   potassium chloride  SA 20 MEQ tablet Commonly known as: KLOR-CON  M Take 1 tablet (20 mEq total) by mouth daily.   rosuvastatin  20 MG tablet Commonly known as: CRESTOR  Take 1 tablet (20 mg total) by mouth daily.   sertraline  100 MG tablet Commonly known as: ZOLOFT  Take 150 mg by mouth at bedtime.          Signed: Debby KATHEE Fireman 05/22/2024, 8:10 AM  [facility] [address], [address], [place], [place] [address] Phone: [phone] Facebook  Instagram  LinkedIn  Twitter    "

## 2024-05-22 NOTE — Progress Notes (Signed)
" ° °  Subjective: 2 Days Post-Op Procedures (LRB): ANTERIOR LUMBAR INTERBODY FUSION OF LUMBAR FIVE TO SCARAL ONE (N/A) POSTERIOR LUMBAR INTERBODY FUSION OF LUMBAR FIVE TO SACRAL ONE (N/A) ABDOMINAL EXPOSURE FOR ANTERIOR LUMBAR SPINE SURGERY (N/A)  Pt feeling better today Still having episodes of elevated temp with negative UA  No cough or wheeze that would suggest pneumonia Pt feels ready for d/c Patient reports pain as mild.  Objective:   VITALS:   Vitals:   05/22/24 0637 05/22/24 0741  BP:  (!) 119/44  Pulse:  81  Resp:  18  Temp: 100 F (37.8 C) 99.2 F (37.3 C)  SpO2:  95%    Lumbar incision healing well Dressing intact Nv intact distally No rashes or edema   LABS Recent Labs    05/19/24 0900 05/20/24 1954  HGB 15.9* 13.2  HCT 50.3* 41.8  WBC 8.9 12.2*  PLT 284 257    Recent Labs    05/19/24 0900 05/20/24 1954  NA 144  --   K 3.5  --   BUN 7  --   CREATININE 0.89 1.02*  GLUCOSE 93  --      Assessment/Plan: 2 Days Post-Op Procedures (LRB): ANTERIOR LUMBAR INTERBODY FUSION OF LUMBAR FIVE TO SCARAL ONE (N/A) POSTERIOR LUMBAR INTERBODY FUSION OF LUMBAR FIVE TO SACRAL ONE (N/A) ABDOMINAL EXPOSURE FOR ANTERIOR LUMBAR SPINE SURGERY (N/A) Recommend continued pulmonary toilet D/c home today F/u in the office in 2 weeks Pain management as needed     Brad Melvenia RIGGERS, MPAS [facility] is now [facility] [address], [place], [place] [address] Phone: [phone] [email or web address] Facebook  [employer]      "

## 2024-05-22 NOTE — Progress Notes (Signed)
 Physical Therapy Treatment  Patient Details Name: Kelsey Morgan MRN: [id number] DOB: 1970-07-31 Today's Date: 05/22/2024   History of Present Illness Pt is a 53 year old female who presents s/p ALIF L5-S1 with PSIF L5-S1 on 05/20/2024. PMH arthritis, back pain, CHF, gout, high cholesterol, PNA, sleep apnea    PT Comments  Pt progressing well with post-op mobility. She was able to demonstrate transfers and ambulation with gross modified independence to supervision for safety. Pt not utilizing RW in room however using in hallway for pain control during longer distance ambulation. Reinforced education on precautions, brace application/wearing schedule, appropriate activity progression, and car transfer. Will continue to follow.     If plan is discharge home, recommend the following: A little help with walking and/or transfers;A little help with bathing/dressing/bathroom;Assistance with cooking/housework;Assist for transportation;Help with stairs or ramp for entrance   Can travel by private vehicle        Equipment Recommendations  Rolling walker (2 wheels);BSC/3in1    Recommendations for Other Services       Precautions / Restrictions Precautions Precautions: Fall;Back Precaution Booklet Issued: Yes (comment) Recall of Precautions/Restrictions: Intact Precaution/Restrictions Comments: Reviewed handout and pt was cued for precautios during functional mobility. Required Braces or Orthoses: Spinal Brace Spinal Brace: Lumbar corset;Applied in sitting position Restrictions Weight Bearing Restrictions Per Provider Order: No     Mobility  Bed Mobility               General bed mobility comments: Pt was received standing in room putting on brace    Transfers Overall transfer level: Modified independent                 General transfer comment: No assist required.    Ambulation/Gait Ambulation/Gait assistance: Supervision Gait Distance (Feet): 200 Feet Assistive  device: Rolling walker (2 wheels) Gait Pattern/deviations: Step-through pattern, Decreased stride length, Trunk flexed Gait velocity: Decreased Gait velocity interpretation: <1.31 ft/sec, indicative of household ambulator   General Gait Details: VC's for improved posture, closer walker proximity and forward gaze. Slow and guarded due to pain but overall steady.   Stairs             Wheelchair Mobility     Tilt Bed    Modified Rankin (Stroke Patients Only)       Balance Overall balance assessment: Needs assistance Sitting-balance support: Feet supported Sitting balance-Leahy Scale: Good     Standing balance support: Single extremity supported, During functional activity Standing balance-Leahy Scale: Fair                              Hotel Manager: No apparent difficulties  Cognition Arousal: Alert Behavior During Therapy: WFL for tasks assessed/performed   PT - Cognitive impairments: No apparent impairments                         Following commands: Intact      Cueing Cueing Techniques: Verbal cues  Exercises      General Comments        Pertinent Vitals/Pain Pain Assessment Pain Assessment: Faces Faces Pain Scale: Hurts even more Pain Location: back Pain Descriptors / Indicators: Operative site guarding, Sore Pain Intervention(s): Limited activity within patient's tolerance, Monitored during session, Repositioned    Home Living Family/patient expects to be discharged to:: Private residence Living Arrangements: Spouse/significant other  Prior Function            PT Goals (current goals can now be found in the care plan section) Acute Rehab PT Goals Patient Stated Goal: Home today PT Goal Formulation: With patient Time For Goal Achievement: 05/28/24 Potential to Achieve Goals: Good Progress towards PT goals: Progressing toward goals    Frequency    Min  5X/week      PT Plan      Co-evaluation              AM-PAC PT 6 Clicks Mobility   Outcome Measure  Help needed turning from your back to your side while in a flat bed without using bedrails?: None Help needed moving from lying on your back to sitting on the side of a flat bed without using bedrails?: None Help needed moving to and from a bed to a chair (including a wheelchair)?: A Little Help needed standing up from a chair using your arms (e.g., wheelchair or bedside chair)?: None Help needed to walk in hospital room?: A Little Help needed climbing 3-5 steps with a railing? : A Little 6 Click Score: 21    End of Session Equipment Utilized During Treatment: Gait belt Activity Tolerance: Patient tolerated treatment well Patient left: in bed;with call bell/phone within reach;with family/visitor present Nurse Communication: Mobility status PT Visit Diagnosis: Unsteadiness on feet (R26.81);Pain Pain - part of body:  (back)     Time: 9163-9153 PT Time Calculation (min) (ACUTE ONLY): 10 min  Charges:    $Gait Training: 8-22 mins PT General Charges $$ ACUTE PT VISIT: 1 Visit                     Kelsey Morgan, PT, DPT Acute Rehabilitation Services Secure Chat Preferred Office: [phone]    Kelsey Morgan 05/22/2024, 9:02 AM

## 2024-05-22 NOTE — Progress Notes (Signed)
 Occupational Therapy Treatment Patient Details Name: Kelsey Morgan MRN: [id number] DOB: 06-30-1970 Today's Date: 05/22/2024   History of present illness Pt is a 53 year old female who presents s/p ALIF L5-S1 with PSIF L5-S1 on 05/20/2024. PMH arthritis, back pain, CHF, gout, high cholesterol, PNA, sleep apnea   OT comments  Patient handed off from PT in the hallway, with OT reviewing lower body dressing, toilet transfers and car transfers. Patient remains in pain, but is able to ambulate with CGA and was able to complete dressing this morning independently. Patient nauseous at end of session, with RN notified. OT will follow acutely, with recommendation remaining appropriate.       If plan is discharge home, recommend the following:  A little help with walking and/or transfers;A little help with bathing/dressing/bathroom;Assistance with cooking/housework;Assist for transportation   Equipment Recommendations  BSC/3in1;Other (comment) (RW)    Recommendations for Other Services      Precautions / Restrictions Precautions Precautions: Fall;Back Precaution Booklet Issued: Yes (comment) Recall of Precautions/Restrictions: Intact Precaution/Restrictions Comments: Reviewed handout and pt was cued for precautios during functional mobility. Required Braces or Orthoses: Spinal Brace Spinal Brace: Lumbar corset;Applied in sitting position Restrictions Weight Bearing Restrictions Per Provider Order: No       Mobility Bed Mobility               General bed mobility comments: Pt was received standing in hallway from PT    Transfers Overall transfer level: Modified independent                 General transfer comment: No assist required.     Balance Overall balance assessment: Needs assistance Sitting-balance support: Feet supported Sitting balance-Leahy Scale: Good     Standing balance support: Single extremity supported, During functional activity Standing  balance-Leahy Scale: Fair                             ADL either performed or assessed with clinical judgement   ADL Overall ADL's : Needs assistance/impaired                         Toilet Transfer: Contact guard assist;Ambulation           Functional mobility during ADLs: Contact guard assist General ADL Comments: Patient handed off from PT in the hallway, with OT reviewing lower body dressing, toilet transfers and car transfers. Patient remains in pain, but is able to ambulate with CGA and was able to complete dressing this morning independently. Patient nauseous at end of session, with RN notified. OT will follow acutely, with recommendation remaining appropriate.    Extremity/Trunk Assessment              Vision       Restaurant Manager, Fast Food Communication: No apparent difficulties   Cognition Arousal: Alert Behavior During Therapy: WFL for tasks assessed/performed Cognition: No apparent impairments                               Following commands: Intact        Cueing   Cueing Techniques: Verbal cues  Exercises      Shoulder Instructions       General Comments VSS    Pertinent Vitals/ Pain       Pain Assessment Pain Assessment: Faces  Faces Pain Scale: Hurts even more Pain Location: back Pain Descriptors / Indicators: Operative site guarding, Sore Pain Intervention(s): Limited activity within patient's tolerance, Monitored during session, Repositioned  Home Living Family/patient expects to be discharged to:: Private residence Living Arrangements: Spouse/significant other                                      Prior Functioning/Environment              Frequency  Min 2X/week        Progress Toward Goals  OT Goals(current goals can now be found in the care plan section)     Acute Rehab OT Goals Patient Stated Goal: to get better OT Goal Formulation:  With patient Time For Goal Achievement: 06/04/24 Potential to Achieve Goals: Good  Plan      Co-evaluation                 AM-PAC OT 6 Clicks Daily Activity     Outcome Measure   Help from another person eating meals?: None Help from another person taking care of personal grooming?: A Little Help from another person toileting, which includes using toliet, bedpan, or urinal?: A Little Help from another person bathing (including washing, rinsing, drying)?: A Little Help from another person to put on and taking off regular upper body clothing?: A Little Help from another person to put on and taking off regular lower body clothing?: A Little 6 Click Score: 19    End of Session Equipment Utilized During Treatment: Back brace;Rolling walker (2 wheels)  OT Visit Diagnosis: Unsteadiness on feet (R26.81);Other abnormalities of gait and mobility (R26.89);Muscle weakness (generalized) (M62.81);Pain Pain - part of body:  (Back)   Activity Tolerance Patient tolerated treatment well   Patient Left with call bell/phone within reach;in bed (sitting EOB)   Nurse Communication Mobility status        Time: 9152-9142 OT Time Calculation (min): 10 min  Charges: OT General Charges $OT Visit: 1 Visit OT Treatments $Self Care/Home Management : 8-22 mins  Ronal Gift E. Angas Isabell, OTR/L Acute Rehabilitation Services [phone]   Ronal Gift Salt 05/22/2024, 9:51 AM

## 2024-05-24 LAB — GLUCOSE, CAPILLARY: Glucose-Capillary: 113 mg/dL — ABNORMAL HIGH (ref 70–99)
